# Patient Record
Sex: Female | Born: 1945 | Race: White | Hispanic: No | Smoking: Never smoker
Health system: Southern US, Community
[De-identification: ages and names within clinical notes are randomized; demographics above are authoritative.]

## PROBLEM LIST (undated history)

## (undated) DIAGNOSIS — F419 Anxiety disorder, unspecified: Secondary | ICD-10-CM

## (undated) DIAGNOSIS — D759 Disease of blood and blood-forming organs, unspecified: Secondary | ICD-10-CM

## (undated) DIAGNOSIS — Z9889 Other specified postprocedural states: Secondary | ICD-10-CM

## (undated) DIAGNOSIS — D539 Nutritional anemia, unspecified: Secondary | ICD-10-CM

## (undated) DIAGNOSIS — D61818 Other pancytopenia: Secondary | ICD-10-CM

## (undated) DIAGNOSIS — R5383 Other fatigue: Secondary | ICD-10-CM

## (undated) DIAGNOSIS — F329 Major depressive disorder, single episode, unspecified: Secondary | ICD-10-CM

## (undated) DIAGNOSIS — M1990 Unspecified osteoarthritis, unspecified site: Secondary | ICD-10-CM

## (undated) DIAGNOSIS — M179 Osteoarthritis of knee, unspecified: Secondary | ICD-10-CM

## (undated) DIAGNOSIS — M25569 Pain in unspecified knee: Secondary | ICD-10-CM

## (undated) DIAGNOSIS — I10 Essential (primary) hypertension: Secondary | ICD-10-CM

## (undated) DIAGNOSIS — R109 Unspecified abdominal pain: Secondary | ICD-10-CM

## (undated) DIAGNOSIS — M797 Fibromyalgia: Secondary | ICD-10-CM

## (undated) DIAGNOSIS — M549 Dorsalgia, unspecified: Secondary | ICD-10-CM

## (undated) DIAGNOSIS — E785 Hyperlipidemia, unspecified: Secondary | ICD-10-CM

## (undated) DIAGNOSIS — M1710 Unilateral primary osteoarthritis, unspecified knee: Secondary | ICD-10-CM

## (undated) DIAGNOSIS — R112 Nausea with vomiting, unspecified: Secondary | ICD-10-CM

## (undated) HISTORY — DX: Other pancytopenia: D61.818

## (undated) HISTORY — PX: COLONOSCOPY: SHX174

## (undated) HISTORY — DX: Hyperlipidemia, unspecified: E78.5

## (undated) HISTORY — DX: Major depressive disorder, single episode, unspecified: F32.9

## (undated) HISTORY — DX: Nutritional anemia, unspecified: D53.9

## (undated) HISTORY — DX: Dorsalgia, unspecified: M54.9

## (undated) HISTORY — DX: Osteoarthritis of knee, unspecified: M17.9

## (undated) HISTORY — PX: PAROTID GLAND TUMOR EXCISION: SHX5221

## (undated) HISTORY — DX: Essential (primary) hypertension: I10

## (undated) HISTORY — DX: Other fatigue: R53.83

## (undated) HISTORY — DX: Fibromyalgia: M79.7

## (undated) HISTORY — DX: Pain in unspecified knee: M25.569

## (undated) HISTORY — DX: Unspecified abdominal pain: R10.9

## (undated) HISTORY — DX: Unspecified osteoarthritis, unspecified site: M19.90

## (undated) HISTORY — PX: JOINT REPLACEMENT: SHX530

## (undated) HISTORY — DX: Unilateral primary osteoarthritis, unspecified knee: M17.10

---

## 1997-09-15 ENCOUNTER — Other Ambulatory Visit: Admission: RE | Admit: 1997-09-15 | Discharge: 1997-09-15 | Payer: Self-pay | Admitting: *Deleted

## 1997-12-31 ENCOUNTER — Other Ambulatory Visit: Admission: RE | Admit: 1997-12-31 | Discharge: 1997-12-31 | Payer: Self-pay | Admitting: *Deleted

## 1998-09-18 ENCOUNTER — Other Ambulatory Visit: Admission: RE | Admit: 1998-09-18 | Discharge: 1998-09-18 | Payer: Self-pay | Admitting: *Deleted

## 1999-09-24 ENCOUNTER — Other Ambulatory Visit: Admission: RE | Admit: 1999-09-24 | Discharge: 1999-09-24 | Payer: Self-pay | Admitting: *Deleted

## 2000-11-11 ENCOUNTER — Ambulatory Visit (HOSPITAL_COMMUNITY): Admission: RE | Admit: 2000-11-11 | Discharge: 2000-11-11 | Payer: Self-pay | Admitting: Emergency Medicine

## 2000-11-11 ENCOUNTER — Encounter: Payer: Self-pay | Admitting: Emergency Medicine

## 2000-11-29 ENCOUNTER — Other Ambulatory Visit: Admission: RE | Admit: 2000-11-29 | Discharge: 2000-11-29 | Payer: Self-pay | Admitting: *Deleted

## 2001-12-07 ENCOUNTER — Other Ambulatory Visit: Admission: RE | Admit: 2001-12-07 | Discharge: 2001-12-07 | Payer: Self-pay | Admitting: Obstetrics and Gynecology

## 2002-04-10 ENCOUNTER — Ambulatory Visit (HOSPITAL_COMMUNITY): Admission: RE | Admit: 2002-04-10 | Discharge: 2002-04-10 | Payer: Self-pay | Admitting: Gastroenterology

## 2002-04-10 ENCOUNTER — Encounter (INDEPENDENT_AMBULATORY_CARE_PROVIDER_SITE_OTHER): Payer: Self-pay | Admitting: Specialist

## 2002-12-26 ENCOUNTER — Other Ambulatory Visit: Admission: RE | Admit: 2002-12-26 | Discharge: 2002-12-26 | Payer: Self-pay | Admitting: Obstetrics & Gynecology

## 2004-08-04 ENCOUNTER — Encounter: Admission: RE | Admit: 2004-08-04 | Discharge: 2004-08-04 | Payer: Self-pay | Admitting: Neurology

## 2004-08-09 ENCOUNTER — Ambulatory Visit (HOSPITAL_COMMUNITY): Admission: RE | Admit: 2004-08-09 | Discharge: 2004-08-09 | Payer: Self-pay | Admitting: Neurology

## 2006-02-03 ENCOUNTER — Encounter: Admission: RE | Admit: 2006-02-03 | Discharge: 2006-02-03 | Payer: Self-pay | Admitting: Emergency Medicine

## 2008-06-08 ENCOUNTER — Emergency Department (HOSPITAL_COMMUNITY): Admission: EM | Admit: 2008-06-08 | Discharge: 2008-06-09 | Payer: Self-pay | Admitting: Emergency Medicine

## 2008-11-03 ENCOUNTER — Emergency Department (HOSPITAL_COMMUNITY): Admission: EM | Admit: 2008-11-03 | Discharge: 2008-11-03 | Payer: Self-pay | Admitting: Emergency Medicine

## 2010-04-22 LAB — URINALYSIS, ROUTINE W REFLEX MICROSCOPIC
Bilirubin Urine: NEGATIVE
Nitrite: POSITIVE — AB
Urobilinogen, UA: 0.2 mg/dL (ref 0.0–1.0)
pH: 6 (ref 5.0–8.0)

## 2010-04-22 LAB — CBC
HCT: 32.5 % — ABNORMAL LOW (ref 36.0–46.0)
MCHC: 34 g/dL (ref 30.0–36.0)
MCV: 92.8 fL (ref 78.0–100.0)
Platelets: 93 10*3/uL — ABNORMAL LOW (ref 150–400)
RBC: 3.5 MIL/uL — ABNORMAL LOW (ref 3.87–5.11)
RDW: 12.3 % (ref 11.5–15.5)
WBC: 2.8 10*3/uL — ABNORMAL LOW (ref 4.0–10.5)

## 2010-04-22 LAB — URINE MICROSCOPIC-ADD ON

## 2010-04-22 LAB — DIFFERENTIAL
Basophils Absolute: 0 10*3/uL (ref 0.0–0.1)
Basophils Relative: 1 % (ref 0–1)
Eosinophils Absolute: 0 10*3/uL (ref 0.0–0.7)
Eosinophils Relative: 1 % (ref 0–5)
Lymphocytes Relative: 8 % — ABNORMAL LOW (ref 12–46)
Monocytes Relative: 4 % (ref 3–12)
Neutro Abs: 2.5 10*3/uL (ref 1.7–7.7)
Neutrophils Relative %: 87 % — ABNORMAL HIGH (ref 43–77)

## 2010-04-22 LAB — BASIC METABOLIC PANEL
CO2: 23 mEq/L (ref 19–32)
Calcium: 8.8 mg/dL (ref 8.4–10.5)
GFR calc non Af Amer: >60 mL/min (ref >60)

## 2010-04-22 LAB — MAGNESIUM: Magnesium: 1.9 mg/dL (ref 1.5–2.5)

## 2010-04-27 LAB — URINALYSIS, ROUTINE W REFLEX MICROSCOPIC
Leukocytes, UA: NEGATIVE
Nitrite: NEGATIVE

## 2010-04-27 LAB — URINE MICROSCOPIC-ADD ON

## 2010-06-04 NOTE — Op Note (Signed)
   NAME:  Sandy Hodges, Sandy Hodges                        ACCOUNT NO.:  [id number]   MEDICAL RECORD NO.:  [id number]                   PATIENT TYPE:  AMB   LOCATION:  ENDO                                 FACILITY:  [facility]   PHYSICIAN:  John C. Madilyn Fireman, M.D.                 DATE OF BIRTH:  05/03/1945   DATE OF PROCEDURE:  04/10/2002  DATE OF DISCHARGE:                                 OPERATIVE REPORT   PROCEDURE:  Colonoscopy with polypectomy.   INDICATION FOR PROCEDURE:  Colon cancer screening as well as evaluation of  some left lower quadrant abdominal pain.   DESCRIPTION OF PROCEDURE:  The patient was placed in the left lateral  decubitus position and placed on the pulse monitor with continuous low-flow  oxygen delivered by nasal cannula.  She was sedated with 75 mcg IV fentanyl  and 8 mg IV Versed.  The Olympus video colonoscope was inserted into the  rectum and advanced to the cecum, confirmed by transillumination at  McBurney's point and visualization of the ileocecal valve and appendiceal  orifice.  The prep was excellent.  The cecum, ascending, transverse, and  descending colon all appeared normal with no masses, polyps, diverticula, or  other mucosal abnormalities.  The sigmoid colon appeared normal down to the  rectosigmoid junction to approximately 22 cm where there was a sessile 8 mm  polyp which was fulgurated by hot biopsy.  The rectum appeared normal, and  retroflexed view of the anus revealed significantly large internal  hemorrhoid that was prolapsing at the beginning of the procedure.  The scope  was then withdrawn, and the patient returned to the recovery room in stable  condition.  She tolerated the procedure well, and there were no immediate  complications.   IMPRESSION:  1. Rectosigmoid polyp.  2. Prolapsed internal hemorrhoid.   PLAN:  1. Treat hemorrhoid symptomatically.  2. Await histology to determine method and interval for future colon     screening.                                           John C. Madilyn Fireman, M.D.    JCH/MEDQ  D:  04/10/2002  T:  04/10/2002  Job:  [id number]   cc:   Oley Balm. Georgina Pillion, M.D.  [address]  [place]  [place] [address]  Fax: [phone]

## 2011-09-28 ENCOUNTER — Other Ambulatory Visit: Payer: Self-pay | Admitting: Gastroenterology

## 2011-12-14 ENCOUNTER — Other Ambulatory Visit: Payer: Self-pay | Admitting: Oncology

## 2011-12-14 ENCOUNTER — Telehealth: Payer: Self-pay | Admitting: Oncology

## 2011-12-14 ENCOUNTER — Encounter: Payer: Self-pay | Admitting: Oncology

## 2011-12-14 DIAGNOSIS — D61818 Other pancytopenia: Secondary | ICD-10-CM

## 2011-12-14 HISTORY — DX: Other pancytopenia: D61.818

## 2011-12-14 NOTE — Telephone Encounter (Signed)
S/W PT IN REF TO NP APPT. ON 12/21/11@9 :30 REFERRING DR Paulino Rily DX-PANCYTOPENIA  NO NEED TO MAIL NP PACKET (PER PT)

## 2011-12-14 NOTE — Telephone Encounter (Signed)
C/D 12/14/11 for appt.12/21/11

## 2011-12-21 ENCOUNTER — Ambulatory Visit (HOSPITAL_BASED_OUTPATIENT_CLINIC_OR_DEPARTMENT_OTHER): Payer: Medicare Other

## 2011-12-21 ENCOUNTER — Other Ambulatory Visit (HOSPITAL_BASED_OUTPATIENT_CLINIC_OR_DEPARTMENT_OTHER): Payer: Medicare Other | Admitting: Lab

## 2011-12-21 ENCOUNTER — Telehealth: Payer: Self-pay | Admitting: Oncology

## 2011-12-21 ENCOUNTER — Ambulatory Visit (HOSPITAL_BASED_OUTPATIENT_CLINIC_OR_DEPARTMENT_OTHER): Payer: Medicare Other | Admitting: Oncology

## 2011-12-21 VITALS — BP 144/61 | HR 44 | Temp 97.4°F | Resp 18 | Ht 65.5 in | Wt 177.5 lb

## 2011-12-21 DIAGNOSIS — D61818 Other pancytopenia: Secondary | ICD-10-CM

## 2011-12-21 LAB — MORPHOLOGY: PLT EST: ADEQUATE

## 2011-12-21 LAB — CBC & DIFF AND RETIC
EOS%: 10 % — ABNORMAL HIGH (ref 0.0–7.0)
Eosinophils Absolute: 0.5 10*3/uL (ref 0.0–0.5)
HGB: 11.6 g/dL (ref 11.6–15.9)
Immature Retic Fract: 11.4 % — ABNORMAL HIGH (ref 1.60–10.00)
LYMPH%: 30.5 % (ref 14.0–49.7)
MCH: 31.4 pg (ref 25.1–34.0)
MCHC: 32.8 g/dL (ref 31.5–36.0)
MONO%: 8.9 % (ref 0.0–14.0)
NEUT#: 2.3 10*3/uL (ref 1.5–6.5)
NEUT%: 49.9 % (ref 38.4–76.8)
Retic Ct Abs: 74.74 10*3/uL (ref 33.70–90.70)

## 2011-12-21 LAB — CHCC SMEAR

## 2011-12-21 NOTE — Telephone Encounter (Signed)
appts made and printed for pt,per dianne in the lab they do have all the labs needed for today.

## 2011-12-21 NOTE — Patient Instructions (Signed)
We will see you back and check counts again in 6 months on June 17,2014

## 2011-12-21 NOTE — Progress Notes (Signed)
New Patient Hematology-Oncology Evaluation   Sandy Hodges [id number] January 01, 1946 66 y.o. 12/21/2011  CC: Dr. [name]; Dr. Zenovia Jordan   Reason for referral:  Evaluate fluctuating pancytopenia   HPI:  Pleasant 65 year old woman who has been in overall good health. She has treated hypertension and hyperlipidemia. 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 She saw Dr. Zenovia Jordan in the past. She was the first one to record the mild pancytopenia. She was referred back to her primary care physician for further management. She was started on Naprosyn and Prilosec and is still on these medications. She reports joint stiffness in her hands and otherwise main joint affected by her arthritis is her right knee.  Blood counts done by Dr. Nickola Major 05/07/2009 hemoglobin 13.3 hematocrit 39.9 white count 3300 and platelets of 133,000. I found one other blood count from an emergency room visit on 11/03/2008 when white count was 2800 and platelets 93,000. All subsequent blood counts have been in better than this. Most recently counts dipped and on 08/18/2011 hemoglobin was 10.6 with a white count 3100 and a platelet count 148,000. She does not recall any acute illness at that time. Most recent counts done by Dr. Laurine Blazer on 10/31/2011 showed a hemoglobin of 12 MCV 96.5 white count 4000 and a platelet count 151,000. Followup counts on November 21 with hemoglobin 11 white count 3700 and platelets 141,000.  Blood counts in our office today with hemoglobin 11.6, hematocrit 35.4, MCV 95.7, white count 4600, 50% neutrophils, 31% lymphocytes, 9% monocytes, 10% eosinophils, platelet count 162,000. She also percent eosinophils on a white count differential done on 08/18/2011 1 total white count was 3100. Additional lab studies checked back in August with B12 306, methylmalonic acid normal at 181 (73-376), folic acid 18, serum iron  409, ferritin 31. Serum protein electrophoresis did not show a monoclonal spike.  She really has no signs or symptoms of a collagen vascular disorder and reports no hair loss, easy bruisability, skin sensitivity or rashes, easy bruising. She denies any paresthesias. She has not started any new medications recently. She has no history of exposure to toxic or organic chemicals, high-dose insecticides, or therapeutic radiation. Of interest her husband is under treatment for a myelodysplastic syndrome by one of my partners. There is no family history of any blood disorder. She has a sister age 16, a sister age 75, a brother age 67, who are healthy.   PMH: Hypertension; hyperlipidemia; no history of MI, asthma, emphysema, tuberculosis, stomach ulcers, hepatitis, yellow jaundice, mononucleosis, seizure, stroke, blood clots. No thyroid disease. Remote history of renal stones. History of recurrent urinary tract infection. Colonoscopy done 09/28/2011 by Dr. Dorena Cookey normal except for some benign polyps. Past Medical History  Diagnosis Date  . 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 Prior cataract surgery. No skin tumors removed.  Allergies: No Known Allergies  Medications: Aspirin 81 mg daily; atenolol 50 mg daily; Cardizem CD 240 mg daily; TriCor 145 mg daily; Zocor 20 mg daily; Naprosyn 500 mg twice a day; Prilosec 10 mg daily; Claritin 10 mg daily; fish oil capsules 1000 mg twice a day; vitamin D supplement 1000 units one daily; calcium 600 mg 1-1/2 tabs in the morning one in the evening.  Social History: She is married. Has been under treatment for MDS accompanies her today. They have 2 children both boys age 27 and 73 who are healthy. 6 grandchildren. No alcohol or tobacco.  Family History: See history of present illness  Review of  Systems: Constitutional symptoms: No constitutional symptoms HEENT: No sore throat  Respiratory: No cough or dyspnea Cardiovascular:  Palpitations in the past evaluated by cardiology and told benign. No chest pain Gastrointestinal ROS: No dysphagia, no change in bowel habit, Genito-Urinary ROS: See above Hematological and Lymphatic: See above Musculoskeletal: See above Neurologic: No chronic headache. No change in vision. Dermatologic: No rash, no easy bruising Remaining ROS negative.  Physical Exam: Blood pressure 144/61, pulse 44, temperature 97.4 F (36.3 C), temperature source Oral, resp. rate 18, height 5' 5.5" (1.664 m), weight 177 lb 8 oz (80.513 kg). Wt Readings from Last 3 Encounters:  12/21/11 177 lb 8 oz (80.513 kg)    General appearance: Well-nourished Caucasian woman Head: Normal Neck: Normal Lymph nodes: No lymphadenopathy Breasts: Not examined Lungs: Clear to auscultation resonant to percussion Heart: Regular rhythm, no murmur Abdominal: Soft, nontender, no mass, no organomegaly GU: Not examined Extremities: No edema, no calf tenderness, no major joint deformities Neurologic: Alert and oriented, PERRLA, optic disc sharp, vessels normal, motor strength 5 over 5, reflexes 1+ symmetric, vibration sense is mild to moderately decreased over the fingertips by tuning fork exam Skin: No rash or ecchymosis    Lab Results: Lab Results: White count differential : 50% neutrophils, 30% lymphocytes, 9% monocytes, 10% eosinophils   Component Value Date   WBC 4.6 12/21/2011   HGB 11.6 12/21/2011   HCT 35.4 12/21/2011   MCV 95.7 12/21/2011   PLT 162 12/21/2011     Chemistry      Component Value Date/Time   NA 139 11/03/2008 1426   K 3.2* 11/03/2008 1426   CL 111 11/03/2008 1426   CO2 23 11/03/2008 1426   BUN 13 11/03/2008 1426   CREATININE 0.67 11/03/2008 1426      Component Value Date/Time   CALCIUM 8.8 11/03/2008 1426       Review of peripheral blood film:  Normochromic normocytic red cells. Mature neutrophils and platelets.    Impression and Plan: Mild, fluctuating, pancytopenia with no trend for progressive deterioration in counts over a 3 year interval of observation. She has a mild eosinophilia. An allergic reaction to one of her medications would be a consideration.  She does have a history of what she was told is mild inflammatory arthritis which could also affect her blood counts on an immune basis if this is true. This being said, her blood counts today are among the best recorded in the last 3 years. I'm going to check a rheumatoid factor and ANA today  Myeloma screen is negative. I am repeating immunoglobulin studies with immunofixation electrophoresis today.  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 She has borderline macrocytic indices. She does not have a monocytosis. However given stability of the counts over time, I see no indication to do a bone marrow biopsy at this time.  Recommendation: Observation alone at this point. I will see her back in 6 months and check lab at that time. We will call her with the outstanding lab results from today's visit.      Levert Feinstein, MD 12/21/2011, 11:11 AM

## 2011-12-23 LAB — SEDIMENTATION RATE: Sed Rate: 4 mm/hr (ref 0–22)

## 2011-12-23 LAB — ANA: Anti Nuclear Antibody(ANA): NEGATIVE

## 2011-12-23 LAB — IMMUNOFIXATION ELECTROPHORESIS: IgM, Serum: 54 mg/dL (ref 52–322)

## 2012-06-28 ENCOUNTER — Other Ambulatory Visit (HOSPITAL_BASED_OUTPATIENT_CLINIC_OR_DEPARTMENT_OTHER): Payer: Medicare Other | Admitting: Lab

## 2012-06-28 DIAGNOSIS — D61818 Other pancytopenia: Secondary | ICD-10-CM

## 2012-06-28 LAB — CBC & DIFF AND RETIC
Basophils Absolute: 0 10*3/uL (ref 0.0–0.1)
EOS%: 11.1 % — ABNORMAL HIGH (ref 0.0–7.0)
HGB: 11.3 g/dL — ABNORMAL LOW (ref 11.6–15.9)
MCH: 29.4 pg (ref 25.1–34.0)
NEUT#: 1.5 10*3/uL (ref 1.5–6.5)
RDW: 13.6 % (ref 11.2–14.5)
Retic %: 2.22 % — ABNORMAL HIGH (ref 0.70–2.10)
Retic Ct Abs: 85.47 10*3/uL (ref 33.70–90.70)
WBC: 3.1 10*3/uL — ABNORMAL LOW (ref 3.9–10.3)
lymph#: 1.1 10*3/uL (ref 0.9–3.3)

## 2012-06-28 LAB — MORPHOLOGY

## 2012-06-28 LAB — COMPREHENSIVE METABOLIC PANEL (CC13)
AST: 17 U/L (ref 5–34)
Alkaline Phosphatase: 37 U/L — ABNORMAL LOW (ref 40–150)
BUN: 21 mg/dL (ref 7.0–26.0)
Creatinine: 1 mg/dL (ref 0.6–1.1)
Potassium: 3.9 mEq/L (ref 3.5–5.1)

## 2012-06-28 LAB — LACTATE DEHYDROGENASE (CC13): LDH: 189 U/L (ref 125–245)

## 2012-06-28 LAB — CHCC SMEAR

## 2012-07-02 ENCOUNTER — Ambulatory Visit: Payer: Medicare Other | Admitting: Nurse Practitioner

## 2012-07-02 LAB — IMMUNOFIXATION ELECTROPHORESIS
IgA: 145 mg/dL (ref 69–380)
IgG (Immunoglobin G), Serum: 769 mg/dL (ref 690–1700)

## 2012-07-02 LAB — SEDIMENTATION RATE: Sed Rate: 1 mm/hr (ref 0–22)

## 2012-07-02 LAB — ANA: Anti Nuclear Antibody(ANA): NEGATIVE

## 2012-07-09 ENCOUNTER — Telehealth: Payer: Self-pay | Admitting: Oncology

## 2012-07-09 ENCOUNTER — Ambulatory Visit (HOSPITAL_BASED_OUTPATIENT_CLINIC_OR_DEPARTMENT_OTHER): Payer: Medicare Other | Admitting: Nurse Practitioner

## 2012-07-09 VITALS — BP 186/85 | HR 70 | Temp 97.8°F | Resp 20 | Ht 65.5 in | Wt 174.1 lb

## 2012-07-09 DIAGNOSIS — D61818 Other pancytopenia: Secondary | ICD-10-CM

## 2012-07-09 NOTE — Progress Notes (Signed)
OFFICE PROGRESS NOTE  Interval history:  Ms. Scroggins returns as scheduled. To review, she is a 67 year old woman seen by Dr. Cyndie Chime in an initial visit on 12/21/2011 for evaluation of fluctuating pancytopenia. Labs at that time showed a hemoglobin of 11.6, white count 4.6, absolute neutrophil count 2.3, mild eosinophilia at 10%, platelet count 162,000; reticulocyte percent 2.02; rheumatoid factor less than 10; ESR normal at 4; quantitative immunoglobulins all within normal range with immunofixation negative for monoclonal protein; ANA negative.  Given the stability of the blood counts over time, Dr. Cyndie Chime recommended observation alone.  She feels well. No interim illnesses or infections. She has a good energy level. Her appetite is "great". No fevers or sweats. No bowel or bladder problems. No bleeding.   Objective: Blood pressure 186/85, pulse 70, temperature 97.8 F (36.6 C), temperature source Oral, resp. rate 20, height 5' 5.5" (1.664 m), weight 174 lb 1.6 oz (78.971 kg).  Oropharynx is without thrush or ulceration. No palpable cervical, supraclavicular or axillary lymph nodes. Lungs are clear. Regular cardiac rhythm. Abdomen is soft and nontender. No organomegaly. Extremities are without edema.  Lab Results: Lab Results  Component Value Date   WBC 3.1* 06/28/2012   HGB 11.3* 06/28/2012   HCT 36.1 06/28/2012   MCV 93.8 06/28/2012   PLT 152 06/28/2012    Chemistry:    Chemistry      Component Value Date/Time   NA 142 06/28/2012 0807   NA 139 11/03/2008 1426   K 3.9 06/28/2012 0807   K 3.2* 11/03/2008 1426   CL 110* 06/28/2012 0807   CL 111 11/03/2008 1426   CO2 23 06/28/2012 0807   CO2 23 11/03/2008 1426   BUN 21.0 06/28/2012 0807   BUN 13 11/03/2008 1426   CREATININE 1.0 06/28/2012 0807   CREATININE 0.67 11/03/2008 1426      Component Value Date/Time   CALCIUM 10.0 06/28/2012 0807   CALCIUM 8.8 11/03/2008 1426   ALKPHOS 37* 06/28/2012 0807   AST 17 06/28/2012 0807   ALT 14 06/28/2012 0807   BILITOT 0.39 06/28/2012 0807       Studies/Results: No results found.  Medications: I have reviewed the patient's current medications. Diltiazem was recently discontinued. She plans on contacting her cardiologist with blood pressure readings since the diltiazem was stopped.  Assessment/Plan:  1. Mild fluctuating pancytopenia with counts remaining stable as compared to greater than 3-1/2 years ago. 2. Mild eosinophilia. Stable. 3. History of mild inflammatory arthritis. 4. Hypertension. 5. Hyperlipidemia.  Disposition-Ms. Koffman remains stable from a hematologic standpoint. Dr. Cyndie Chime recommends continued observation alone. She will return for labs and a followup visit in one year.  Lonna Cobb ANP/GNP-BC    CC Dr. [name], Dr. Zenovia Jordan.

## 2012-07-09 NOTE — Telephone Encounter (Signed)
gv and printed appt sched and avs for pt  °

## 2013-03-18 ENCOUNTER — Encounter: Payer: Self-pay | Admitting: Oncology

## 2013-06-12 ENCOUNTER — Telehealth: Payer: Self-pay | Admitting: *Deleted

## 2013-06-12 NOTE — Telephone Encounter (Signed)
Pt called to make sure her labs were sent to Dr. Bertis Ruddy from her PCP.  Informed pt we did receive labs and office visit from [place] [facility]. Placed on Dr. Bertis Ruddy desk for review.  Pt confirmed appt w/ Dr. Bertis Ruddy on 6/23.

## 2013-07-02 ENCOUNTER — Other Ambulatory Visit: Payer: Medicare Other

## 2013-07-09 ENCOUNTER — Ambulatory Visit: Payer: Medicare Other | Admitting: Oncology

## 2013-07-09 ENCOUNTER — Ambulatory Visit (HOSPITAL_BASED_OUTPATIENT_CLINIC_OR_DEPARTMENT_OTHER): Payer: Medicare Other

## 2013-07-09 ENCOUNTER — Ambulatory Visit (HOSPITAL_BASED_OUTPATIENT_CLINIC_OR_DEPARTMENT_OTHER): Payer: Medicare Other | Admitting: Hematology and Oncology

## 2013-07-09 ENCOUNTER — Telehealth: Payer: Self-pay | Admitting: Hematology and Oncology

## 2013-07-09 ENCOUNTER — Encounter: Payer: Self-pay | Admitting: Hematology and Oncology

## 2013-07-09 ENCOUNTER — Other Ambulatory Visit: Payer: Medicare Other | Admitting: Lab

## 2013-07-09 VITALS — BP 150/79 | HR 64 | Temp 97.5°F | Ht 65.5 in | Wt 169.0 lb

## 2013-07-09 DIAGNOSIS — M549 Dorsalgia, unspecified: Secondary | ICD-10-CM

## 2013-07-09 DIAGNOSIS — M25569 Pain in unspecified knee: Secondary | ICD-10-CM | POA: Insufficient documentation

## 2013-07-09 DIAGNOSIS — D539 Nutritional anemia, unspecified: Secondary | ICD-10-CM

## 2013-07-09 DIAGNOSIS — R109 Unspecified abdominal pain: Secondary | ICD-10-CM

## 2013-07-09 DIAGNOSIS — M25561 Pain in right knee: Secondary | ICD-10-CM

## 2013-07-09 DIAGNOSIS — D72819 Decreased white blood cell count, unspecified: Secondary | ICD-10-CM

## 2013-07-09 DIAGNOSIS — D61818 Other pancytopenia: Secondary | ICD-10-CM

## 2013-07-09 DIAGNOSIS — M545 Low back pain, unspecified: Secondary | ICD-10-CM

## 2013-07-09 DIAGNOSIS — R5383 Other fatigue: Secondary | ICD-10-CM

## 2013-07-09 DIAGNOSIS — N390 Urinary tract infection, site not specified: Secondary | ICD-10-CM

## 2013-07-09 DIAGNOSIS — M25562 Pain in left knee: Secondary | ICD-10-CM

## 2013-07-09 HISTORY — DX: Other fatigue: R53.83

## 2013-07-09 HISTORY — DX: Nutritional anemia, unspecified: D53.9

## 2013-07-09 HISTORY — DX: Pain in unspecified knee: M25.569

## 2013-07-09 HISTORY — DX: Unspecified abdominal pain: R10.9

## 2013-07-09 HISTORY — DX: Dorsalgia, unspecified: M54.9

## 2013-07-09 LAB — CHCC SMEAR

## 2013-07-09 LAB — COMPREHENSIVE METABOLIC PANEL (CC13)
ALT: 13 U/L (ref 0–55)
ANION GAP: 7 meq/L (ref 3–11)
AST: 21 U/L (ref 5–34)
Albumin: 3.7 g/dL (ref 3.5–5.0)
Alkaline Phosphatase: 32 U/L — ABNORMAL LOW (ref 40–150)
BUN: 21.6 mg/dL (ref 7.0–26.0)
CHLORIDE: 111 meq/L — AB (ref 98–109)
CO2: 25 meq/L (ref 22–29)
Calcium: 9.8 mg/dL (ref 8.4–10.4)
Creatinine: 0.9 mg/dL (ref 0.6–1.1)
GLUCOSE: 91 mg/dL (ref 70–140)
Potassium: 4.7 mEq/L (ref 3.5–5.1)
Sodium: 143 mEq/L (ref 136–145)
Total Bilirubin: 0.31 mg/dL (ref 0.20–1.20)
Total Protein: 6.6 g/dL (ref 6.4–8.3)

## 2013-07-09 LAB — CBC & DIFF AND RETIC
BASO%: 0.9 % (ref 0.0–2.0)
BASOS ABS: 0 10*3/uL (ref 0.0–0.1)
EOS%: 9.9 % — ABNORMAL HIGH (ref 0.0–7.0)
Eosinophils Absolute: 0.3 10*3/uL (ref 0.0–0.5)
HEMATOCRIT: 33.6 % — AB (ref 34.8–46.6)
HEMOGLOBIN: 10.2 g/dL — AB (ref 11.6–15.9)
IMMATURE RETIC FRACT: 17.2 % — AB (ref 1.60–10.00)
LYMPH#: 0.9 10*3/uL (ref 0.9–3.3)
LYMPH%: 27.3 % (ref 14.0–49.7)
MCH: 27.3 pg (ref 25.1–34.0)
MCHC: 30.4 g/dL — AB (ref 31.5–36.0)
MCV: 90.1 fL (ref 79.5–101.0)
MONO#: 0.3 10*3/uL (ref 0.1–0.9)
MONO%: 9.9 % (ref 0.0–14.0)
NEUT#: 1.7 10*3/uL (ref 1.5–6.5)
NEUT%: 52 % (ref 38.4–76.8)
PLATELETS: 138 10*3/uL — AB (ref 145–400)
RBC: 3.73 10*6/uL (ref 3.70–5.45)
RDW: 15 % — ABNORMAL HIGH (ref 11.2–14.5)
Retic %: 2.05 % (ref 0.70–2.10)
Retic Ct Abs: 76.47 10*3/uL (ref 33.70–90.70)
WBC: 3.2 10*3/uL — ABNORMAL LOW (ref 3.9–10.3)

## 2013-07-09 LAB — URINALYSIS, MICROSCOPIC - CHCC
Bilirubin (Urine): NEGATIVE
Blood: NEGATIVE
Glucose: NEGATIVE mg/dL
Ketones: NEGATIVE mg/dL
Leukocyte Esterase: NEGATIVE
Nitrite: NEGATIVE
Protein: NEGATIVE mg/dL
Specific Gravity, Urine: 1.02 (ref 1.003–1.035)
Urobilinogen, UR: 0.2 mg/dL (ref 0.2–1)
pH: 6 (ref 4.6–8.0)

## 2013-07-09 LAB — IRON AND TIBC CHCC
%SAT: 17 % — AB (ref 21–57)
Iron: 80 ug/dL (ref 41–142)
TIBC: 463 ug/dL — ABNORMAL HIGH (ref 236–444)
UIBC: 383 ug/dL (ref 120–384)

## 2013-07-09 LAB — T4, FREE: Free T4: 1.11 ng/dL (ref 0.80–1.80)

## 2013-07-09 LAB — FERRITIN CHCC: Ferritin: 10 ng/ml (ref 9–269)

## 2013-07-09 LAB — TSH CHCC: TSH: 0.574 m[IU]/L (ref 0.308–3.960)

## 2013-07-09 LAB — VITAMIN B12: Vitamin B-12: 507 pg/mL (ref 211–911)

## 2013-07-09 NOTE — Telephone Encounter (Signed)
Gave pt apt for labs,Md Ct,and sent pt to labs today, [facility]

## 2013-07-09 NOTE — Progress Notes (Signed)
[facility] FOLLOW-UP progress notes  Patient Care Team: Emeterio ReeveSharon A Wolters, MD as PCP - General (Family Medicine)  CHIEF COMPLAINTS/PURPOSE OF VISIT:  Chronic anemia, leukopenia, abnormal MRI  HISTORY OF PRESENTING ILLNESS:  Sandy Hodges 68 y.o. female was transferred to my care after her prior physician has left.  I reviewed the patient's records extensive and collaborated the history with the patient. Summary of her history is as follows: This patient had extensive evaluation 2 years ago for fluctuation of her blood work up with chronic leukopenia and anemia. She also had abnormal CT scan and MRI of the back. She complained of severe arthritis/bone pain in her mid back which correlated with the abnormal changes seen on MRI several years ago. She also have severe pain in both knees and her right tibia. She also has fatigue and lack of energy and chronic cramps.  MEDICAL HISTORY:  Past Medical History  Diagnosis Date  . Pancytopenia 12/14/2011  . Hyperlipidemia   . Unspecified deficiency anemia 07/09/2013  . Fatigue 07/09/2013  . Knee pain 07/09/2013  . Back pain 07/09/2013  . Abdominal pain, unspecified site 07/09/2013    SURGICAL HISTORY: Past Surgical History  Procedure Laterality Date  . Colonoscopy      SOCIAL HISTORY: History   Social History  . Marital Status: Single    Spouse Name: N/A    Number of Children: N/A  . Years of Education: N/A   Occupational History  . Not on file.   Social History Main Topics  . Smoking status: Never Smoker   . Smokeless tobacco: Never Used  . Alcohol Use: No  . Drug Use: No  . Sexual Activity: Not on file   Other Topics Concern  . Not on file   Social History Narrative  . No narrative on file    FAMILY HISTORY: Family History  Problem Relation Age of Onset  . Cancer Mother     breast ca    ALLERGIES:  has No Known Allergies.  MEDICATIONS:  Current Outpatient Prescriptions  Medication Sig Dispense  Refill  . ALPRAZolam (XANAX) 0.25 MG tablet       . aspirin 81 MG tablet Take 81 mg by mouth daily.      Marland Kitchen. atenolol (TENORMIN) 50 MG tablet Take 1 tablet by mouth Daily.      . calcium carbonate (OS-CAL) 600 MG TABS Take 600 mg by mouth 2 (two) times daily with a meal. Takes 1 & 1/2 tab in am & 1 tab in eve.      . Cholecalciferol (VITAMIN D-3) 1000 UNITS CAPS Take 1 capsule by mouth daily.      . fenofibrate (TRICOR) 145 MG tablet Take 1 tablet by mouth Daily.      . fish oil-omega-3 fatty acids 1000 MG capsule Take 1 g by mouth 2 (two) times daily.      Marland Kitchen. loratadine (CLARITIN) 10 MG tablet Take 10 mg by mouth daily.      Marland Kitchen. omeprazole (PRILOSEC) 10 MG capsule Take 10 mg by mouth daily.      . simvastatin (ZOCOR) 20 MG tablet Take 1 tablet by mouth Daily.      . naproxen (NAPROSYN) 500 MG tablet Take 1 tablet by mouth Twice daily.       No current facility-administered medications for this visit.    REVIEW OF SYSTEMS:   Constitutional: Denies fevers, chills or abnormal night sweats Eyes: Denies blurriness of vision, double vision or watery eyes  Ears, nose, mouth, throat, and face: Denies mucositis or sore throat Respiratory: Denies cough, dyspnea or wheezes Cardiovascular: Denies palpitation, chest discomfort or lower extremity swelling Gastrointestinal:  Denies nausea, heartburn or change in bowel habits Skin: Denies abnormal skin rashes Lymphatics: Denies new lymphadenopathy or easy bruising Neurological:Denies numbness, tingling or new weaknesses Behavioral/Psych: Mood is stable, no new changes  All other systems were reviewed with the patient and are negative.  PHYSICAL EXAMINATION: ECOG PERFORMANCE STATUS: 1 - Symptomatic but completely ambulatory  Filed Vitals:   07/09/13 0923  BP: 150/79  Pulse: 64  Temp: 97.5 F (36.4 C)   Filed Weights   07/09/13 0923  Weight: 169 lb (76.658 kg)    GENERAL:alert, no distress and comfortable SKIN: skin color, texture, turgor are  normal, no rashes or significant lesions EYES: normal, conjunctiva are pink and non-injected, sclera clear OROPHARYNX:no exudate, normal lips, buccal mucosa, and tongue  NECK: supple, thyroid normal size, non-tender, without nodularity LYMPH:  no palpable lymphadenopathy in the cervical, axillary or inguinal LUNGS: clear to auscultation and percussion with normal breathing effort HEART: regular rate & rhythm and no murmurs without lower extremity edema ABDOMEN:abdomen soft, non-tender and normal bowel sounds. Mild tenderness on the left flank area Musculoskeletal:no cyanosis of digits and no clubbing . Degenerative joint changes in both knees and a soft tissue mass on the medial part of her upper tibia. PSYCH: alert & oriented x 3 with fluent speech NEURO: no focal motor/sensory deficits  LABORATORY DATA:  I have reviewed the data as listed Lab Results  Component Value Date   WBC 3.2* 07/09/2013   HGB 10.2* 07/09/2013   HCT 33.6* 07/09/2013   MCV 90.1 07/09/2013   PLT 138* 07/09/2013    Recent Labs  07/09/13 1035  NA 143  K 4.7  CO2 25  GLUCOSE 91  BUN 21.6  CREATININE 0.9  CALCIUM 9.8  PROT 6.6  ALBUMIN 3.7  AST 21  ALT 13  ALKPHOS 32*  BILITOT 0.31    RADIOGRAPHIC STUDIES: I reviewed her prior MRI and CT scan with the patient and husband I have personally reviewed the radiological images as listed and agreed with the findings in the report.   ASSESSMENT & PLAN:  Unspecified deficiency anemia She has chronic anemia for long time. I will start back another workup with reticulocyte count, smear, iron studies as such her to rule out nutritional deficiencies. I suspect that is a component of anemia chronic disease.  Leukopenia Because of leukopenia is unknown. Screening for autoimmune disease was negative. I will order additional workup  Flank pain She has recurrent flank pain and urinary tract infection. Review of her last CT scan show unknown cause of  hydronephrosis. I would recheck urinalysis and CT scan for further evaluation.  Back pain A review of her prior MRI of the thoracic spine and it was abnormal. Subsequent bone scan is also abnormal. I will repeat MRI to exclude cancer that would be causing her back pain.  Knee pain She has degenerative joint changes. I will start with x-ray of both knees. There is also a palpable soft tissue abnormalities on the tibia on the right side and I will order x-ray for that too.   Orders Placed This Encounter  Procedures  . Urine culture    Standing Status: Future     Number of Occurrences: 1     Standing Expiration Date: 07/09/2014  . CT Abdomen Pelvis W Contrast    Standing Status: Future  Number of Occurrences:      Standing Expiration Date: 10/09/2014    Order Specific Question:  Reason for Exam (SYMPTOM  OR DIAGNOSIS REQUIRED)    Answer:  left flank pain, recurrent UTI, left hydronephrosis, exclude mass    Order Specific Question:  Preferred imaging location?    Answer:  [facility]  . MR Thoracic Spine W Contrast    Standing Status: Future     Number of Occurrences:      Standing Expiration Date: 07/09/2014    Order Specific Question:  Reason for Exam (SYMPTOM  OR DIAGNOSIS REQUIRED)    Answer:  back pain, abnormal MRI    Order Specific Question:  Preferred imaging location?    Answer:  [facility]    Order Specific Question:  Does the patient have a pacemaker or implanted devices?    Answer:  No    Order Specific Question:  What is the patient's sedation requirement?    Answer:  No Sedation  . DG Tibia/Fibula Right    Standing Status: Future     Number of Occurrences:      Standing Expiration Date: 09/09/2014    Order Specific Question:  Reason for Exam (SYMPTOM  OR DIAGNOSIS REQUIRED)    Answer:  right tibia pain    Order Specific Question:  Preferred imaging location?    Answer:  [facility]  . DG Knee Complete 4 Views Right    Standing  Status: Future     Number of Occurrences:      Standing Expiration Date: 09/09/2014    Order Specific Question:  Reason for Exam (SYMPTOM  OR DIAGNOSIS REQUIRED)    Answer:  right knee pain    Order Specific Question:  Preferred imaging location?    Answer:  [facility]  . DG Knee Complete 4 Views Left    Standing Status: Future     Number of Occurrences:      Standing Expiration Date: 09/09/2014    Order Specific Question:  Reason for Exam (SYMPTOM  OR DIAGNOSIS REQUIRED)    Answer:  left knee pain    Order Specific Question:  Preferred imaging location?    Answer:  [facility]  . CBC & Diff and Retic    Standing Status: Future     Number of Occurrences: 1     Standing Expiration Date: 07/09/2014  . Comprehensive metabolic panel    Standing Status: Future     Number of Occurrences: 1     Standing Expiration Date: 07/09/2014  . TSH    Standing Status: Future     Number of Occurrences: 1     Standing Expiration Date: 07/09/2014  . T4, free    Standing Status: Future     Number of Occurrences: 1     Standing Expiration Date: 07/09/2014  . Vitamin B12    Standing Status: Future     Number of Occurrences: 1     Standing Expiration Date: 07/09/2014  . Smear    Standing Status: Future     Number of Occurrences: 1     Standing Expiration Date: 07/09/2014  . Iron and TIBC    Standing Status: Future     Number of Occurrences: 1     Standing Expiration Date: 07/09/2014  . Ferritin    Standing Status: Future     Number of Occurrences: 1     Standing Expiration Date: 07/09/2014  . Urinalysis, Microscopic - [facility]  Standing Status: Future     Number of Occurrences: 1     Standing Expiration Date: 07/09/2014    All questions were answered. The patient knows to call the clinic with any problems, questions or concerns.    Kennedy Kreiger InstituteGORSUCH, NI, MD 07/09/2013 3:28 PM

## 2013-07-09 NOTE — Assessment & Plan Note (Signed)
Because of leukopenia is unknown. Screening for autoimmune disease was negative. I will order additional workup

## 2013-07-09 NOTE — Assessment & Plan Note (Signed)
She has chronic anemia for long time. I will start back another workup with reticulocyte count, smear, iron studies as such her to rule out nutritional deficiencies. I suspect that is a component of anemia chronic disease.

## 2013-07-09 NOTE — Assessment & Plan Note (Signed)
She has recurrent flank pain and urinary tract infection. Review of her last CT scan show unknown cause of hydronephrosis. I would recheck urinalysis and CT scan for further evaluation.

## 2013-07-09 NOTE — Assessment & Plan Note (Signed)
A review of her prior MRI of the thoracic spine and it was abnormal. Subsequent bone scan is also abnormal. I will repeat MRI to exclude cancer that would be causing her back pain.

## 2013-07-09 NOTE — Assessment & Plan Note (Signed)
She has degenerative joint changes. I will start with x-ray of both knees. There is also a palpable soft tissue abnormalities on the tibia on the right side and I will order x-ray for that too.

## 2013-07-10 ENCOUNTER — Ambulatory Visit (HOSPITAL_COMMUNITY)
Admission: RE | Admit: 2013-07-10 | Discharge: 2013-07-10 | Disposition: A | Discharge status: Home / Self Care | Payer: Medicare Other | Source: Ambulatory Visit | Attending: Hematology and Oncology | Admitting: Hematology and Oncology

## 2013-07-10 ENCOUNTER — Other Ambulatory Visit: Payer: Self-pay | Admitting: Hematology and Oncology

## 2013-07-10 ENCOUNTER — Encounter (HOSPITAL_COMMUNITY): Payer: Self-pay

## 2013-07-10 DIAGNOSIS — R109 Unspecified abdominal pain: Secondary | ICD-10-CM | POA: Insufficient documentation

## 2013-07-10 DIAGNOSIS — N1330 Unspecified hydronephrosis: Secondary | ICD-10-CM | POA: Insufficient documentation

## 2013-07-10 DIAGNOSIS — M25569 Pain in unspecified knee: Secondary | ICD-10-CM | POA: Insufficient documentation

## 2013-07-10 DIAGNOSIS — M259 Joint disorder, unspecified: Secondary | ICD-10-CM | POA: Insufficient documentation

## 2013-07-10 DIAGNOSIS — M79609 Pain in unspecified limb: Secondary | ICD-10-CM | POA: Insufficient documentation

## 2013-07-10 DIAGNOSIS — I517 Cardiomegaly: Secondary | ICD-10-CM | POA: Insufficient documentation

## 2013-07-10 DIAGNOSIS — M1610 Unilateral primary osteoarthritis, unspecified hip: Secondary | ICD-10-CM | POA: Insufficient documentation

## 2013-07-10 DIAGNOSIS — I2584 Coronary atherosclerosis due to calcified coronary lesion: Secondary | ICD-10-CM | POA: Insufficient documentation

## 2013-07-10 DIAGNOSIS — M25562 Pain in left knee: Principal | ICD-10-CM

## 2013-07-10 DIAGNOSIS — M169 Osteoarthritis of hip, unspecified: Secondary | ICD-10-CM | POA: Insufficient documentation

## 2013-07-10 DIAGNOSIS — M2340 Loose body in knee, unspecified knee: Secondary | ICD-10-CM | POA: Insufficient documentation

## 2013-07-10 DIAGNOSIS — Z87440 Personal history of urinary (tract) infections: Secondary | ICD-10-CM | POA: Insufficient documentation

## 2013-07-10 DIAGNOSIS — M25561 Pain in right knee: Secondary | ICD-10-CM

## 2013-07-10 LAB — URINE CULTURE

## 2013-07-10 MED ORDER — IOHEXOL 300 MG/ML  SOLN
100.0000 mL | Freq: Once | INTRAMUSCULAR | Status: AC | PRN
  Administered 2013-07-10: 100 mL via INTRAVENOUS

## 2013-07-16 ENCOUNTER — Ambulatory Visit (HOSPITAL_COMMUNITY): Admission: RE | Admit: 2013-07-16 | Payer: Medicare Other | Source: Ambulatory Visit

## 2013-07-17 ENCOUNTER — Other Ambulatory Visit: Payer: Self-pay | Admitting: Hematology and Oncology

## 2013-07-17 ENCOUNTER — Ambulatory Visit (HOSPITAL_COMMUNITY)
Admission: RE | Admit: 2013-07-17 | Discharge: 2013-07-17 | Disposition: A | Discharge status: Home / Self Care | Payer: Medicare Other | Source: Ambulatory Visit | Attending: Hematology and Oncology | Admitting: Hematology and Oncology

## 2013-07-17 DIAGNOSIS — M545 Low back pain, unspecified: Secondary | ICD-10-CM

## 2013-07-17 DIAGNOSIS — M79609 Pain in unspecified limb: Secondary | ICD-10-CM | POA: Insufficient documentation

## 2013-07-17 DIAGNOSIS — M4000 Postural kyphosis, site unspecified: Secondary | ICD-10-CM | POA: Insufficient documentation

## 2013-07-17 DIAGNOSIS — IMO0002 Reserved for concepts with insufficient information to code with codable children: Secondary | ICD-10-CM | POA: Insufficient documentation

## 2013-07-17 DIAGNOSIS — M5124 Other intervertebral disc displacement, thoracic region: Secondary | ICD-10-CM | POA: Insufficient documentation

## 2013-07-17 DIAGNOSIS — M549 Dorsalgia, unspecified: Secondary | ICD-10-CM | POA: Insufficient documentation

## 2013-07-17 MED ORDER — GADOBENATE DIMEGLUMINE 529 MG/ML IV SOLN
15.0000 mL | Freq: Once | INTRAVENOUS | Status: AC | PRN
  Administered 2013-07-17: 15 mL via INTRAVENOUS

## 2013-07-18 ENCOUNTER — Telehealth: Payer: Self-pay | Admitting: *Deleted

## 2013-07-18 NOTE — Telephone Encounter (Signed)
Informed pt of MRI negative for any cancer per Dr. Bertis RuddyGorsuch.  She verbalized understanding.

## 2013-07-24 ENCOUNTER — Telehealth: Payer: Self-pay | Admitting: *Deleted

## 2013-07-24 NOTE — Telephone Encounter (Signed)
Informed pt of her and her husband's appts times moved from 8:30 am to 9:45 am on 7/16 per Dr. Bertis RuddyGorsuch.  She verbalized understanding.  POFs sent.

## 2013-07-25 ENCOUNTER — Other Ambulatory Visit: Payer: Medicare Other

## 2013-07-25 ENCOUNTER — Other Ambulatory Visit: Payer: Self-pay | Admitting: Hematology and Oncology

## 2013-07-25 ENCOUNTER — Telehealth: Payer: Self-pay | Admitting: Hematology and Oncology

## 2013-07-25 NOTE — Telephone Encounter (Signed)
s.w. pt and advised that MD did not need for her to have labs today...pt ok and aware she only had MD visit.

## 2013-08-01 ENCOUNTER — Telehealth: Payer: Self-pay | Admitting: Hematology and Oncology

## 2013-08-01 ENCOUNTER — Ambulatory Visit (HOSPITAL_BASED_OUTPATIENT_CLINIC_OR_DEPARTMENT_OTHER): Payer: Medicare Other | Admitting: Hematology and Oncology

## 2013-08-01 ENCOUNTER — Encounter: Payer: Self-pay | Admitting: Hematology and Oncology

## 2013-08-01 VITALS — BP 158/78 | HR 73 | Temp 97.7°F | Resp 18 | Ht 65.0 in | Wt 169.4 lb

## 2013-08-01 DIAGNOSIS — M549 Dorsalgia, unspecified: Secondary | ICD-10-CM

## 2013-08-01 DIAGNOSIS — D72819 Decreased white blood cell count, unspecified: Secondary | ICD-10-CM

## 2013-08-01 DIAGNOSIS — D61818 Other pancytopenia: Secondary | ICD-10-CM

## 2013-08-01 DIAGNOSIS — R109 Unspecified abdominal pain: Secondary | ICD-10-CM

## 2013-08-01 DIAGNOSIS — D539 Nutritional anemia, unspecified: Secondary | ICD-10-CM

## 2013-08-01 DIAGNOSIS — M25569 Pain in unspecified knee: Secondary | ICD-10-CM

## 2013-08-01 DIAGNOSIS — N858 Other specified noninflammatory disorders of uterus: Secondary | ICD-10-CM

## 2013-08-01 DIAGNOSIS — N9489 Other specified conditions associated with female genital organs and menstrual cycle: Secondary | ICD-10-CM

## 2013-08-01 DIAGNOSIS — M546 Pain in thoracic spine: Secondary | ICD-10-CM

## 2013-08-01 NOTE — Assessment & Plan Note (Signed)
A review of the report of her MRI which showed no evidence of cancer. The cause of her back pain is likely due to degenerative joint disease. I recommend she follows with her rheumatologist.

## 2013-08-01 NOTE — Assessment & Plan Note (Signed)
X-ray shows severe degenerative arthritis. She has appointment to see a rheumatologist for possible corticosteroid injections.

## 2013-08-01 NOTE — Assessment & Plan Note (Signed)
Cause of leukopenia is unknown. Screening for autoimmune disease was negative. She is not symptomatic. Recommend observation only.  

## 2013-08-01 NOTE — Telephone Encounter (Signed)
gv pt appt schedule for oct.  °

## 2013-08-01 NOTE — Assessment & Plan Note (Signed)
CT scan showed no evidence of cancer to explain the cause of her pain. I reassured the patient.

## 2013-08-01 NOTE — Assessment & Plan Note (Signed)
She has chronic anemia for long time. This is likely multifactorial from anemia of chronic disease and mild iron deficiency. She had colonoscopy performed in the past that came back negative. I recommend she takes one oral iron supplements a day, and to increase to twice a day and recheck in 3 months.

## 2013-08-01 NOTE — Assessment & Plan Note (Signed)
I doubt that is the cause of her belly pain. I recommend she contacts her gynecologist for review.

## 2013-08-01 NOTE — Progress Notes (Signed)
[facility] OFFICE PROGRESS NOTE  Sandy Reeve, MD SUMMARY OF HEMATOLOGIC HISTORY: This patient had extensive evaluation 2 years ago for fluctuation of her blood work up with chronic leukopenia and anemia. She also had abnormal CT scan and MRI of the back. She complained of severe arthritis/bone pain in her mid back which correlated with the abnormal changes seen on MRI several years ago. She also have severe pain in both knees and her right tibia. She also has fatigue and lack of energy and chronic cramps. In June 2015, she had MRI of the pack, CT scan of the abdomen, and extensive blood work which excluded diagnosis of cancer. She was found to have mild iron deficiency anemia and was recommended oral ion supplements. INTERVAL HISTORY: Sandy Hodges 68 y.o. female returns for review of her test results. She continued to have back pain, knee pain, and nonspecific fatigue. The patient denies any recent signs or symptoms of bleeding such as spontaneous epistaxis, hematuria or hematochezia.  I have reviewed the past medical history, past surgical history, social history and family history with the patient and they are unchanged from previous note.  ALLERGIES:  has No Known Allergies.  MEDICATIONS:  Current Outpatient Prescriptions  Medication Sig Dispense Refill  . ALPRAZolam (XANAX) 0.25 MG tablet       . aspirin 81 MG tablet Take 81 mg by mouth daily.      Marland Kitchen atenolol (TENORMIN) 50 MG tablet Take 1 tablet by mouth Daily.      . calcium carbonate (OS-CAL) 600 MG TABS Take 600 mg by mouth 2 (two) times daily with a meal. Takes 1 & 1/2 tab in am & 1 tab in eve.      . Cholecalciferol (VITAMIN D-3) 1000 UNITS CAPS Take 1 capsule by mouth daily.      . fenofibrate (TRICOR) 145 MG tablet Take 1 tablet by mouth Daily.      . fish oil-omega-3 fatty acids 1000 MG capsule Take 1 g by mouth 2 (two) times daily.      Marland Kitchen loratadine (CLARITIN) 10 MG tablet Take 10 mg by mouth daily.       . naproxen (NAPROSYN) 500 MG tablet Take 1 tablet by mouth Twice daily.      Marland Kitchen omeprazole (PRILOSEC) 10 MG capsule Take 10 mg by mouth daily.      . simvastatin (ZOCOR) 20 MG tablet Take 1 tablet by mouth Daily.       No current facility-administered medications for this visit.     REVIEW OF SYSTEMS:   Behavioral/Psych: Mood is stable, no new changes  All other systems were reviewed with the patient and are negative.  PHYSICAL EXAMINATION: ECOG PERFORMANCE STATUS: 1 - Symptomatic but completely ambulatory  Filed Vitals:   08/01/13 0931  BP: 158/78  Pulse: 73  Temp: 97.7 F (36.5 C)  Resp: 18   Filed Weights   08/01/13 0931  Weight: 169 lb 6.4 oz (76.839 kg)    GENERAL:alert, no distress and comfortable SKIN: skin color, texture, turgor are normal, no rashes or significant lesions EYES: normal, Conjunctiva are pale and non-injected, sclera clear OROPHARYNX:no exudate, no erythema and lips, buccal mucosa, and tongue normal  NEURO: alert & oriented x 3 with fluent speech, no focal motor/sensory deficits  LABORATORY DATA:  I have reviewed the data as listed No results found for this or any previous visit (from the past 48 hour(s)).  Lab Results  Component Value Date   WBC  3.2* 07/09/2013   HGB 10.2* 07/09/2013   HCT 33.6* 07/09/2013   MCV 90.1 07/09/2013   PLT 138* 07/09/2013    RADIOGRAPHIC STUDIES: I review CT scan and MRI as well as x-ray of the knee with her. I have personally reviewed the radiological images as listed and agreed with the findings in the report. ASSESSMENT & PLAN:  Unspecified deficiency anemia She has chronic anemia for long time. This is likely multifactorial from anemia of chronic disease and mild iron deficiency. She had colonoscopy performed in the past that came back negative. I recommend she takes one oral iron supplements a day, and to increase to twice a day and recheck in 3 months.   Leukopenia Cause of leukopenia is unknown. Screening  for autoimmune disease was negative. She is not symptomatic. Recommend observation only.    Abdominal pain, unspecified site CT scan showed no evidence of cancer to explain the cause of her pain. I reassured the patient.   Uterine mass I doubt that is the cause of her belly pain. I recommend she contacts her gynecologist for review.  Knee pain X-ray shows severe degenerative arthritis. She has appointment to see a rheumatologist for possible corticosteroid injections.  Back pain A review of the report of her MRI which showed no evidence of cancer. The cause of her back pain is likely due to degenerative joint disease. I recommend she follows with her rheumatologist.    All questions were answered. The patient knows to call the clinic with any problems, questions or concerns. No barriers to learning was detected.  I spent 25 minutes counseling the patient face to face. The total time spent in the appointment was 30 minutes and more than 50% was on counseling.     [facility]OR[facility], Tache Bobst, MD 08/01/2013 9:18 PM

## 2013-08-26 ENCOUNTER — Telehealth: Payer: Self-pay | Admitting: *Deleted

## 2013-08-26 NOTE — Telephone Encounter (Signed)
Pt [facility] [address]. Faxed to [phone]

## 2013-11-14 ENCOUNTER — Encounter: Payer: Self-pay | Admitting: Hematology and Oncology

## 2013-11-14 ENCOUNTER — Telehealth: Payer: Self-pay | Admitting: *Deleted

## 2013-11-14 ENCOUNTER — Ambulatory Visit (HOSPITAL_BASED_OUTPATIENT_CLINIC_OR_DEPARTMENT_OTHER): Payer: Medicare Other | Admitting: Hematology and Oncology

## 2013-11-14 ENCOUNTER — Other Ambulatory Visit (HOSPITAL_BASED_OUTPATIENT_CLINIC_OR_DEPARTMENT_OTHER): Payer: Medicare Other

## 2013-11-14 ENCOUNTER — Telehealth: Payer: Self-pay | Admitting: Hematology and Oncology

## 2013-11-14 VITALS — BP 174/77 | HR 63 | Temp 97.4°F | Resp 18 | Ht 65.0 in | Wt 170.9 lb

## 2013-11-14 DIAGNOSIS — Z418 Encounter for other procedures for purposes other than remedying health state: Secondary | ICD-10-CM

## 2013-11-14 DIAGNOSIS — D61818 Other pancytopenia: Secondary | ICD-10-CM

## 2013-11-14 DIAGNOSIS — Z23 Encounter for immunization: Secondary | ICD-10-CM

## 2013-11-14 DIAGNOSIS — D539 Nutritional anemia, unspecified: Secondary | ICD-10-CM

## 2013-11-14 DIAGNOSIS — I10 Essential (primary) hypertension: Secondary | ICD-10-CM

## 2013-11-14 DIAGNOSIS — D72819 Decreased white blood cell count, unspecified: Secondary | ICD-10-CM

## 2013-11-14 DIAGNOSIS — M1990 Unspecified osteoarthritis, unspecified site: Secondary | ICD-10-CM

## 2013-11-14 DIAGNOSIS — Z299 Encounter for prophylactic measures, unspecified: Secondary | ICD-10-CM

## 2013-11-14 HISTORY — DX: Essential (primary) hypertension: I10

## 2013-11-14 HISTORY — DX: Unspecified osteoarthritis, unspecified site: M19.90

## 2013-11-14 LAB — COMPREHENSIVE METABOLIC PANEL (CC13)
ALBUMIN: 3.7 g/dL (ref 3.5–5.0)
ALT: 18 U/L (ref 0–55)
AST: 20 U/L (ref 5–34)
Alkaline Phosphatase: 27 U/L — ABNORMAL LOW (ref 40–150)
Anion Gap: 7 mEq/L (ref 3–11)
BUN: 19.6 mg/dL (ref 7.0–26.0)
CO2: 25 mEq/L (ref 22–29)
Calcium: 9.7 mg/dL (ref 8.4–10.4)
Chloride: 113 mEq/L — ABNORMAL HIGH (ref 98–109)
Creatinine: 0.8 mg/dL (ref 0.6–1.1)
GLUCOSE: 98 mg/dL (ref 70–140)
POTASSIUM: 3.9 meq/L (ref 3.5–5.1)
Sodium: 144 mEq/L (ref 136–145)
TOTAL PROTEIN: 6.2 g/dL — AB (ref 6.4–8.3)
Total Bilirubin: 0.35 mg/dL (ref 0.20–1.20)

## 2013-11-14 LAB — CBC & DIFF AND RETIC
BASO%: 0.7 % (ref 0.0–2.0)
Basophils Absolute: 0 10*3/uL (ref 0.0–0.1)
EOS%: 9.2 % — AB (ref 0.0–7.0)
Eosinophils Absolute: 0.3 10*3/uL (ref 0.0–0.5)
HCT: 36.3 % (ref 34.8–46.6)
HGB: 11.6 g/dL (ref 11.6–15.9)
Immature Retic Fract: 10.3 % — ABNORMAL HIGH (ref 1.60–10.00)
LYMPH%: 41.5 % (ref 14.0–49.7)
MCH: 30.6 pg (ref 25.1–34.0)
MCHC: 32 g/dL (ref 31.5–36.0)
MCV: 95.8 fL (ref 79.5–101.0)
MONO#: 0.3 10*3/uL (ref 0.1–0.9)
MONO%: 9.6 % (ref 0.0–14.0)
NEUT#: 1.1 10*3/uL — ABNORMAL LOW (ref 1.5–6.5)
NEUT%: 39 % (ref 38.4–76.8)
PLATELETS: 126 10*3/uL — AB (ref 145–400)
RBC: 3.79 10*6/uL (ref 3.70–5.45)
RDW: 13.2 % (ref 11.2–14.5)
RETIC %: 2.33 % — AB (ref 0.70–2.10)
RETIC CT ABS: 88.31 10*3/uL (ref 33.70–90.70)
WBC: 2.7 10*3/uL — ABNORMAL LOW (ref 3.9–10.3)
lymph#: 1.1 10*3/uL (ref 0.9–3.3)

## 2013-11-14 LAB — IRON AND TIBC CHCC
%SAT: 30 % (ref 21–57)
Iron: 111 ug/dL (ref 41–142)
TIBC: 368 ug/dL (ref 236–444)
UIBC: 257 ug/dL (ref 120–384)

## 2013-11-14 LAB — SEDIMENTATION RATE: SED RATE: 5 mm/h (ref 0–22)

## 2013-11-14 LAB — FERRITIN CHCC: Ferritin: 27 ng/ml (ref 9–269)

## 2013-11-14 MED ORDER — HYDROCHLOROTHIAZIDE 25 MG PO TABS: 25.0000 mg | ORAL_TABLET | Freq: Every day | ORAL | Status: DC

## 2013-11-14 MED ORDER — PREDNISONE 5 MG PO TABS: 5.0000 mg | ORAL_TABLET | Freq: Every day | ORAL | Status: DC

## 2013-11-14 MED ORDER — INFLUENZA VAC SPLIT QUAD 0.5 ML IM SUSY
0.5000 mL | PREFILLED_SYRINGE | Freq: Once | INTRAMUSCULAR | Status: AC
Start: 2013-11-14 — End: 2013-11-14
  Administered 2013-11-14: 0.5 mL via INTRAMUSCULAR
  Filled 2013-11-14: qty 0.5

## 2013-11-14 NOTE — Assessment & Plan Note (Signed)
We discussed the importance of preventive care and reviewed the vaccination programs. She does not have any prior allergic reactions to influenza vaccination. She agrees to proceed with influenza vaccination today and we will administer it today at the clinic.  

## 2013-11-14 NOTE — Assessment & Plan Note (Signed)
She has trace leg edema. I recommend addition of diuretic in the morning and she continues taking atenolol at nighttime.

## 2013-11-14 NOTE — Assessment & Plan Note (Signed)
I suspect she may have inflammatory arthritis and recommended a trial of prednisone.

## 2013-11-14 NOTE — Telephone Encounter (Signed)
Message copied by Rolanda JayWINDHAM, Jaquin Coy C on Thu Nov 14, 2013 10:07 AM ------      Message from: [facility], NI      Created: Thu Nov 14, 2013  9:58 AM      Regarding: iron       Please tell her to drop iron to once daily until her current bottle runs out      ----- Message -----         From: [facility]         Sent: 11/14/2013   8:42 AM           To: Artis DelayNi Gorsuch, MD                   ------

## 2013-11-14 NOTE — Assessment & Plan Note (Signed)
Cause of leukopenia is unknown. Screening for autoimmune disease was negative. She is not symptomatic. Recommend observation only.

## 2013-11-14 NOTE — Progress Notes (Signed)
[facility] OFFICE PROGRESS NOTE  [name], MD SUMMARY OF HEMATOLOGIC HISTORY: This patient had extensive evaluation 2 years ago for fluctuation of her blood work up with chronic leukopenia and anemia. She also had abnormal CT scan and MRI of the back. She complained of severe arthritis/bone pain in her mid back which correlated with the abnormal changes seen on MRI several years ago. She also have severe pain in both knees and her right tibia. She also has fatigue and lack of energy and chronic cramps. In June 2015, she had MRI of the pack, CT scan of the abdomen, and extensive blood work which excluded diagnosis of cancer. She was found to have mild iron deficiency anemia and was recommended oral ion supplements. INTERVAL HISTORY: Sandy Hodges 68 y.o. female returns for further follow-up. She denies recent infection. She tolerated oral iron supplements well apart from mild constipation. She complains of leg edema. She complained of multiple joint pain.  I have reviewed the past medical history, past surgical history, social history and family history with the patient and they are unchanged from previous note.  ALLERGIES:  has No Known Allergies.  MEDICATIONS:  Current Outpatient Prescriptions  Medication Sig Dispense Refill  . ALPRAZolam (XANAX) 0.25 MG tablet Take 0.25 mg by mouth 3 (three) times daily as needed.       Marland Kitchen aspirin 81 MG tablet Take 81 mg by mouth daily.      Marland Kitchen atenolol (TENORMIN) 50 MG tablet Take 1 tablet by mouth Daily.      . calcium carbonate (OS-CAL) 600 MG TABS Take 600 mg by mouth 2 (two) times daily with a meal. Takes 1 & 1/2 tab in am & 1 tab in eve.      . Cholecalciferol (VITAMIN D-3) 1000 UNITS CAPS Take 1 capsule by mouth daily.      . fenofibrate (TRICOR) 145 MG tablet Take 1 tablet by mouth Daily.      . fish oil-omega-3 fatty acids 1000 MG capsule Take 1 g by mouth 2 (two) times daily.      . fluticasone (FLONASE) 50 MCG/ACT  nasal spray Place into both nostrils daily.      Marland Kitchen loratadine (CLARITIN) 10 MG tablet Take 10 mg by mouth daily.      . naproxen (NAPROSYN) 500 MG tablet Take 1 tablet by mouth Twice daily.      Marland Kitchen omeprazole (PRILOSEC) 10 MG capsule Take 10 mg by mouth daily.      . simvastatin (ZOCOR) 20 MG tablet Take 1 tablet by mouth Daily.      . hydrochlorothiazide (HYDRODIURIL) 25 MG tablet Take 1 tablet (25 mg total) by mouth daily.  30 tablet  5  . predniSONE (DELTASONE) 5 MG tablet Take 1 tablet (5 mg total) by mouth daily with breakfast.  30 tablet  1   No current facility-administered medications for this visit.     REVIEW OF SYSTEMS:   Constitutional: Denies fevers, chills or night sweats Eyes: Denies blurriness of vision Ears, nose, mouth, throat, and face: Denies mucositis or sore throat Respiratory: Denies cough, dyspnea or wheezes Cardiovascular: Denies palpitation, chest discomfort or lower extremity swelling Skin: Denies abnormal skin rashes Lymphatics: Denies new lymphadenopathy or easy bruising Neurological:Denies numbness, tingling or new weaknesses Behavioral/Psych: Mood is stable, no new changes  All other systems were reviewed with the patient and are negative.  PHYSICAL EXAMINATION: ECOG PERFORMANCE STATUS: 1 - Symptomatic but completely ambulatory  Filed Vitals:  11/14/13 0835  BP: 174/77  Pulse: 63  Temp: 97.4 F (36.3 C)  Resp: 18   Filed Weights   11/14/13 0835  Weight: 170 lb 14.4 oz (77.52 kg)    GENERAL:alert, no distress and comfortable SKIN: skin color, texture, turgor are normal, no rashes or significant lesions EYES: normal, Conjunctiva are pink and non-injected, sclera clear OROPHARYNX:no exudate, no erythema and lips, buccal mucosa, and tongue normal  NECK: supple, thyroid normal size, non-tender, without nodularity LYMPH:  no palpable lymphadenopathy in the cervical, axillary or inguinal LUNGS: clear to auscultation and percussion with normal  breathing effort HEART: regular rate & rhythm and no murmurs with trace bilateral lower extremity edema ABDOMEN:abdomen soft, non-tender and normal bowel sounds Musculoskeletal:no cyanosis of digits and no clubbing  NEURO: alert & oriented x 3 with fluent speech, no focal motor/sensory deficits  LABORATORY DATA:  I have reviewed the data as listed Results for orders placed in visit on 11/14/13 (from the past 48 hour(s))  CBC & DIFF AND RETIC     Status: Abnormal   Collection Time    11/14/13  8:21 AM      Result Value Ref Range   WBC 2.7 (*) 3.9 - 10.3 10e3/uL   NEUT# 1.1 (*) 1.5 - 6.5 10e3/uL   HGB 11.6  11.6 - 15.9 g/dL   HCT 69.636.3  29.534.8 - 28.446.6 %   Platelets 126 (*) 145 - 400 10e3/uL   MCV 95.8  79.5 - 101.0 fL   MCH 30.6  25.1 - 34.0 pg   MCHC 32.0  31.5 - 36.0 g/dL   RBC 1.323.79  4.403.70 - 1.025.45 10e6/uL   RDW 13.2  11.2 - 14.5 %   lymph# 1.1  0.9 - 3.3 10e3/uL   MONO# 0.3  0.1 - 0.9 10e3/uL   Eosinophils Absolute 0.3  0.0 - 0.5 10e3/uL   Basophils Absolute 0.0  0.0 - 0.1 10e3/uL   NEUT% 39.0  38.4 - 76.8 %   LYMPH% 41.5  14.0 - 49.7 %   MONO% 9.6  0.0 - 14.0 %   EOS% 9.2 (*) 0.0 - 7.0 %   BASO% 0.7  0.0 - 2.0 %   Retic % 2.33 (*) 0.70 - 2.10 %   Retic Ct Abs 88.31  33.70 - 90.70 10e3/uL   Immature Retic Fract 10.30 (*) 1.60 - 10.00 %  FERRITIN [facility]     Status: None   Collection Time    11/14/13  8:21 AM      Result Value Ref Range   Ferritin 27  9 - 269 ng/ml  IRON AND TIBC [facility]     Status: None   Collection Time    11/14/13  8:21 AM      Result Value Ref Range   Iron 111  41 - 142 ug/dL   TIBC 725368  366236 - 440444 ug/dL   UIBC 347257  425120 - 956384 ug/dL   %SAT 30  21 - 57 %  COMPREHENSIVE METABOLIC PANEL ([facility]13)     Status: Abnormal   Collection Time    11/14/13  8:22 AM      Result Value Ref Range   Sodium 144  136 - 145 mEq/L   Potassium 3.9  3.5 - 5.1 mEq/L   Chloride 113 (*) 98 - 109 mEq/L   CO2 25  22 - 29 mEq/L   Glucose 98  70 - 140 mg/dl   BUN 38.719.6  7.0 - 56.426.0 mg/dL  Creatinine 0.8  0.6 - 1.1 mg/dL   Total Bilirubin 1.610.35  0.20 - 1.20 mg/dL   Alkaline Phosphatase 27 (*) 40 - 150 U/L   AST 20  5 - 34 U/L   ALT 18  0 - 55 U/L   Total Protein 6.2 (*) 6.4 - 8.3 g/dL   Albumin 3.7  3.5 - 5.0 g/dL   Calcium 9.7  8.4 - 09.610.4 mg/dL   Anion Gap 7  3 - 11 mEq/L    Lab Results  Component Value Date   WBC 2.7* 11/14/2013   HGB 11.6 11/14/2013   HCT 36.3 11/14/2013   MCV 95.8 11/14/2013   PLT 126* 11/14/2013   ASSESSMENT & PLAN:  Leukopenia Cause of leukopenia is unknown. Screening for autoimmune disease was negative. She is not symptomatic. Recommend observation only.   Deficiency anemia She has chronic anemia for long time. This is likely multifactorial from anemia of chronic disease and mild iron deficiency. She had colonoscopy performed in the past that came back negative. She is doing well on iron replacement therapy with resolution of anemia. I recommend she reduce her iron supplement to one a day until her prescription runs out and then we'll recheck iron studies in 6 months.    Arthritis I suspect she may have inflammatory arthritis and recommended a trial of prednisone.  Essential hypertension She has trace leg edema. I recommend addition of diuretic in the morning and she continues taking atenolol at nighttime.  Preventive measure We discussed the importance of preventive care and reviewed the vaccination programs. She does not have any prior allergic reactions to influenza vaccination. She agrees to proceed with influenza vaccination today and we will administer it today at the clinic.     All questions were answered. The patient knows to call the clinic with any problems, questions or concerns. No barriers to learning was detected.  I spent 25 minutes counseling the patient face to face. The total time spent in the appointment was 30 minutes and more than 50% was on counseling.     [facility], Dashawn Golda, MD 11/14/2013 2:05 PM

## 2013-11-14 NOTE — Telephone Encounter (Signed)
LVM for pt to return nurse's call for lab results.

## 2013-11-14 NOTE — Telephone Encounter (Signed)
gv and printed appt sched and avs for pt for April 2016 °

## 2013-11-14 NOTE — Assessment & Plan Note (Signed)
She has chronic anemia for long time. This is likely multifactorial from anemia of chronic disease and mild iron deficiency. She had colonoscopy performed in the past that came back negative. She is doing well on iron replacement therapy with resolution of anemia. I recommend she reduce her iron supplement to one a day until her prescription runs out and then we'll recheck iron studies in 6 months.

## 2013-11-15 ENCOUNTER — Telehealth: Payer: Self-pay | Admitting: *Deleted

## 2013-11-15 NOTE — Telephone Encounter (Signed)
Not recommended together due to increased risk of gastritis

## 2013-11-15 NOTE — Telephone Encounter (Signed)
Informed pt of iron levels ok.  Decrease iron to once daily and then stop after current bottle runs out. She verbalized understanding. She also wants to know if ok to continue taking Naproxen with new Rx for Prednisone?

## 2013-11-15 NOTE — Telephone Encounter (Signed)
Left VM for pt instructed not to take the naproxen while on prednisone.  Asked her to call back if any questions.

## 2014-05-01 ENCOUNTER — Telehealth: Payer: Self-pay | Admitting: *Deleted

## 2014-05-01 ENCOUNTER — Other Ambulatory Visit: Payer: Self-pay | Admitting: Hematology and Oncology

## 2014-05-01 DIAGNOSIS — D539 Nutritional anemia, unspecified: Secondary | ICD-10-CM

## 2014-05-01 NOTE — Telephone Encounter (Signed)
Per staff message and POF I have scheduled appts. Advised scheduler of appts. JMW  

## 2014-05-15 ENCOUNTER — Telehealth: Payer: Self-pay | Admitting: Hematology and Oncology

## 2014-05-15 ENCOUNTER — Encounter: Payer: Self-pay | Admitting: Hematology and Oncology

## 2014-05-15 ENCOUNTER — Ambulatory Visit (HOSPITAL_BASED_OUTPATIENT_CLINIC_OR_DEPARTMENT_OTHER): Payer: Medicare Other

## 2014-05-15 ENCOUNTER — Ambulatory Visit (HOSPITAL_BASED_OUTPATIENT_CLINIC_OR_DEPARTMENT_OTHER): Payer: Medicare Other | Admitting: Hematology and Oncology

## 2014-05-15 VITALS — BP 133/65 | HR 66 | Temp 97.9°F | Resp 18 | Ht 65.0 in | Wt 156.6 lb

## 2014-05-15 DIAGNOSIS — D539 Nutritional anemia, unspecified: Secondary | ICD-10-CM

## 2014-05-15 DIAGNOSIS — D72818 Other decreased white blood cell count: Secondary | ICD-10-CM | POA: Diagnosis not present

## 2014-05-15 DIAGNOSIS — D72819 Decreased white blood cell count, unspecified: Secondary | ICD-10-CM

## 2014-05-15 DIAGNOSIS — D509 Iron deficiency anemia, unspecified: Secondary | ICD-10-CM

## 2014-05-15 DIAGNOSIS — D61818 Other pancytopenia: Secondary | ICD-10-CM

## 2014-05-15 LAB — CBC WITH DIFFERENTIAL/PLATELET
BASO%: 1 % (ref 0.0–2.0)
Basophils Absolute: 0 10*3/uL (ref 0.0–0.1)
EOS ABS: 0.3 10*3/uL (ref 0.0–0.5)
EOS%: 7.3 % — ABNORMAL HIGH (ref 0.0–7.0)
HCT: 33.1 % — ABNORMAL LOW (ref 34.8–46.6)
HEMOGLOBIN: 10.8 g/dL — AB (ref 11.6–15.9)
LYMPH%: 31.3 % (ref 14.0–49.7)
MCH: 31.3 pg (ref 25.1–34.0)
MCHC: 32.6 g/dL (ref 31.5–36.0)
MCV: 95.9 fL (ref 79.5–101.0)
MONO#: 0.4 10*3/uL (ref 0.1–0.9)
MONO%: 10.3 % (ref 0.0–14.0)
NEUT#: 1.8 10*3/uL (ref 1.5–6.5)
NEUT%: 50.1 % (ref 38.4–76.8)
PLATELETS: 217 10*3/uL (ref 145–400)
RBC: 3.45 10*6/uL — ABNORMAL LOW (ref 3.70–5.45)
RDW: 12.4 % (ref 11.2–14.5)
WBC: 3.6 10*3/uL — AB (ref 3.9–10.3)
lymph#: 1.1 10*3/uL (ref 0.9–3.3)

## 2014-05-15 LAB — IRON AND TIBC CHCC
%SAT: 23 % (ref 21–57)
Iron: 81 ug/dL (ref 41–142)
TIBC: 355 ug/dL (ref 236–444)
UIBC: 274 ug/dL (ref 120–384)

## 2014-05-15 LAB — SEDIMENTATION RATE: Sed Rate: 28 mm/hr (ref 0–30)

## 2014-05-15 LAB — HOLD TUBE, BLOOD BANK

## 2014-05-15 LAB — MORPHOLOGY: PLT EST: ADEQUATE

## 2014-05-15 LAB — FERRITIN CHCC: FERRITIN: 104 ng/mL (ref 9–269)

## 2014-05-15 NOTE — Telephone Encounter (Signed)
Pt confirmed labs/ov per 04/27 POF, gave AVS and Calendar..... KJ, blood transfusion cancelled by MD..

## 2014-05-15 NOTE — Progress Notes (Signed)
[facility] OFFICE PROGRESS NOTE  Sandy Reeve, MD SUMMARY OF HEMATOLOGIC HISTORY:  This patient had extensive evaluation 2 years ago for fluctuation of her blood work up with chronic leukopenia and anemia. She also had abnormal CT scan and MRI of the back. She complained of severe arthritis/bone pain in her mid back which correlated with the abnormal changes seen on MRI several years ago. She also have severe pain in both knees and her right tibia. She also has fatigue and lack of energy and chronic cramps. In June 2015, she had MRI of the pack, CT scan of the abdomen, and extensive blood work which excluded diagnosis of cancer. She was found to have mild iron deficiency anemia and was recommended oral ion supplements. INTERVAL HISTORY: Sandy Hodges 69 y.o. female returns for further follow-up. She have recent bone fracture requiring surgery and placement of a cast. She denies recent infection. She had mild fatigue. The patient denies any recent signs or symptoms of bleeding such as spontaneous epistaxis, hematuria or hematochezia.   I have reviewed the past medical history, past surgical history, social history and family history with the patient and they are unchanged from previous note.  ALLERGIES:  has No Known Allergies.  MEDICATIONS:  Current Outpatient Prescriptions  Medication Sig Dispense Refill  . ALPRAZolam (XANAX) 0.25 MG tablet Take 0.25 mg by mouth 3 (three) times daily as needed.     Marland Kitchen aspirin 81 MG tablet Take 81 mg by mouth daily.    Marland Kitchen atenolol (TENORMIN) 50 MG tablet Take 1 tablet by mouth Daily.    . calcium carbonate (OS-CAL) 600 MG TABS Take 600 mg by mouth 2 (two) times daily with a meal. Takes 1 & 1/2 tab in am & 1 tab in eve.    . Cholecalciferol (VITAMIN D-3) 1000 UNITS CAPS Take 1 capsule by mouth daily.    . fenofibrate (TRICOR) 145 MG tablet Take 1 tablet by mouth Daily.    . fish oil-omega-3 fatty acids 1000 MG capsule Take 1 g by  mouth 2 (two) times daily.    . fluticasone (FLONASE) 50 MCG/ACT nasal spray Place into both nostrils daily.    Marland Kitchen HYDROmorphone (DILAUDID) 2 MG tablet   0  . lisinopril-hydrochlorothiazide (PRINZIDE,ZESTORETIC) 20-12.5 MG per tablet   0  . loratadine (CLARITIN) 10 MG tablet Take 10 mg by mouth daily.    Marland Kitchen omeprazole (PRILOSEC) 10 MG capsule Take 10 mg by mouth daily.    . ondansetron (ZOFRAN) 4 MG tablet   0  . simvastatin (ZOCOR) 20 MG tablet Take 1 tablet by mouth Daily.    . naproxen (NAPROSYN) 500 MG tablet Take 1 tablet by mouth Twice daily.     No current facility-administered medications for this visit.     REVIEW OF SYSTEMS:   Constitutional: Denies fevers, chills or night sweats Eyes: Denies blurriness of vision Ears, nose, mouth, throat, and face: Denies mucositis or sore throat Respiratory: Denies cough, dyspnea or wheezes Cardiovascular: Denies palpitation, chest discomfort or lower extremity swelling Gastrointestinal:  Denies nausea, heartburn or change in bowel habits Skin: Denies abnormal skin rashes Lymphatics: Denies new lymphadenopathy or easy bruising Neurological:Denies numbness, tingling or new weaknesses Behavioral/Psych: Mood is stable, no new changes  All other systems were reviewed with the patient and are negative.  PHYSICAL EXAMINATION: ECOG PERFORMANCE STATUS: 1 - Symptomatic but completely ambulatory  Filed Vitals:   05/15/14 0853  BP: 133/65  Pulse: 66  Temp: 97.9 F (36.6  C)  Resp: 18   Filed Weights   05/15/14 0853  Weight: 156 lb 9.6 oz (71.033 kg)    GENERAL:alert, no distress and comfortable SKIN: skin color, texture, turgor are normal, no rashes or significant lesions EYES: normal, Conjunctiva are pink and non-injected, sclera clear OROPHARYNX:no exudate, no erythema and lips, buccal mucosa, and tongue normal  Musculoskeletal:no cyanosis of digits and no clubbing . She has cast in situ NEURO: alert & oriented x 3 with fluent speech,  no focal motor/sensory deficits  LABORATORY DATA:  I have reviewed the data as listed Results for orders placed or performed in visit on 05/15/14 (from the past 48 hour(s))  CBC with Differential/Platelet     Status: Abnormal   Collection Time: 05/15/14  8:37 AM  Result Value Ref Range   WBC 3.6 (L) 3.9 - 10.3 10e3/uL   NEUT# 1.8 1.5 - 6.5 10e3/uL   HGB 10.8 (L) 11.6 - 15.9 g/dL   HCT 14.733.1 (L) 82.934.8 - 56.246.6 %   Platelets 217 145 - 400 10e3/uL   MCV 95.9 79.5 - 101.0 fL   MCH 31.3 25.1 - 34.0 pg   MCHC 32.6 31.5 - 36.0 g/dL   RBC 1.303.45 (L) 8.653.70 - 7.845.45 10e6/uL   RDW 12.4 11.2 - 14.5 %   lymph# 1.1 0.9 - 3.3 10e3/uL   MONO# 0.4 0.1 - 0.9 10e3/uL   Eosinophils Absolute 0.3 0.0 - 0.5 10e3/uL   Basophils Absolute 0.0 0.0 - 0.1 10e3/uL   NEUT% 50.1 38.4 - 76.8 %   LYMPH% 31.3 14.0 - 49.7 %   MONO% 10.3 0.0 - 14.0 %   EOS% 7.3 (H) 0.0 - 7.0 %   BASO% 1.0 0.0 - 2.0 %  Morphology     Status: None   Collection Time: 05/15/14  8:37 AM  Result Value Ref Range   Polychromasia Slight Slight   White Cell Comments 2% myelocytes    PLT EST Adequate Adequate  Ferritin     Status: None   Collection Time: 05/15/14  8:37 AM  Result Value Ref Range   Ferritin 104 9 - 269 ng/ml  Iron and TIBC     Status: None   Collection Time: 05/15/14  8:37 AM  Result Value Ref Range   Iron 81 41 - 142 ug/dL   TIBC 696355 295236 - 284444 ug/dL   UIBC 132274 440120 - 102384 ug/dL   %SAT 23 21 - 57 %  Hold Tube, Blood Bank     Status: None   Collection Time: 05/15/14  8:37 AM  Result Value Ref Range   Hold Tube, Blood Bank Blood Bank Order Cancelled     Lab Results  Component Value Date   WBC 3.6* 05/15/2014   HGB 10.8* 05/15/2014   HCT 33.1* 05/15/2014   MCV 95.9 05/15/2014   PLT 217 05/15/2014    ASSESSMENT & PLAN:  Leukopenia Cause of leukopenia is unknown. Screening for autoimmune disease was negative. She is not symptomatic. Recommend observation only.      Deficiency anemia She has chronic anemia for  long time. This is likely multifactorial from anemia of chronic disease and mild iron deficiency. She had colonoscopy performed in the past that came back negative. With recent bone fracture and surgery, she may have mild surgical blood loss. She is not symptomatic and I recommend close observation.     All questions were answered. The patient knows to call the clinic with any problems, questions or concerns. No barriers to  learning was detected.  I spent 15 minutes counseling the patient face to face. The total time spent in the appointment was 20 minutes and more than 50% was on counseling.     [facility], April Colter, MD 4/28/20162:41 PM

## 2014-05-15 NOTE — Assessment & Plan Note (Signed)
Cause of leukopenia is unknown. Screening for autoimmune disease was negative. She is not symptomatic. Recommend observation only.  

## 2014-05-15 NOTE — Assessment & Plan Note (Signed)
She has chronic anemia for long time. This is likely multifactorial from anemia of chronic disease and mild iron deficiency. She had colonoscopy performed in the past that came back negative. With recent bone fracture and surgery, she may have mild surgical blood loss. She is not symptomatic and I recommend close observation.

## 2014-09-18 HISTORY — PX: ROTATOR CUFF REPAIR: SHX139

## 2014-10-16 ENCOUNTER — Encounter: Payer: Self-pay | Admitting: Hematology and Oncology

## 2014-10-16 ENCOUNTER — Other Ambulatory Visit: Payer: Self-pay | Admitting: Hematology and Oncology

## 2014-10-16 DIAGNOSIS — F32A Depression, unspecified: Secondary | ICD-10-CM

## 2014-10-16 DIAGNOSIS — F329 Major depressive disorder, single episode, unspecified: Secondary | ICD-10-CM

## 2014-10-16 HISTORY — DX: Depression, unspecified: F32.A

## 2014-10-16 MED ORDER — MIRTAZAPINE 15 MG PO TABS: 15.0000 mg | ORAL_TABLET | Freq: Every day | ORAL | Status: DC

## 2014-11-06 ENCOUNTER — Other Ambulatory Visit: Payer: Medicare Other

## 2014-11-06 ENCOUNTER — Telehealth: Payer: Self-pay | Admitting: Hematology and Oncology

## 2014-11-06 ENCOUNTER — Ambulatory Visit: Payer: Medicare Other | Admitting: Hematology and Oncology

## 2014-11-06 NOTE — Telephone Encounter (Signed)
S.W. PT HUSBAND AND ADVISED ON NEW APPT....OK AND AWARE THAT THEY WILL HAVE AN APPT TOGETHER

## 2014-11-28 ENCOUNTER — Other Ambulatory Visit (HOSPITAL_BASED_OUTPATIENT_CLINIC_OR_DEPARTMENT_OTHER): Payer: Medicare Other

## 2014-11-28 ENCOUNTER — Other Ambulatory Visit: Payer: Self-pay | Admitting: Hematology and Oncology

## 2014-11-28 ENCOUNTER — Encounter: Payer: Self-pay | Admitting: Hematology and Oncology

## 2014-11-28 ENCOUNTER — Ambulatory Visit (HOSPITAL_BASED_OUTPATIENT_CLINIC_OR_DEPARTMENT_OTHER): Payer: Medicare Other | Admitting: Hematology and Oncology

## 2014-11-28 ENCOUNTER — Telehealth: Payer: Self-pay | Admitting: Hematology and Oncology

## 2014-11-28 VITALS — BP 138/70 | HR 62 | Temp 98.1°F | Resp 18 | Ht 65.0 in | Wt 148.2 lb

## 2014-11-28 DIAGNOSIS — F32A Depression, unspecified: Secondary | ICD-10-CM

## 2014-11-28 DIAGNOSIS — D72819 Decreased white blood cell count, unspecified: Secondary | ICD-10-CM

## 2014-11-28 DIAGNOSIS — D649 Anemia, unspecified: Secondary | ICD-10-CM | POA: Diagnosis not present

## 2014-11-28 DIAGNOSIS — F329 Major depressive disorder, single episode, unspecified: Secondary | ICD-10-CM | POA: Diagnosis not present

## 2014-11-28 DIAGNOSIS — D61818 Other pancytopenia: Secondary | ICD-10-CM

## 2014-11-28 LAB — CBC & DIFF AND RETIC
BASO%: 0.8 % (ref 0.0–2.0)
Basophils Absolute: 0 10*3/uL (ref 0.0–0.1)
EOS%: 5.1 % (ref 0.0–7.0)
Eosinophils Absolute: 0.2 10*3/uL (ref 0.0–0.5)
HCT: 36.1 % (ref 34.8–46.6)
HGB: 11.5 g/dL — ABNORMAL LOW (ref 11.6–15.9)
Immature Retic Fract: 3.3 % (ref 1.60–10.00)
LYMPH#: 1.1 10*3/uL (ref 0.9–3.3)
LYMPH%: 27.3 % (ref 14.0–49.7)
MCH: 30.8 pg (ref 25.1–34.0)
MCHC: 31.9 g/dL (ref 31.5–36.0)
MCV: 96.8 fL (ref 79.5–101.0)
MONO#: 0.3 10*3/uL (ref 0.1–0.9)
MONO%: 8.1 % (ref 0.0–14.0)
NEUT%: 58.7 % (ref 38.4–76.8)
NEUTROS ABS: 2.3 10*3/uL (ref 1.5–6.5)
Platelets: 124 10*3/uL — ABNORMAL LOW (ref 145–400)
RBC: 3.73 10*6/uL (ref 3.70–5.45)
RDW: 12.3 % (ref 11.2–14.5)
RETIC CT ABS: 51.47 10*3/uL (ref 33.70–90.70)
Retic %: 1.38 % (ref 0.70–2.10)
WBC: 4 10*3/uL (ref 3.9–10.3)

## 2014-11-28 NOTE — Assessment & Plan Note (Signed)
She has chronic pancytopenia for long time. This is likely multifactorial from anemia of chronic disease and mild iron deficiency. Her white blood cell counts and platelet counts fluctuate up and down She had extensive evaluation in the past with no definitive cause found. She is not symptomatic and I recommend close observation.

## 2014-11-28 NOTE — Telephone Encounter (Signed)
Gave pt appt for 11/27/15 @ 10.30am.

## 2014-11-28 NOTE — Progress Notes (Signed)
[facility] OFFICE PROGRESS NOTE  Sandy ReeveWOLTERS,SHARON A, MD SUMMARY OF HEMATOLOGIC HISTORY:  This patient had extensive evaluation 2 years ago for fluctuation of her blood work up with chronic leukopenia and anemia. She also had abnormal CT scan and MRI of the back. She complained of severe arthritis/bone pain in her mid back which correlated with the abnormal changes seen on MRI several years ago. She also have severe pain in both knees and her right tibia. She also has fatigue and lack of energy and chronic cramps. In June 2015, she had MRI of the pack, CT scan of the abdomen, and extensive blood work which excluded diagnosis of cancer. She was found to have mild iron deficiency anemia and was recommended oral ion supplements. INTERVAL HISTORY: Sandy Hodges 69 y.o. female returns forfurther follow-up. She continues to have some mild depression and difficulties with coping. She was started with Zoloft recently and felt better. Denies recent infection. The patient denies any recent signs or symptoms of bleeding such as spontaneous epistaxis, hematuria or hematochezia.  I have reviewed the past medical history, past surgical history, social history and family history with the patient and they are unchanged from previous note.  ALLERGIES:  has No Known Allergies.  MEDICATIONS:  Current Outpatient Prescriptions  Medication Sig Dispense Refill  . ALPRAZolam (XANAX) 0.25 MG tablet Take 0.25 mg by mouth 3 (three) times daily as needed.     Marland Kitchen. aspirin 81 MG tablet Take 81 mg by mouth daily.    Marland Kitchen. atenolol (TENORMIN) 50 MG tablet Take 1 tablet by mouth Daily.    . calcium carbonate (OS-CAL) 600 MG TABS Take 600 mg by mouth 2 (two) times daily with Hodges meal. Takes 1 & 1/2 tab in am & 1 tab in eve.    . Cholecalciferol (VITAMIN D-3) 1000 UNITS CAPS Take 1 capsule by mouth daily.    . fenofibrate (TRICOR) 145 MG tablet Take 1 tablet by mouth Daily.    . fish oil-omega-3 fatty acids 1000  MG capsule Take 1 g by mouth 2 (two) times daily.    . fluticasone (FLONASE) 50 MCG/ACT nasal spray Place into both nostrils daily.    Marland Kitchen. HYDROmorphone (DILAUDID) 2 MG tablet   0  . lisinopril-hydrochlorothiazide (PRINZIDE,ZESTORETIC) 20-12.5 MG per tablet   0  . loratadine (CLARITIN) 10 MG tablet Take 10 mg by mouth daily.    . naproxen (NAPROSYN) 500 MG tablet Take 1 tablet by mouth Twice daily.    Marland Kitchen. omeprazole (PRILOSEC) 10 MG capsule Take 10 mg by mouth daily.    . ondansetron (ZOFRAN) 4 MG tablet   0  . sertraline (ZOLOFT) 50 MG tablet Take 50 mg by mouth daily.  0  . simvastatin (ZOCOR) 20 MG tablet Take 1 tablet by mouth Daily.     No current facility-administered medications for this visit.     REVIEW OF SYSTEMS:   Constitutional: Denies fevers, chills or night sweats Eyes: Denies blurriness of vision Ears, nose, mouth, throat, and face: Denies mucositis or sore throat Respiratory: Denies cough, dyspnea or wheezes Cardiovascular: Denies palpitation, chest discomfort or lower extremity swelling Gastrointestinal:  Denies nausea, heartburn or change in bowel habits Skin: Denies abnormal skin rashes Lymphatics: Denies new lymphadenopathy or easy bruising Neurological:Denies numbness, tingling or new weaknesses Behavioral/Psych: Mood is stable, no new changes  All other systems were reviewed with the patient and are negative.  PHYSICAL EXAMINATION: ECOG PERFORMANCE STATUS: 1 - Symptomatic but completely ambulatory  Filed  Vitals:   11/28/14 1047  BP: 138/70  Pulse: 62  Temp: 98.1 F (36.7 C)  Resp: 18   Filed Weights   11/28/14 1047  Weight: 148 lb 3.2 oz (67.223 kg)    GENERAL:alert, no distress and comfortable SKIN: skin color, texture, turgor are normal, no rashes or significant lesions EYES: normal, Conjunctiva are pink and non-injected, sclera clear Musculoskeletal:no cyanosis of digits and no clubbing  NEURO: alert & oriented x 3 with fluent speech, no focal  motor/sensory deficits  LABORATORY DATA:  I have reviewed the data as listed Results for orders placed or performed in visit on 11/28/14 (from the past 48 hour(s))  CBC & Diff and Retic     Status: Abnormal   Collection Time: 11/28/14 10:29 AM  Result Value Ref Range   WBC 4.0 3.9 - 10.3 10e3/uL   NEUT# 2.3 1.5 - 6.5 10e3/uL   HGB 11.5 (L) 11.6 - 15.9 g/dL   HCT 96.0 45.4 - 09.8 %   Platelets 124 (L) 145 - 400 10e3/uL   MCV 96.8 79.5 - 101.0 fL   MCH 30.8 25.1 - 34.0 pg   MCHC 31.9 31.5 - 36.0 g/dL   RBC 1.19 1.47 - 8.29 10e6/uL   RDW 12.3 11.2 - 14.5 %   lymph# 1.1 0.9 - 3.3 10e3/uL   MONO# 0.3 0.1 - 0.9 10e3/uL   Eosinophils Absolute 0.2 0.0 - 0.5 10e3/uL   Basophils Absolute 0.0 0.0 - 0.1 10e3/uL   NEUT% 58.7 38.4 - 76.8 %   LYMPH% 27.3 14.0 - 49.7 %   MONO% 8.1 0.0 - 14.0 %   EOS% 5.1 0.0 - 7.0 %   BASO% 0.8 0.0 - 2.0 %   Retic % 1.38 0.70 - 2.10 %   Retic Ct Abs 51.47 33.70 - 90.70 10e3/uL   Immature Retic Fract 3.30 1.60 - 10.00 %    Lab Results  Component Value Date   WBC 4.0 11/28/2014   HGB 11.5* 11/28/2014   HCT 36.1 11/28/2014   MCV 96.8 11/28/2014   PLT 124* 11/28/2014    ASSESSMENT & PLAN:  Pancytopenia (HCC) She has chronic pancytopenia for long time. This is likely multifactorial from anemia of chronic disease and mild iron deficiency. Her white blood cell counts and platelet counts fluctuate up and down She had extensive evaluation in the past with no definitive cause found. She is not symptomatic and I recommend close observation.     Depression She has difficulties coping with her husband illness and had been depressed without suicidal ideation. I prescribed mirtazapine for her in the past but she cannot tolerate the side effects. She was recently prescribed Zoloft by her primary care doctor and she tolerated that better with improvement of her mood and symptoms. I recommend she continue same.   All questions were answered. The patient knows  to call the clinic with any problems, questions or concerns. No barriers to learning was detected.  I spent 15 minutes counseling the patient face to face. The total time spent in the appointment was 20 minutes and more than 50% was on counseling.     Bertis Ruddy, Navi Ewton, MD 11/11/20164:27 PM

## 2014-11-28 NOTE — Assessment & Plan Note (Signed)
She has difficulties coping with her husband illness and had been depressed without suicidal ideation. I prescribed mirtazapine for her in the past but she cannot tolerate the side effects. She was recently prescribed Zoloft by her primary care doctor and she tolerated that better with improvement of her mood and symptoms. I recommend she continue same.

## 2014-11-29 LAB — SEDIMENTATION RATE: Sed Rate: 5 mm/hr (ref 0–30)

## 2015-01-21 ENCOUNTER — Telehealth: Payer: Self-pay

## 2015-01-21 NOTE — Telephone Encounter (Signed)
Counseling Intern Note:   Received counseling referral for Pt from support center staff.  Spoke with patient over the phone and she stated that she has already found counseling services elsewhere.   No support center follow up indicated at this time.   Zada GirtLisa Kahron Kauth Counseling Intern [phone]

## 2015-09-28 ENCOUNTER — Ambulatory Visit: Payer: Self-pay | Admitting: Physician Assistant

## 2015-09-28 NOTE — H&P (Signed)
TOTAL KNEE ADMISSION H&P  Patient is being admitted for right total knee arthroplasty.  Subjective:  Chief Complaint:right knee pain.  HPI: Sandy Hodges, 70 y.o. female, has a history of pain and functional disability in the right knee due to arthritis and has failed non-surgical conservative treatments for greater than 12 weeks to includeNSAID's and/or analgesics, corticosteriod injections, viscosupplementation injections and activity modification.  Onset of symptoms was gradual, starting >10 years ago with gradually worsening course since that time. The patient noted no past surgery on the right knee(s).  Patient currently rates pain in the right knee(s) at 10 out of 10 with activity. Patient has night pain, worsening of pain with activity and weight bearing, pain that interferes with activities of daily living, pain with passive range of motion, crepitus and joint swelling.  Patient has evidence of periarticular osteophytes and joint space narrowing by imaging studies.There is no active infection.  Patient Active Problem List   Diagnosis Date Noted  . Depression 10/16/2014  . Arthritis 11/14/2013  . Essential hypertension 11/14/2013  . Preventive measure 11/14/2013  . Uterine mass 08/01/2013  . Deficiency anemia 07/09/2013  . Fatigue 07/09/2013  . Knee pain 07/09/2013  . Back pain 07/09/2013  . Abdominal pain, unspecified site 07/09/2013  . Leukopenia 07/09/2013  . Flank pain 07/09/2013  . Pancytopenia (HCC) 12/14/2011   Past Medical History:  Diagnosis Date  . Abdominal pain, unspecified site 07/09/2013  . Arthritis 11/14/2013  . Back pain 07/09/2013  . Depression 10/16/2014  . Essential hypertension 11/14/2013  . Fatigue 07/09/2013  . Fibromyalgia   . Hyperlipidemia   . Hypertension   . Knee pain 07/09/2013  . OA (osteoarthritis) of knee   . Pancytopenia (HCC) 12/14/2011  . Unspecified deficiency anemia 07/09/2013    Past Surgical History:  Procedure Laterality Date  .  COLONOSCOPY       (Not in a hospital admission) No Known Allergies  Social History  Substance Use Topics  . Smoking status: Never Smoker  . Smokeless tobacco: Never Used  . Alcohol use No    Family History  Problem Relation Age of Onset  . Cancer Mother     breast ca  . Hypertension Father   . CVA Father      Review of Systems  Constitutional: Positive for weight loss.  Musculoskeletal: Positive for joint pain.  All other systems reviewed and are negative.   Objective:  Physical Exam  Constitutional: She is oriented to person, place, and time. She appears well-developed and well-nourished. No distress.  HENT:  Head: Normocephalic and atraumatic.  Nose: Nose normal.  Eyes: Conjunctivae and EOM are normal. Pupils are equal, round, and reactive to light.  Neck: Normal range of motion. Neck supple.  Cardiovascular: Normal rate, regular rhythm, normal heart sounds and intact distal pulses.   Respiratory: Effort normal and breath sounds normal. No respiratory distress. She has no wheezes. She has no rales. She exhibits no tenderness.  GI: Soft. Bowel sounds are normal. She exhibits no distension. There is no tenderness.  Musculoskeletal:       Right knee: She exhibits swelling. She exhibits normal range of motion, no LCL laxity and no MCL laxity. Tenderness found. Medial joint line tenderness noted.  Lymphadenopathy:    She has no cervical adenopathy.  Neurological: She is alert and oriented to person, place, and time. No cranial nerve deficit.  Skin: Skin is warm and dry. No rash noted. No erythema.  Psychiatric: She has a normal mood and   affect. Her behavior is normal.    Vital signs in last 24 hours: @VSRANGES@  Labs:   Estimated body mass index is 24.66 kg/m as calculated from the following:   Height as of 11/28/14: 5' 5" (1.651 m).   Weight as of 11/28/14: 67.2 kg (148 lb 3.2 oz).   Imaging Review Plain radiographs demonstrate severe degenerative joint  disease of the right knee(s). The overall alignment ismild varus. The bone quality appears to be good for age and reported activity level.  Assessment/Plan:  End stage arthritis, right knee   The patient history, physical examination, clinical judgment of the provider and imaging studies are consistent with end stage degenerative joint disease of the right knee(s) and total knee arthroplasty is deemed medically necessary. The treatment options including medical management, injection therapy arthroscopy and arthroplasty were discussed at length. The risks and benefits of total knee arthroplasty were presented and reviewed. The risks due to aseptic loosening, infection, stiffness, patella tracking problems, thromboembolic complications and other imponderables were discussed. The patient acknowledged the explanation, agreed to proceed with the plan and consent was signed. Patient is being admitted for inpatient treatment for surgery, pain control, PT, OT, prophylactic antibiotics, VTE prophylaxis, progressive ambulation and ADL's and discharge planning. The patient is planning to be discharged home with home health services  

## 2015-10-02 ENCOUNTER — Encounter (HOSPITAL_COMMUNITY): Payer: Self-pay

## 2015-10-02 NOTE — Pre-Procedure Instructions (Signed)
Sandy Hodges  10/02/2015      [facility] - [place], [place] - [address] [place] [id number] Phone: [phone] Fax: [phone]    Your procedure is scheduled on Friday September 29.  [facility] 6:45 [facility].M.  Call this number if you have problems the morning of surgery:  [phone]   Remember:  Do not eat food or drink liquids after midnight.  Take these medicines the morning of surgery with A SIP OF WATER:  Xanax, venlafazine (Effexor)  7 days prior to surgery STOP taking any Aspirin, diclofenac (Voltaren), Aleve, Naproxen, Ibuprofen, Motrin, Advil, Goody's, BC's, all herbal medications, fish oil, and all vitamins    Do not wear jewelry, make-up or nail polish.  Do not wear lotions, powders, or perfumes, or deoderant.  Do not shave 48 hours prior to surgery.  Men may shave face and neck.  Do not bring valuables to the hospital.  [facility] is not responsible for any belongings or valuables.  Contacts, dentures or bridgework may not be worn into surgery.  Leave your suitcase in the car.  After surgery it may be brought to your room.  For patients admitted to the hospital, discharge time will be determined by your treatment team.  Patients discharged the day of surgery will not be allowed to drive home.    Special instructions:    [facility]- Preparing For Surgery  Before surgery, you can play an important role. Because skin is not sterile, your skin needs to be as free of germs as possible. You can reduce the number of germs on your skin by washing with CHG (chlorahexidine gluconate) Soap before surgery.  CHG is an antiseptic cleaner which kills germs and bonds with the skin to continue killing germs even after washing.  Please do not use if you have an allergy to CHG or antibacterial soaps. If your skin becomes reddened/irritated stop using the CHG.  Do not shave  (including legs and underarms) for at least 48 hours prior to first CHG shower. It is OK to shave your face.  Please follow these instructions carefully.   1. Shower the NIGHT BEFORE SURGERY and the MORNING OF SURGERY with CHG.   2. If you chose to wash your hair, wash your hair first as usual with your normal shampoo.  3. After you shampoo, rinse your hair and body thoroughly to remove the shampoo.  4. Use CHG as you would any other liquid soap. You can apply CHG directly to the skin and wash gently with a scrungie or a clean washcloth.   5. Apply the CHG Soap to your body ONLY FROM THE NECK DOWN.  Do not use on open wounds or open sores. Avoid contact with your eyes, ears, mouth and genitals (private parts). Wash genitals (private parts) with your normal soap.  6. Wash thoroughly, paying special attention to the area where your surgery will be performed.  7. Thoroughly rinse your body with warm water from the neck down.  8. DO NOT shower/wash with your normal soap after using and rinsing off the CHG Soap.  9. Pat yourself dry with a CLEAN TOWEL.   10. Wear CLEAN PAJAMAS   11. Place CLEAN SHEETS on your bed the night of your first shower and DO NOT SLEEP WITH PETS.    Day of Surgery: Do not apply any deodorants/lotions. Please wear clean clothes to the hospital/surgery [facility].  Please read over the following fact sheets that you were given. MRSA Information

## 2015-10-05 ENCOUNTER — Ambulatory Visit (HOSPITAL_COMMUNITY)
Admission: RE | Admit: 2015-10-05 | Discharge: 2015-10-05 | Disposition: A | Discharge status: Home / Self Care | Payer: Medicare Other | Source: Ambulatory Visit | Attending: Physician Assistant | Admitting: Physician Assistant

## 2015-10-05 ENCOUNTER — Encounter (HOSPITAL_COMMUNITY)
Admission: RE | Admit: 2015-10-05 | Discharge: 2015-10-05 | Disposition: A | Discharge status: Home / Self Care | Payer: Medicare Other | Source: Ambulatory Visit | Attending: Orthopedic Surgery | Admitting: Orthopedic Surgery

## 2015-10-05 ENCOUNTER — Other Ambulatory Visit: Payer: Self-pay

## 2015-10-05 ENCOUNTER — Encounter (HOSPITAL_COMMUNITY): Payer: Self-pay

## 2015-10-05 DIAGNOSIS — M1711 Unilateral primary osteoarthritis, right knee: Secondary | ICD-10-CM

## 2015-10-05 DIAGNOSIS — I517 Cardiomegaly: Secondary | ICD-10-CM | POA: Insufficient documentation

## 2015-10-05 DIAGNOSIS — Z01812 Encounter for preprocedural laboratory examination: Secondary | ICD-10-CM | POA: Insufficient documentation

## 2015-10-05 DIAGNOSIS — Z01810 Encounter for preprocedural cardiovascular examination: Secondary | ICD-10-CM | POA: Diagnosis not present

## 2015-10-05 DIAGNOSIS — J90 Pleural effusion, not elsewhere classified: Secondary | ICD-10-CM | POA: Insufficient documentation

## 2015-10-05 HISTORY — DX: Other specified postprocedural states: Z98.890

## 2015-10-05 HISTORY — DX: Other specified postprocedural states: R11.2

## 2015-10-05 LAB — COMPREHENSIVE METABOLIC PANEL
ALT: 16 U/L (ref 14–54)
ANION GAP: 7 (ref 5–15)
AST: 22 U/L (ref 15–41)
Albumin: 3.8 g/dL (ref 3.5–5.0)
Alkaline Phosphatase: 28 U/L — ABNORMAL LOW (ref 38–126)
BUN: 26 mg/dL — ABNORMAL HIGH (ref 6–20)
CALCIUM: 10 mg/dL (ref 8.9–10.3)
CHLORIDE: 108 mmol/L (ref 101–111)
CO2: 26 mmol/L (ref 22–32)
Creatinine, Ser: 1.17 mg/dL — ABNORMAL HIGH (ref 0.44–1.00)
GFR, EST AFRICAN AMERICAN: 53 mL/min — AB (ref >60)
GFR, EST NON AFRICAN AMERICAN: 46 mL/min — AB (ref >60)
Glucose, Bld: 106 mg/dL — ABNORMAL HIGH (ref 65–99)
Potassium: 4.1 mmol/L (ref 3.5–5.1)
SODIUM: 141 mmol/L (ref 135–145)
Total Bilirubin: 0.5 mg/dL (ref 0.3–1.2)
Total Protein: 6.4 g/dL — ABNORMAL LOW (ref 6.5–8.1)

## 2015-10-05 LAB — URINALYSIS, ROUTINE W REFLEX MICROSCOPIC
Bilirubin Urine: NEGATIVE
GLUCOSE, UA: NEGATIVE mg/dL
HGB URINE DIPSTICK: NEGATIVE
Ketones, ur: NEGATIVE mg/dL
LEUKOCYTES UA: NEGATIVE
Nitrite: NEGATIVE
PH: 6.5 (ref 5.0–8.0)
PROTEIN: NEGATIVE mg/dL
Specific Gravity, Urine: 1.008 (ref 1.005–1.030)

## 2015-10-05 LAB — CBC WITH DIFFERENTIAL/PLATELET
Basophils Absolute: 0.1 10*3/uL (ref 0.0–0.1)
Basophils Relative: 1 %
EOS ABS: 0.5 10*3/uL (ref 0.0–0.7)
EOS PCT: 11 %
HCT: 36.1 % (ref 36.0–46.0)
Hemoglobin: 11.1 g/dL — ABNORMAL LOW (ref 12.0–15.0)
LYMPHS ABS: 1.5 10*3/uL (ref 0.7–4.0)
Lymphocytes Relative: 30 %
MCH: 29.9 pg (ref 26.0–34.0)
MCHC: 30.7 g/dL (ref 30.0–36.0)
MCV: 97.3 fL (ref 78.0–100.0)
MONO ABS: 0.3 10*3/uL (ref 0.1–1.0)
MONOS PCT: 6 %
Neutro Abs: 2.5 10*3/uL (ref 1.7–7.7)
Neutrophils Relative %: 52 %
PLATELETS: 186 10*3/uL (ref 150–400)
RBC: 3.71 MIL/uL — ABNORMAL LOW (ref 3.87–5.11)
RDW: 13.2 % (ref 11.5–15.5)
WBC: 4.9 10*3/uL (ref 4.0–10.5)

## 2015-10-05 LAB — TYPE AND SCREEN
ABO/RH(D): A POS
Antibody Screen: NEGATIVE

## 2015-10-05 LAB — PROTIME-INR
INR: 1.14
PROTHROMBIN TIME: 14.7 s (ref 11.4–15.2)

## 2015-10-05 LAB — SURGICAL PCR SCREEN
MRSA, PCR: NEGATIVE
Staphylococcus aureus: NEGATIVE

## 2015-10-05 LAB — ABO/RH: ABO/RH(D): A POS

## 2015-10-05 LAB — APTT: aPTT: 28 seconds (ref 24–36)

## 2015-10-05 NOTE — Progress Notes (Addendum)
PCP:Dr. Johnn HaiSharon Hodges @ [facility]  Hematologist: Dr.Gi Gorsuch  Pt. Stated she had a stress test 10 yrs, ago for irregular heart beat. Thought she saw Dr. Eugene GaviaVaranise and he didn't find anything and he dismissed her.

## 2015-10-06 LAB — URINE CULTURE

## 2015-10-07 NOTE — Progress Notes (Addendum)
Anesthesia Chart Review: Patient is a 70 year old female scheduled for right TKA on 10/16/15 by Dr. Madelon Lipsaffrey.  History includes nonsmoker, pancytopenia, anemia, hyperlipidemia, hypertension, fibromyalgia, depression, arthritis.  PCP is Dr. Mila PalmerSharon Wolters. Hematologist is Dr. Bertis RuddyGorsuch, last visit 11/28/14. Continued observation of her chronic pancytopenia and anemia recommended.. She is not currently followed by a cardiologist, but saw Dr. Eldridge DaceVaranasi in 2011 for palpitations. Last office visit with Dr. Eldridge DaceVaranasi was on 06/12/12 for bradycardia follow-up. Diltiazem held, but otherwise when necessary cardiology follow-up recommended.  Meds include Xanax, aspirin 81 mg, atenolol, TriCor, 65 FE, fish oil omega 3 fatty acids, lisinopril-HCTZ, Claritin, Prilosec, Zocor, Effexor XR.  BP (!) 148/70   Pulse 61   Temp 36.4 C   Resp 20   Ht 5\' 5"  (1.651 m)   Wt 147 lb 6.4 oz (66.9 kg)   SpO2 100%   BMI 24.53 kg/m   10/05/2015 EKG: Sinus rhythm with first-degree AV block, right bundle branch block, minimal voltage criteria for LVH, may be normal variant. 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  08/24/09-08/25/09 Holter monitor: Predominantly normal sinus rhythm. Symptoms corresponded to PACs and rare junctional escape beats.  04/17/09 Echo: Conclusion: 1. Left ventricular ejection fraction 60-65%. 2. Trace aortic valve regurgitation. 3. Aortic valve is sclerotic but opens well.  10/05/15 CXR: FINDINGS: Mediastinum and hilar structures normal. Lungs are clear. Cardiomegaly with normal pulmonary vascularity. Small left pleural effusion. No pneumothorax. Degenerative changes thoracic spine. Pectus deformity noted . IMPRESSION: 1. Small left pleural effusion. 2. Cardiomegaly, no pulmonary venous congestion.  Preoperative labs noted. Cr 1.17. H/H 11.1/36.1, PLT 186K. PT/PTT WNL. Urine culture showed less than 10,000 colonies, insignificant  growth. T&S done.  Discussed with anesthesiologist Dr. Glade Stanford. Fitzgerald. Follow-up if patient has had recent PCP evaluation. If not, confirm that patient is without CV complaint and has reasonable activity tolerance prior to proceeding. PCP records pending. Chart will be left for follow-up.  Velna Ochsllison Junice Fei, PA-C [facility]/Anesthesiology Phone [phone] 10/07/2015 11:40 AM  Addendum: I followed up with Tresa EndoKelly at Dr. Candise Bowensaffrey's office. Patient was seen by Dr. Paulino RilyWolters on 10/06/15. They are awaiting clearance recommendations and will fax once received. (Update: Dr. Paulino RilyWolters cleared patient from a medical and cardiac standpoint following recent evaluation 10/06/15 and review of EKG. If no acute changes then I would anticipate that she could proceed as planned. PCP office note/clearance on patient's hard chart for review as needed.)  Velna Ochsllison Margerie Fraiser, PA-C [facility]Anesthesiology Phone [phone] 10/09/2015 1:23 PM

## 2015-10-16 ENCOUNTER — Inpatient Hospital Stay (HOSPITAL_COMMUNITY)
Admission: RE | Admit: 2015-10-16 | Discharge: 2015-10-17 | DRG: 470 | Disposition: A | Discharge status: Home Health | Payer: Medicare Other | Source: Ambulatory Visit | Attending: Orthopedic Surgery | Admitting: Orthopedic Surgery

## 2015-10-16 ENCOUNTER — Inpatient Hospital Stay (HOSPITAL_COMMUNITY): Payer: Medicare Other | Admitting: Vascular Surgery

## 2015-10-16 ENCOUNTER — Inpatient Hospital Stay (HOSPITAL_COMMUNITY): Payer: Medicare Other | Admitting: Anesthesiology

## 2015-10-16 ENCOUNTER — Encounter (HOSPITAL_COMMUNITY): Payer: Self-pay | Admitting: *Deleted

## 2015-10-16 ENCOUNTER — Encounter (HOSPITAL_COMMUNITY)
Admission: RE | Disposition: A | Discharge status: Home Health | Payer: Self-pay | Source: Ambulatory Visit | Attending: Orthopedic Surgery

## 2015-10-16 DIAGNOSIS — M24561 Contracture, right knee: Secondary | ICD-10-CM | POA: Diagnosis present

## 2015-10-16 DIAGNOSIS — M25561 Pain in right knee: Secondary | ICD-10-CM | POA: Diagnosis present

## 2015-10-16 DIAGNOSIS — F329 Major depressive disorder, single episode, unspecified: Secondary | ICD-10-CM | POA: Diagnosis present

## 2015-10-16 DIAGNOSIS — I10 Essential (primary) hypertension: Secondary | ICD-10-CM | POA: Diagnosis present

## 2015-10-16 DIAGNOSIS — M1711 Unilateral primary osteoarthritis, right knee: Secondary | ICD-10-CM | POA: Diagnosis present

## 2015-10-16 DIAGNOSIS — M21161 Varus deformity, not elsewhere classified, right knee: Secondary | ICD-10-CM | POA: Diagnosis present

## 2015-10-16 DIAGNOSIS — M797 Fibromyalgia: Secondary | ICD-10-CM | POA: Diagnosis present

## 2015-10-16 DIAGNOSIS — E785 Hyperlipidemia, unspecified: Secondary | ICD-10-CM | POA: Diagnosis present

## 2015-10-16 HISTORY — PX: TOTAL KNEE ARTHROPLASTY: SHX125

## 2015-10-16 SURGERY — ARTHROPLASTY, KNEE, TOTAL
Anesthesia: General | Laterality: Right

## 2015-10-16 MED ORDER — LIDOCAINE HCL (CARDIAC) 20 MG/ML IV SOLN
INTRAVENOUS | Status: DC | PRN
  Administered 2015-10-16: 40 mg via INTRAVENOUS

## 2015-10-16 MED ORDER — ATENOLOL 50 MG PO TABS
50.0000 mg | ORAL_TABLET | Freq: Every evening | ORAL | Status: DC
  Administered 2015-10-16: 50 mg via ORAL
  Filled 2015-10-16: qty 1

## 2015-10-16 MED ORDER — FENTANYL CITRATE (PF) 100 MCG/2ML IJ SOLN
50.0000 ug | Freq: Once | INTRAMUSCULAR | Status: AC
  Administered 2015-10-16: 50 ug via INTRAVENOUS

## 2015-10-16 MED ORDER — SODIUM CHLORIDE 0.9% FLUSH
INTRAVENOUS | Status: DC | PRN
  Administered 2015-10-16: 50 mL via INTRAVENOUS

## 2015-10-16 MED ORDER — ONDANSETRON HCL 4 MG PO TABS: 4.0000 mg | ORAL_TABLET | Freq: Three times a day (TID) | ORAL | 0 refills | Status: DC | PRN

## 2015-10-16 MED ORDER — HYDROCODONE-ACETAMINOPHEN 5-325 MG PO TABS: 1.0000 | ORAL_TABLET | ORAL | 0 refills | Status: DC | PRN

## 2015-10-16 MED ORDER — HYDROCHLOROTHIAZIDE 12.5 MG PO CAPS
12.5000 mg | ORAL_CAPSULE | Freq: Every day | ORAL | Status: DC
  Administered 2015-10-16: 12.5 mg via ORAL
  Filled 2015-10-16 (×2): qty 1

## 2015-10-16 MED ORDER — CHLORHEXIDINE GLUCONATE 4 % EX LIQD: 60.0000 mL | Freq: Once | CUTANEOUS | Status: DC

## 2015-10-16 MED ORDER — BUPIVACAINE LIPOSOME 1.3 % IJ SUSP: INTRAMUSCULAR | Status: DC | PRN

## 2015-10-16 MED ORDER — FENTANYL CITRATE (PF) 100 MCG/2ML IJ SOLN
INTRAMUSCULAR | Status: AC
  Filled 2015-10-16: qty 2

## 2015-10-16 MED ORDER — LISINOPRIL 20 MG PO TABS
20.0000 mg | ORAL_TABLET | Freq: Every day | ORAL | Status: DC
  Administered 2015-10-16: 20 mg via ORAL
  Filled 2015-10-16 (×2): qty 1

## 2015-10-16 MED ORDER — CALCIUM CARBONATE 1250 (500 CA) MG PO TABS
1.0000 | ORAL_TABLET | Freq: Two times a day (BID) | ORAL | Status: DC
  Administered 2015-10-16: 500 mg via ORAL
  Filled 2015-10-16 (×2): qty 1

## 2015-10-16 MED ORDER — ACETAMINOPHEN 650 MG RE SUPP: 650.0000 mg | Freq: Four times a day (QID) | RECTAL | Status: DC | PRN

## 2015-10-16 MED ORDER — SENNOSIDES-DOCUSATE SODIUM 8.6-50 MG PO TABS: 1.0000 | ORAL_TABLET | Freq: Every evening | ORAL | Status: DC | PRN

## 2015-10-16 MED ORDER — SORBITOL 70 % SOLN: 30.0000 mL | Freq: Every day | Status: DC | PRN

## 2015-10-16 MED ORDER — OXYCODONE HCL 5 MG/5ML PO SOLN: 5.0000 mg | Freq: Once | ORAL | Status: DC | PRN

## 2015-10-16 MED ORDER — FENTANYL CITRATE (PF) 100 MCG/2ML IJ SOLN
25.0000 ug | INTRAMUSCULAR | Status: DC | PRN
  Administered 2015-10-16: 50 ug via INTRAVENOUS

## 2015-10-16 MED ORDER — ACETAMINOPHEN 325 MG PO TABS
650.0000 mg | ORAL_TABLET | Freq: Four times a day (QID) | ORAL | Status: DC | PRN
Start: 2015-10-16 — End: 2015-10-17

## 2015-10-16 MED ORDER — VITAMIN D 1000 UNITS PO TABS
1000.0000 [IU] | ORAL_TABLET | Freq: Every evening | ORAL | Status: DC
  Administered 2015-10-16: 1000 [IU] via ORAL
  Filled 2015-10-16: qty 1

## 2015-10-16 MED ORDER — MIDAZOLAM HCL 2 MG/2ML IJ SOLN
1.0000 mg | Freq: Once | INTRAMUSCULAR | Status: AC
  Administered 2015-10-16: 1 mg via INTRAVENOUS

## 2015-10-16 MED ORDER — BUPIVACAINE-EPINEPHRINE (PF) 0.25% -1:200000 IJ SOLN
INTRAMUSCULAR | Status: AC
  Filled 2015-10-16: qty 30

## 2015-10-16 MED ORDER — SIMVASTATIN 20 MG PO TABS
20.0000 mg | ORAL_TABLET | Freq: Every evening | ORAL | Status: DC
  Administered 2015-10-16: 20 mg via ORAL
  Filled 2015-10-16: qty 1

## 2015-10-16 MED ORDER — FENOFIBRATE 160 MG PO TABS
160.0000 mg | ORAL_TABLET | Freq: Every day | ORAL | Status: DC
  Administered 2015-10-16: 160 mg via ORAL
  Filled 2015-10-16: qty 1

## 2015-10-16 MED ORDER — HYDROCODONE-ACETAMINOPHEN 7.5-325 MG PO TABS
1.0000 | ORAL_TABLET | ORAL | Status: DC | PRN
  Administered 2015-10-16 – 2015-10-17 (×4): 2 via ORAL
  Filled 2015-10-16 (×4): qty 2

## 2015-10-16 MED ORDER — ONDANSETRON HCL 4 MG/2ML IJ SOLN
INTRAMUSCULAR | Status: DC | PRN
  Administered 2015-10-16: 4 mg via INTRAVENOUS

## 2015-10-16 MED ORDER — OXYCODONE HCL 5 MG PO TABS: 5.0000 mg | ORAL_TABLET | Freq: Once | ORAL | Status: DC | PRN

## 2015-10-16 MED ORDER — VENLAFAXINE HCL ER 75 MG PO CP24
75.0000 mg | ORAL_CAPSULE | Freq: Every day | ORAL | Status: DC
  Filled 2015-10-16: qty 1

## 2015-10-16 MED ORDER — CEFAZOLIN IN D5W 1 GM/50ML IV SOLN
1.0000 g | Freq: Four times a day (QID) | INTRAVENOUS | Status: AC
  Administered 2015-10-16 (×2): 1 g via INTRAVENOUS
  Filled 2015-10-16 (×2): qty 50

## 2015-10-16 MED ORDER — PROPOFOL 10 MG/ML IV BOLUS
INTRAVENOUS | Status: AC
  Filled 2015-10-16: qty 20

## 2015-10-16 MED ORDER — APIXABAN 2.5 MG PO TABS: 2.5000 mg | ORAL_TABLET | Freq: Two times a day (BID) | ORAL | 0 refills | Status: DC

## 2015-10-16 MED ORDER — SODIUM CHLORIDE 0.9 % IJ SOLN
INTRAMUSCULAR | Status: AC
  Filled 2015-10-16: qty 20

## 2015-10-16 MED ORDER — ONDANSETRON HCL 4 MG/2ML IJ SOLN: 4.0000 mg | Freq: Once | INTRAMUSCULAR | Status: DC | PRN

## 2015-10-16 MED ORDER — TRANEXAMIC ACID 1000 MG/10ML IV SOLN
1000.0000 mg | Freq: Once | INTRAVENOUS | Status: AC
  Administered 2015-10-16: 1000 mg via INTRAVENOUS
  Filled 2015-10-16: qty 10

## 2015-10-16 MED ORDER — DOCUSATE SODIUM 100 MG PO CAPS
100.0000 mg | ORAL_CAPSULE | Freq: Two times a day (BID) | ORAL | Status: DC
  Administered 2015-10-16 (×2): 100 mg via ORAL
  Filled 2015-10-16 (×3): qty 1

## 2015-10-16 MED ORDER — TRANEXAMIC ACID 1000 MG/10ML IV SOLN
2000.0000 mg | INTRAVENOUS | Status: DC
  Filled 2015-10-16: qty 20

## 2015-10-16 MED ORDER — MIDAZOLAM HCL 2 MG/2ML IJ SOLN
INTRAMUSCULAR | Status: AC
  Filled 2015-10-16: qty 2

## 2015-10-16 MED ORDER — PHENOL 1.4 % MT LIQD: 1.0000 | OROMUCOSAL | Status: DC | PRN

## 2015-10-16 MED ORDER — PANTOPRAZOLE SODIUM 40 MG PO TBEC
40.0000 mg | DELAYED_RELEASE_TABLET | Freq: Every day | ORAL | Status: DC
  Filled 2015-10-16: qty 1

## 2015-10-16 MED ORDER — FERROUS SULFATE 325 (65 FE) MG PO TABS
325.0000 mg | ORAL_TABLET | Freq: Every day | ORAL | Status: DC
  Filled 2015-10-16: qty 1

## 2015-10-16 MED ORDER — ARTIFICIAL TEARS OP OINT
TOPICAL_OINTMENT | OPHTHALMIC | Status: DC | PRN
  Administered 2015-10-16: 1 via OPHTHALMIC

## 2015-10-16 MED ORDER — LISINOPRIL-HYDROCHLOROTHIAZIDE 20-12.5 MG PO TABS: 1.0000 | ORAL_TABLET | Freq: Every evening | ORAL | Status: DC

## 2015-10-16 MED ORDER — MENTHOL 3 MG MT LOZG: 1.0000 | LOZENGE | OROMUCOSAL | Status: DC | PRN

## 2015-10-16 MED ORDER — CALCIUM CARBONATE 600 MG PO TABS: 600.0000 mg | ORAL_TABLET | Freq: Two times a day (BID) | ORAL | Status: DC

## 2015-10-16 MED ORDER — LACTATED RINGERS IV SOLN
INTRAVENOUS | Status: DC
  Administered 2015-10-16 (×2): via INTRAVENOUS

## 2015-10-16 MED ORDER — SODIUM CHLORIDE 0.9 % IV SOLN
INTRAVENOUS | Status: DC
  Administered 2015-10-16: 15:00:00 via INTRAVENOUS

## 2015-10-16 MED ORDER — MIDAZOLAM HCL 5 MG/5ML IJ SOLN
INTRAMUSCULAR | Status: DC | PRN
  Administered 2015-10-16 (×2): 1 mg via INTRAVENOUS

## 2015-10-16 MED ORDER — BUPIVACAINE LIPOSOME 1.3 % IJ SUSP
20.0000 mL | INTRAMUSCULAR | Status: AC
  Administered 2015-10-16: 20 mL
  Filled 2015-10-16: qty 20

## 2015-10-16 MED ORDER — HYDROMORPHONE HCL 1 MG/ML IJ SOLN: 1.0000 mg | INTRAMUSCULAR | Status: DC | PRN

## 2015-10-16 MED ORDER — ONDANSETRON HCL 4 MG/2ML IJ SOLN
4.0000 mg | Freq: Four times a day (QID) | INTRAMUSCULAR | Status: DC | PRN
  Administered 2015-10-16: 4 mg via INTRAVENOUS
  Filled 2015-10-16: qty 2

## 2015-10-16 MED ORDER — BUPIVACAINE LIPOSOME 1.3 % IJ SUSP: 20.0000 mL | INTRAMUSCULAR | Status: DC

## 2015-10-16 MED ORDER — CEFAZOLIN SODIUM-DEXTROSE 2-4 GM/100ML-% IV SOLN
2.0000 g | INTRAVENOUS | Status: AC
  Administered 2015-10-16: 2 g via INTRAVENOUS
  Filled 2015-10-16: qty 100

## 2015-10-16 MED ORDER — SODIUM CHLORIDE 0.9 % IR SOLN
Status: DC | PRN
  Administered 2015-10-16: 3000 mL

## 2015-10-16 MED ORDER — ALPRAZOLAM 0.5 MG PO TABS
0.5000 mg | ORAL_TABLET | Freq: Three times a day (TID) | ORAL | Status: DC
Start: 2015-10-16 — End: 2015-10-17
  Administered 2015-10-16 (×2): 1 mg via ORAL
  Administered 2015-10-17: 0.5 mg via ORAL
  Filled 2015-10-16: qty 2
  Filled 2015-10-16: qty 1
  Filled 2015-10-16: qty 2

## 2015-10-16 MED ORDER — PROPOFOL 10 MG/ML IV BOLUS
INTRAVENOUS | Status: DC | PRN
  Administered 2015-10-16: 130 mg via INTRAVENOUS

## 2015-10-16 MED ORDER — METOCLOPRAMIDE HCL 5 MG PO TABS: 5.0000 mg | ORAL_TABLET | Freq: Three times a day (TID) | ORAL | Status: DC | PRN

## 2015-10-16 MED ORDER — SODIUM CHLORIDE 0.9 % IV SOLN: INTRAVENOUS | Status: DC

## 2015-10-16 MED ORDER — 0.9 % SODIUM CHLORIDE (POUR BTL) OPTIME
TOPICAL | Status: DC | PRN
  Administered 2015-10-16: 1000 mL

## 2015-10-16 MED ORDER — FLEET ENEMA 7-19 GM/118ML RE ENEM: 1.0000 | ENEMA | Freq: Once | RECTAL | Status: DC | PRN

## 2015-10-16 MED ORDER — FENTANYL CITRATE (PF) 100 MCG/2ML IJ SOLN
INTRAMUSCULAR | Status: DC | PRN
  Administered 2015-10-16 (×6): 50 ug via INTRAVENOUS

## 2015-10-16 MED ORDER — ONDANSETRON HCL 4 MG PO TABS: 4.0000 mg | ORAL_TABLET | Freq: Four times a day (QID) | ORAL | Status: DC | PRN

## 2015-10-16 MED ORDER — SODIUM CHLORIDE 0.9 % IV SOLN
1000.0000 mg | INTRAVENOUS | Status: AC
  Administered 2015-10-16: 1000 mg via INTRAVENOUS
  Filled 2015-10-16 (×2): qty 10

## 2015-10-16 MED ORDER — METOCLOPRAMIDE HCL 5 MG/ML IJ SOLN: 5.0000 mg | Freq: Three times a day (TID) | INTRAMUSCULAR | Status: DC | PRN

## 2015-10-16 MED ORDER — APIXABAN 2.5 MG PO TABS
2.5000 mg | ORAL_TABLET | Freq: Two times a day (BID) | ORAL | Status: DC
  Administered 2015-10-17: 2.5 mg via ORAL
  Filled 2015-10-16: qty 1

## 2015-10-16 MED ORDER — BUPIVACAINE-EPINEPHRINE 0.25% -1:200000 IJ SOLN
INTRAMUSCULAR | Status: DC | PRN
  Administered 2015-10-16: 20 mL

## 2015-10-16 MED ORDER — DIPHENHYDRAMINE HCL 12.5 MG/5ML PO ELIX: 12.5000 mg | ORAL_SOLUTION | ORAL | Status: DC | PRN

## 2015-10-16 MED ORDER — ADULT MULTIVITAMIN W/MINERALS CH
1.0000 | ORAL_TABLET | Freq: Every day | ORAL | Status: DC
  Filled 2015-10-16: qty 1

## 2015-10-16 SURGICAL SUPPLY — 68 items
BANDAGE ACE 4X5 VEL STRL LF (GAUZE/BANDAGES/DRESSINGS) ×3 IMPLANT
BANDAGE ACE 6X5 VEL STRL LF (GAUZE/BANDAGES/DRESSINGS) ×3 IMPLANT
BANDAGE ESMARK 6X9 LF (GAUZE/BANDAGES/DRESSINGS) ×1 IMPLANT
BLADE SAGITTAL 25.0X1.19X90 (BLADE) ×2 IMPLANT
BLADE SAGITTAL 25.0X1.19X90MM (BLADE) ×1
BLADE SAW SAG 90X13X1.27 (BLADE) ×3 IMPLANT
BNDG CMPR 9X6 STRL LF SNTH (GAUZE/BANDAGES/DRESSINGS) ×1
BNDG ESMARK 6X9 LF (GAUZE/BANDAGES/DRESSINGS) ×3
BOWL SMART MIX CTS (DISPOSABLE) ×3 IMPLANT
CAP KNEE TOTAL 3 SIGMA ×3 IMPLANT
CEMENT HV SMART SET (Cement) ×6 IMPLANT
COVER SURGICAL LIGHT HANDLE (MISCELLANEOUS) ×3 IMPLANT
CUFF TOURNIQUET SINGLE 34IN LL (TOURNIQUET CUFF) ×3 IMPLANT
CUFF TOURNIQUET SINGLE 44IN (TOURNIQUET CUFF) IMPLANT
DRAPE INCISE IOBAN 66X45 STRL (DRAPES) IMPLANT
DRAPE ORTHO SPLIT 77X108 STRL (DRAPES) ×6
DRAPE SURG ORHT 6 SPLT 77X108 (DRAPES) ×2 IMPLANT
DRAPE U-SHAPE 47X51 STRL (DRAPES) ×3 IMPLANT
DRSG ADAPTIC 3X8 NADH LF (GAUZE/BANDAGES/DRESSINGS) ×3 IMPLANT
DRSG PAD ABDOMINAL 8X10 ST (GAUZE/BANDAGES/DRESSINGS) ×8 IMPLANT
DURAPREP 26ML APPLICATOR (WOUND CARE) ×3 IMPLANT
ELECT REM PT RETURN 9FT ADLT (ELECTROSURGICAL) ×3
ELECTRODE REM PT RTRN 9FT ADLT (ELECTROSURGICAL) ×1 IMPLANT
EVACUATOR 1/8 PVC DRAIN (DRAIN) IMPLANT
FACESHIELD WRAPAROUND (MASK) ×6 IMPLANT
FACESHIELD WRAPAROUND OR TEAM (MASK) ×2 IMPLANT
FLOSEAL 10ML (HEMOSTASIS) IMPLANT
GAUZE SPONGE 4X4 12PLY STRL (GAUZE/BANDAGES/DRESSINGS) ×3 IMPLANT
GLOVE BIOGEL PI IND STRL 8 (GLOVE) ×4 IMPLANT
GLOVE BIOGEL PI INDICATOR 8 (GLOVE) ×8
GLOVE ORTHO TXT STRL SZ7.5 (GLOVE) ×3 IMPLANT
GLOVE SURG ORTHO 8.0 STRL STRW (GLOVE) ×3 IMPLANT
GOWN STRL REUS W/ TWL LRG LVL3 (GOWN DISPOSABLE) ×2 IMPLANT
GOWN STRL REUS W/ TWL XL LVL3 (GOWN DISPOSABLE) ×1 IMPLANT
GOWN STRL REUS W/TWL 2XL LVL3 (GOWN DISPOSABLE) ×3 IMPLANT
GOWN STRL REUS W/TWL LRG LVL3 (GOWN DISPOSABLE) ×9
GOWN STRL REUS W/TWL XL LVL3 (GOWN DISPOSABLE) ×3
HANDPIECE INTERPULSE COAX TIP (DISPOSABLE) ×3
HOOD PEEL AWAY FACE SHEILD DIS (HOOD) ×3 IMPLANT
IMMOBILIZER KNEE 22 UNIV (SOFTGOODS) ×2 IMPLANT
KIT BASIN OR (CUSTOM PROCEDURE TRAY) ×3 IMPLANT
KIT ROOM TURNOVER OR (KITS) ×3 IMPLANT
MANIFOLD NEPTUNE II (INSTRUMENTS) ×3 IMPLANT
NEEDLE 22X1 1/2 (OR ONLY) (NEEDLE) ×6 IMPLANT
NS IRRIG 1000ML POUR BTL (IV SOLUTION) ×3 IMPLANT
PACK TOTAL JOINT (CUSTOM PROCEDURE TRAY) ×3 IMPLANT
PAD ARMBOARD 7.5X6 YLW CONV (MISCELLANEOUS) ×6 IMPLANT
PAD CAST 4YDX4 CTTN HI CHSV (CAST SUPPLIES) ×1 IMPLANT
PADDING CAST ABS 4INX4YD NS (CAST SUPPLIES) ×2
PADDING CAST ABS 6INX4YD NS (CAST SUPPLIES) ×2
PADDING CAST ABS COTTON 4X4 ST (CAST SUPPLIES) ×1 IMPLANT
PADDING CAST ABS COTTON 6X4 NS (CAST SUPPLIES) ×1 IMPLANT
PADDING CAST COTTON 4X4 STRL (CAST SUPPLIES) ×3
PADDING CAST COTTON 6X4 STRL (CAST SUPPLIES) ×3 IMPLANT
SET HNDPC FAN SPRY TIP SCT (DISPOSABLE) ×1 IMPLANT
STAPLER VISISTAT 35W (STAPLE) ×3 IMPLANT
SUCTION FRAZIER HANDLE 10FR (MISCELLANEOUS) ×2
SUCTION TUBE FRAZIER 10FR DISP (MISCELLANEOUS) ×1 IMPLANT
SUT ETHIBOND NAB CT1 #1 30IN (SUTURE) ×9 IMPLANT
SUT VIC AB 0 CT1 27 (SUTURE) ×3
SUT VIC AB 0 CT1 27XBRD ANBCTR (SUTURE) ×1 IMPLANT
SUT VIC AB 2-0 CT1 27 (SUTURE) ×6
SUT VIC AB 2-0 CT1 TAPERPNT 27 (SUTURE) ×2 IMPLANT
SYR CONTROL 10ML LL (SYRINGE) ×3 IMPLANT
TOWEL OR 17X24 6PK STRL BLUE (TOWEL DISPOSABLE) ×3 IMPLANT
TOWEL OR 17X26 10 PK STRL BLUE (TOWEL DISPOSABLE) ×3 IMPLANT
TRAY FOLEY CATH 16FRSI W/METER (SET/KITS/TRAYS/PACK) IMPLANT
WATER STERILE IRR 1000ML POUR (IV SOLUTION) ×1 IMPLANT

## 2015-10-16 NOTE — Brief Op Note (Signed)
10/16/2015  11:24 AM  PATIENT:  Sandy Hodges  70 y.o. female  PRE-OPERATIVE DIAGNOSIS:  OA RIGHT KNEE  POST-OPERATIVE DIAGNOSIS:  OA RIGHT KNEE  PROCEDURE:  Procedure(s): TOTAL KNEE ARTHROPLASTY (Right)  SURGEON:  Surgeon(s) and Role:    * Frederico Hammananiel Caffrey, MD - Primary  PHYSICIAN ASSISTANT: Margart SicklesJoshua Deanglo Hissong, PA-C  ASSISTANTS:  ANESTHESIA:   local, regional and general  EBL:  Total I/O In: 800 [I.V.:800] Out: 75 [Blood:75]  BLOOD ADMINISTERED:none  DRAINS: none   LOCAL MEDICATIONS USED:  MARCAINE     SPECIMEN:  No Specimen  DISPOSITION OF SPECIMEN:  N/A  COUNTS:  YES  TOURNIQUET:   Total Tourniquet Time Documented: Thigh (Right) - 59 minutes Total: Thigh (Right) - 59 minutes   DICTATION: .Other Dictation: Dictation Number unknown  PLAN OF CARE: Admit to inpatient   PATIENT DISPOSITION:  PACU - hemodynamically stable.   Delay start of Pharmacological VTE agent (>24hrs) due to surgical blood loss or risk of bleeding: yes

## 2015-10-16 NOTE — Discharge Instructions (Signed)

## 2015-10-16 NOTE — Interval H&P Note (Signed)
History and Physical Interval Note:  10/16/2015 8:20 AM  Sandy Hodges  has presented today for surgery, with the diagnosis of OA RIGHT KNEE  The various methods of treatment have been discussed with the patient and family. After consideration of risks, benefits and other options for treatment, the patient has consented to  Procedure(s): TOTAL KNEE ARTHROPLASTY (Right) as a surgical intervention .  The patient's history has been reviewed, patient examined, no change in status, stable for surgery.  I have reviewed the patient's chart and labs.  Questions were answered to the patient's satisfaction.     Mehkai Gallo JR,W D

## 2015-10-16 NOTE — Evaluation (Signed)
Physical Therapy Evaluation Patient Details Name: Sandy Hodges MRN: [id number] DOB: March 05, 1945 Today's Date: 10/16/2015   History of Present Illness  pt is a 70 y/o female with h/o bil knee dysfunction, HTN, rotator cuff repair, admitted for R knee pain and dysfunction, s/p R TKA.  Clinical Impression  Pt admitted with/for RTKA.  Pt currently limited functionally due to the problems listed below.  (see problems list.)  Pt will benefit from PT to maximize function and safety to be able to get home safely with available assist of family.     Follow Up Recommendations Home health PT    Equipment Recommendations  None recommended by PT    Recommendations for Other Services       Precautions / Restrictions Precautions Precautions: Knee;Fall Restrictions Weight Bearing Restrictions: Yes RLE Weight Bearing: Weight bearing as tolerated      Mobility  Bed Mobility Overal bed mobility: Needs Assistance Bed Mobility: Supine to Sit     Supine to sit: Supervision     General bed mobility comments: no assist needed  Transfers Overall transfer level: Needs assistance   Transfers: Sit to/from Stand Sit to Stand: Min guard         General transfer comment: cues for leg out.  Ambulation/Gait Ambulation/Gait assistance: Min guard Ambulation Distance (Feet): 200 Feet Assistive device: Rolling walker (2 wheeled) Gait Pattern/deviations: Step-through pattern   Gait velocity interpretation: at or above normal speed for age/gender General Gait Details: cues for sequencing.  Generally steady.  Stairs            Wheelchair Mobility    Modified Rankin (Stroke Patients Only)       Balance Overall balance assessment: No apparent balance deficits (not formally assessed)                                           Pertinent Vitals/Pain Pain Assessment: Faces Faces Pain Scale: Hurts little more Pain Location: R knee Pain Descriptors / Indicators:  Sore Pain Intervention(s): Monitored during session;Repositioned;Premedicated before session    Home Living Family/patient expects to be discharged to:: Private residence Living Arrangements: Spouse/significant other Available Help at Discharge: Family;Available 24 hours/day   Home Access: Stairs to enter Entrance Stairs-Rails: Right;Left Entrance Stairs-Number of Steps: several Home Layout: One level Home Equipment: Shower seat;Grab bars - tub/shower;Walker - 2 wheels;Bedside commode      Prior Function Level of Independence: Independent               Hand Dominance        Extremity/Trunk Assessment   Upper Extremity Assessment: Overall WFL for tasks assessed           Lower Extremity Assessment: Overall WFL for tasks assessed;RLE deficits/detail         Communication   Communication: No difficulties  Cognition Arousal/Alertness: Awake/alert Behavior During Therapy: WFL for tasks assessed/performed Overall Cognitive Status: Within Functional Limits for tasks assessed                      General Comments      Exercises Total Joint Exercises Ankle Circles/Pumps: AROM;20 reps;Supine Quad Sets: AROM;Both;10 reps;Supine Gluteal Sets: AROM;Both;10 reps;Supine Heel Slides: AROM;Right;10 reps;Supine Hip ABduction/ADduction: AROM;Right;10 reps;Supine Straight Leg Raises: AROM;Right;10 reps;Supine Knee Flexion: AROM;10 reps Goniometric ROM: 95   Assessment/Plan    PT Assessment Patient needs continued PT services  PT  Problem List Decreased strength;Decreased range of motion;Decreased mobility;Decreased knowledge of use of DME;Decreased knowledge of precautions;Pain          PT Treatment Interventions Gait training;DME instruction;Stair training;Functional mobility training;Therapeutic activities;Therapeutic exercise;Patient/family education    PT Goals (Current goals can be found in the Care Plan section)  Acute Rehab PT Goals Patient Stated  Goal: home independent PT Goal Formulation: With patient Time For Goal Achievement: 10/23/15 Potential to Achieve Goals: Good    Frequency 7X/week   Barriers to discharge        Co-evaluation               End of Session   Activity Tolerance: Patient tolerated treatment well Patient left: in bed;with call bell/phone within reach;with family/visitor present Nurse Communication: Mobility status         Time: [id number] PT Time Calculation (min) (ACUTE ONLY): 24 min   Charges:   PT Evaluation $PT Eval Low Complexity: 1 Procedure     PT G Codes:        Nikol Lemar, Eliseo GumKenneth V 10/16/2015, 4:56 PM  10/16/2015  Diablo BingKen Colbi Schiltz, PT [phone]  (pager)

## 2015-10-16 NOTE — Anesthesia Procedure Notes (Addendum)
Anesthesia Regional Block:  Adductor canal block  Pre-Anesthetic Checklist: ,, timeout performed, Correct Patient, Correct Site, Correct Laterality, Correct Procedure, Correct Position, site marked, Risks and benefits discussed,  Surgical consent,  Pre-op evaluation,  At surgeon's request and post-op pain management  Laterality: Right  Prep: chloraprep       Needles:  Injection technique: Single-shot  Needle Type: Echogenic Needle     Needle Length: 9cm 9 cm Needle Gauge: 22 and 22 G    Additional Needles:  Procedures: ultrasound guided (picture in chart) Adductor canal block Narrative:  Start time: 10/16/2015 8:40 AM End time: 10/16/2015 8:45 AM Injection made incrementally with aspirations every 5 mL.  Performed by: Personally   Additional Notes: 30 cc 0.5% bupivacaine with 1:200 Epi injected easily

## 2015-10-16 NOTE — Progress Notes (Signed)
Orthopedic Tech Progress Note Patient Details:  Sandy Hodges [birth year] [id number] Left Footsie Roll with patient. CPM Right Knee CPM Right Knee: On Right Knee Flexion (Degrees): 90 Right Knee Extension (Degrees): 0  Ortho Devices Ortho Device/Splint Location: OHF w/ Trapeze, footsie roll Ortho Device/Splint Interventions: Minda MeoOrdered   Dorraine Ellender S Earlyn Sylvan 10/16/2015, 12:24 PM

## 2015-10-16 NOTE — H&P (View-Only) (Signed)
TOTAL KNEE ADMISSION H&P  Patient is being admitted for right total knee arthroplasty.  Subjective:  Chief Complaint:right knee pain.  HPI: Sandy Hodges, 70 y.o. female, has a history of pain and functional disability in the right knee due to arthritis and has failed non-surgical conservative treatments for greater than 12 weeks to includeNSAID's and/or analgesics, corticosteriod injections, viscosupplementation injections and activity modification.  Onset of symptoms was gradual, starting >10 years ago with gradually worsening course since that time. The patient noted no past surgery on the right knee(s).  Patient currently rates pain in the right knee(s) at 10 out of 10 with activity. Patient has night pain, worsening of pain with activity and weight bearing, pain that interferes with activities of daily living, pain with passive range of motion, crepitus and joint swelling.  Patient has evidence of periarticular osteophytes and joint space narrowing by imaging studies.There is no active infection.  Patient Active Problem List   Diagnosis Date Noted  . Depression 10/16/2014  . Arthritis 11/14/2013  . Essential hypertension 11/14/2013  . Preventive measure 11/14/2013  . Uterine mass 08/01/2013  . Deficiency anemia 07/09/2013  . Fatigue 07/09/2013  . Knee pain 07/09/2013  . Back pain 07/09/2013  . Abdominal pain, unspecified site 07/09/2013  . Leukopenia 07/09/2013  . Flank pain 07/09/2013  . Pancytopenia (HCC) 12/14/2011   Past Medical History:  Diagnosis Date  . Abdominal pain, unspecified site 07/09/2013  . Arthritis 11/14/2013  . Back pain 07/09/2013  . Depression 10/16/2014  . Essential hypertension 11/14/2013  . Fatigue 07/09/2013  . Fibromyalgia   . Hyperlipidemia   . Hypertension   . Knee pain 07/09/2013  . OA (osteoarthritis) of knee   . Pancytopenia (HCC) 12/14/2011  . Unspecified deficiency anemia 07/09/2013    Past Surgical History:  Procedure Laterality Date  .  COLONOSCOPY       (Not in a hospital admission) No Known Allergies  Social History  Substance Use Topics  . Smoking status: Never Smoker  . Smokeless tobacco: Never Used  . Alcohol use No    Family History  Problem Relation Age of Onset  . Cancer Mother     breast ca  . Hypertension Father   . CVA Father      Review of Systems  Constitutional: Positive for weight loss.  Musculoskeletal: Positive for joint pain.  All other systems reviewed and are negative.   Objective:  Physical Exam  Constitutional: She is oriented to person, place, and time. She appears well-developed and well-nourished. No distress.  HENT:  Head: Normocephalic and atraumatic.  Nose: Nose normal.  Eyes: Conjunctivae and EOM are normal. Pupils are equal, round, and reactive to light.  Neck: Normal range of motion. Neck supple.  Cardiovascular: Normal rate, regular rhythm, normal heart sounds and intact distal pulses.   Respiratory: Effort normal and breath sounds normal. No respiratory distress. She has no wheezes. She has no rales. She exhibits no tenderness.  GI: Soft. Bowel sounds are normal. She exhibits no distension. There is no tenderness.  Musculoskeletal:       Right knee: She exhibits swelling. She exhibits normal range of motion, no LCL laxity and no MCL laxity. Tenderness found. Medial joint line tenderness noted.  Lymphadenopathy:    She has no cervical adenopathy.  Neurological: She is alert and oriented to person, place, and time. No cranial nerve deficit.  Skin: Skin is warm and dry. No rash noted. No erythema.  Psychiatric: She has a normal mood and  affect. Her behavior is normal.    Vital signs in last 24 hours: @VSRANGES @  Labs:   Estimated body mass index is 24.66 kg/m as calculated from the following:   Height as of 11/28/14: 5\' 5"  (1.651 m).   Weight as of 11/28/14: 67.2 kg (148 lb 3.2 oz).   Imaging Review Plain radiographs demonstrate severe degenerative joint  disease of the right knee(s). The overall alignment ismild varus. The bone quality appears to be good for age and reported activity level.  Assessment/Plan:  End stage arthritis, right knee   The patient history, physical examination, clinical judgment of the provider and imaging studies are consistent with end stage degenerative joint disease of the right knee(s) and total knee arthroplasty is deemed medically necessary. The treatment options including medical management, injection therapy arthroscopy and arthroplasty were discussed at length. The risks and benefits of total knee arthroplasty were presented and reviewed. The risks due to aseptic loosening, infection, stiffness, patella tracking problems, thromboembolic complications and other imponderables were discussed. The patient acknowledged the explanation, agreed to proceed with the plan and consent was signed. Patient is being admitted for inpatient treatment for surgery, pain control, PT, OT, prophylactic antibiotics, VTE prophylaxis, progressive ambulation and ADL's and discharge planning. The patient is planning to be discharged home with home health services

## 2015-10-16 NOTE — Anesthesia Postprocedure Evaluation (Signed)
Anesthesia Post Note  Patient: Sandy Hodges  Procedure(s) Performed: Procedure(s) (LRB): TOTAL KNEE ARTHROPLASTY (Right)  Patient location during evaluation: PACU Anesthesia Type: General and Regional Level of consciousness: awake, oriented and awake and alert Pain management: pain level controlled Vital Signs Assessment: post-procedure vital signs reviewed and stable Respiratory status: spontaneous breathing, nonlabored ventilation and respiratory function stable Cardiovascular status: blood pressure returned to baseline Anesthetic complications: no    Last Vitals:  Vitals:   10/16/15 1400 10/16/15 1705  BP: (!) 148/74 (!) 156/70  Pulse: 74 81  Resp: 16   Temp: 36.7 C     Last Pain:  Vitals:   10/16/15 1707  TempSrc:   PainSc: 3                  Jalyn Dutta COKER

## 2015-10-16 NOTE — Anesthesia Procedure Notes (Signed)
Procedure Name: LMA Insertion Date/Time: 10/16/2015 9:26 AM Performed by: Fransisca KaufmannMEYER, Shareta Fishbaugh E Pre-anesthesia Checklist: Patient identified, Emergency Drugs available, Suction available and Patient being monitored Patient Re-evaluated:Patient Re-evaluated prior to inductionOxygen Delivery Method: Circle System Utilized Preoxygenation: Pre-oxygenation with 100% oxygen Intubation Type: IV induction Ventilation: Mask ventilation without difficulty LMA: LMA inserted LMA Size: 4.0 Number of attempts: 1 Placement Confirmation: positive ETCO2 Tube secured with: Tape Dental Injury: Teeth and Oropharynx as per pre-operative assessment

## 2015-10-16 NOTE — Anesthesia Procedure Notes (Signed)
Anesthesia Regional Block: Narrative:       

## 2015-10-16 NOTE — Transfer of Care (Signed)
Immediate Anesthesia Transfer of Care Note  Patient: Sandy Hodges  Procedure(s) Performed: Procedure(s): TOTAL KNEE ARTHROPLASTY (Right)  Patient Location: PACU  Anesthesia Type:General and Regional  Level of Consciousness: awake, alert , oriented and sedated  Airway & Oxygen Therapy: Patient Spontanous Breathing and Patient connected to nasal cannula oxygen  Post-op Assessment: Report given to RN, Post -op Vital signs reviewed and stable and Patient moving all extremities  Post vital signs: Reviewed and stable  Last Vitals:  Vitals:   10/16/15 0855 10/16/15 0900  BP: (!) 140/59 130/64  Pulse: 65 70  Resp: 17 19  Temp:      Last Pain:  Vitals:   10/16/15 0820  TempSrc: Oral         Complications: No apparent anesthesia complications

## 2015-10-16 NOTE — Care Management Note (Signed)
Case Management Note  Patient Details  Name: Sandy Hodges MRN: [id number] Date of Birth: 04/11/1945  Subjective/Objective:      S/p right TKA              Action Plan: Discharge Planning:  NCM spoke to pt and husband, sons at bedside. [facility]. Pt states she has received RW, 3n1 and CPM at home from [facility]. Preoperative arranged with [facility].    Expected Discharge Date:                Expected Discharge Plan:  Home w Home Health Services  In-House Referral:  NA  Discharge planning Services  CM Consult  Post Acute Care Choice:  Home Health Choice offered to:  Patient  DME Arranged:  3-N-1, Walker rolling, CPM DME Agency:  [employer]  HH Arranged:  [facility]  Status of Service:  Completed, signed off  If discussed at [employer] Length of Stay Meetings, dates discussed:    Additional Comments:  [name], [name], RN 10/16/2015, 6:08 PM

## 2015-10-16 NOTE — Anesthesia Preprocedure Evaluation (Addendum)
Anesthesia Evaluation  Patient identified by MRN, date of birth, ID band Patient awake    Reviewed: Allergy & Precautions, H&P , NPO status , Patient's Chart, lab work & pertinent test results, reviewed documented beta blocker date and time   History of Anesthesia Complications (+) PONV and history of anesthetic complications  Airway Mallampati: II  TM Distance: >3 FB Neck ROM: Full    Dental  (+) Teeth Intact, Dental Advisory Given   Pulmonary neg pulmonary ROS,    breath sounds clear to auscultation       Cardiovascular hypertension, On Medications + dysrhythmias  Rhythm:Regular Rate:Normal     Neuro/Psych PSYCHIATRIC DISORDERS  Neuromuscular disease    GI/Hepatic   Endo/Other  negative endocrine ROS  Renal/GU negative Renal ROS     Musculoskeletal   Abdominal   Peds  Hematology  (+) anemia ,   Anesthesia Other Findings   Reproductive/Obstetrics negative OB ROS                           Anesthesia Physical Anesthesia Plan  ASA: III  Anesthesia Plan: General and Regional   Post-op Pain Management:    Induction: Intravenous  Airway Management Planned: LMA  Additional Equipment:   Intra-op Plan:   Post-operative Plan:   Informed Consent:   Plan Discussed with: CRNA, Anesthesiologist and Surgeon  Anesthesia Plan Comments:         Anesthesia Quick Evaluation

## 2015-10-17 LAB — CBC
HEMATOCRIT: 27.3 % — AB (ref 36.0–46.0)
HEMOGLOBIN: 8.5 g/dL — AB (ref 12.0–15.0)
MCH: 30 pg (ref 26.0–34.0)
MCHC: 31.1 g/dL (ref 30.0–36.0)
MCV: 96.5 fL (ref 78.0–100.0)
Platelets: 110 10*3/uL — ABNORMAL LOW (ref 150–400)
RBC: 2.83 MIL/uL — ABNORMAL LOW (ref 3.87–5.11)
RDW: 13 % (ref 11.5–15.5)
WBC: 4.6 10*3/uL (ref 4.0–10.5)

## 2015-10-17 LAB — BASIC METABOLIC PANEL
Anion gap: 5 (ref 5–15)
BUN: 12 mg/dL (ref 6–20)
CHLORIDE: 108 mmol/L (ref 101–111)
CO2: 26 mmol/L (ref 22–32)
CREATININE: 0.95 mg/dL (ref 0.44–1.00)
Calcium: 8.9 mg/dL (ref 8.9–10.3)
GFR calc Af Amer: >60 mL/min (ref >60)
GFR calc non Af Amer: 59 mL/min — ABNORMAL LOW (ref >60)
GLUCOSE: 114 mg/dL — AB (ref 65–99)
Potassium: 3.8 mmol/L (ref 3.5–5.1)
Sodium: 139 mmol/L (ref 135–145)

## 2015-10-17 NOTE — Evaluation (Signed)
Occupational Therapy Evaluation Patient Details Name: Sandy Hodges MRN: [id number] DOB: 02-Dec-1945 Today's Date: 10/17/2015    History of Present Illness pt is a 70 y/o female with h/o bil knee dysfunction, HTN, rotator cuff repair, admitted for R knee pain and dysfunction, s/p R TKA.   Clinical Impression   Pt is at min A level with LB ADLs and sup with ADL mobility and transfers using RW and DME. Pt will have 24/7 assist at home from her husband and has all necessary DME and A/E. All education completed and no further acute OT is indicated at this time    Follow Up Recommendations  No OT follow up;Supervision - Intermittent    Equipment Recommendations  None recommended by OT    Recommendations for Other Services       Precautions / Restrictions Precautions Precautions: Knee;Fall Restrictions Weight Bearing Restrictions: Yes RLE Weight Bearing: Weight bearing as tolerated      Mobility Bed Mobility Overal bed mobility: Needs Assistance Bed Mobility: Supine to Sit     Supine to sit: Supervision     General bed mobility comments: pt up in recliner  Transfers Overall transfer level: Needs assistance Equipment used: Rolling walker (2 wheeled) Transfers: Sit to/from Stand Sit to Stand: Supervision         General transfer comment: able to move leg out independently     Balance Overall balance assessment: No apparent balance deficits (not formally assessed)                                          ADL Overall ADL's : Needs assistance/impaired     Grooming: Wash/dry hands;Wash/dry face;Supervision/safety;Standing   Upper Body Bathing: Set up;Sitting   Lower Body Bathing: Minimal assistance   Upper Body Dressing : Set up;Sitting   Lower Body Dressing: Minimal assistance   Toilet Transfer: Supervision/safety   Toileting- Clothing Manipulation and Hygiene: Min guard;Sit to/from stand   Tub/ Engineer, structuralhower Transfer: 3 in  1;Supervision/safety;Grab bars   Functional mobility during ADLs: Supervision/safety       Vision Vision Assessment?: No apparent visual deficits              Pertinent Vitals/Pain Pain Assessment: 0-10 Pain Score: 5  Faces Pain Scale: Hurts little more Pain Location: R knee Pain Descriptors / Indicators: Sore;Throbbing Pain Intervention(s): Monitored during session;Premedicated before session;Repositioned     Hand Dominance Right   Extremity/Trunk Assessment Upper Extremity Assessment Upper Extremity Assessment: Overall WFL for tasks assessed   Lower Extremity Assessment Lower Extremity Assessment: Defer to PT evaluation   Cervical / Trunk Assessment Cervical / Trunk Assessment: Normal   Communication Communication Communication: No difficulties   Cognition Arousal/Alertness: Awake/alert Behavior During Therapy: WFL for tasks assessed/performed Overall Cognitive Status: Within Functional Limits for tasks assessed                     General Comments   pt very pleasant and cooperative, husband very supportive                Home Living Family/patient expects to be discharged to:: Private residence Living Arrangements: Spouse/significant other Available Help at Discharge: Family;Available 24 hours/day Type of Home: House Home Access: Stairs to enter Entergy CorporationEntrance Stairs-Number of Steps: several Entrance Stairs-Rails: Right;Left Home Layout: One level     Bathroom Shower/Tub: Producer, television/film/videoWalk-in shower   Bathroom Toilet: Standard  Home Equipment: Shower seat;Grab bars - tub/shower;Walker - 2 wheels;Bedside commode          Prior Functioning/Environment Level of Independence: Independent                 OT Problem List: Pain   OT Treatment/Interventions:      OT Goals(Current goals can be found in the care plan section) Acute Rehab OT Goals Patient Stated Goal: home OT Goal Formulation: With patient/family  OT Frequency:     Barriers to  D/C:    no barriers, husband will assist 24/7                     End of Session Equipment Utilized During Treatment: Rolling walker CPM Right Knee CPM Right Knee: Off  Activity Tolerance: Patient tolerated treatment well Patient left: in chair;with call bell/phone within reach;with family/visitor present   Time: 1610-9604 OT Time Calculation (min): 31 min Charges:  OT General Charges $OT Visit: 1 Procedure OT Evaluation $OT Eval Moderate Complexity: 1 Procedure OT Treatments $Self Care/Home Management : 8-22 mins $Therapeutic Activity: 8-22 mins G-Codes:    Galen Manila 10/17/2015, 11:49 AM

## 2015-10-17 NOTE — Discharge Summary (Signed)
PATIENT ID: Sandy Hodges        MRN:  [id number]          DOB/AGE: 1945/04/01 / 70 y.o.    DISCHARGE SUMMARY  ADMISSION DATE:    10/16/2015 DISCHARGE DATE:   10/17/2015   ADMISSION DIAGNOSIS: OA RIGHT KNEE    DISCHARGE DIAGNOSIS:  OA RIGHT KNEE    ADDITIONAL DIAGNOSIS: Active Problems:   Primary localized osteoarthritis of right knee  Past Medical History:  Diagnosis Date  . Abdominal pain, unspecified site 07/09/2013  . Arthritis 11/14/2013  . Back pain 07/09/2013  . Depression 10/16/2014  . Essential hypertension 11/14/2013  . Fatigue 07/09/2013  . Fibromyalgia   . Hyperlipidemia   . Hypertension   . Knee pain 07/09/2013  . OA (osteoarthritis) of knee   . Pancytopenia (HCC) 12/14/2011  . Pancytopenia (HCC)   . PONV (postoperative nausea and vomiting)   . Unspecified deficiency anemia 07/09/2013    PROCEDURE: Procedure(s): TOTAL KNEE ARTHROPLASTY Right on 10/16/2015  CONSULTS: pt/ot    HISTORY:  See H&P in chart  HOSPITAL COURSE:  Sandy Hodges is a 70 y.o. admitted on 10/16/2015 and found to have a diagnosis of OA RIGHT KNEE.  After appropriate laboratory studies were obtained  they were taken to the operating room on 10/16/2015 and underwent  Procedure(s): TOTAL KNEE ARTHROPLASTY  Right.   They were given perioperative antibiotics:  Anti-infectives    Start     Dose/Rate Route Frequency Ordered Stop   10/16/15 1600  ceFAZolin (ANCEF) IVPB 1 g/50 mL premix     1 g 100 mL/hr over 30 Minutes Intravenous Every 6 hours 10/16/15 1456 10/16/15 2204   10/16/15 0742  ceFAZolin (ANCEF) IVPB 2g/100 mL premix     2 g 200 mL/hr over 30 Minutes Intravenous On call to O.R. 10/16/15 7829 10/16/15 0920    .  Tolerated the procedure well.    POD #1, allowed out of bed to a chair.  PT for ambulation and exercise program. IV saline locked.  O2 discontionued.  The remainder of the hospital course was dedicated to ambulation and strengthening.   The patient was discharged on  1 Day Post-Op in  Stable condition.  Blood products given:none  DIAGNOSTIC STUDIES: Recent vital signs:  Patient Vitals for the past 24 hrs:  BP Temp Temp src Pulse Resp SpO2  10/17/15 0428 111/62 98.4 F (36.9 C) Oral 60 16 97 %  10/17/15 0038 124/60 99 F (37.2 C) Oral 68 16 96 %  10/16/15 2049 123/68 98.6 F (37 C) Oral 68 16 97 %  10/16/15 1705 (!) 156/70 - - 81 - -  10/16/15 1400 (!) 148/74 98 F (36.7 C) - 74 16 95 %  10/16/15 1345 - - - 76 19 91 %  10/16/15 1300 (!) 157/78 - - 78 18 98 %  10/16/15 1245 (!) 156/80 - - 74 17 99 %  10/16/15 1230 (!) 152/80 - - 74 17 99 %  10/16/15 1215 (!) 155/83 - - 73 17 97 %  10/16/15 1200 (!) 146/86 - - 78 18 98 %  10/16/15 1139 (!) 154/76 - - 76 16 98 %  10/16/15 1124 (!) 170/78 98.4 F (36.9 C) - 84 (!) 9 97 %       Recent laboratory studies:  Recent Labs  10/17/15 0540  WBC 4.6  HGB 8.5*  HCT 27.3*  PLT 110*    Recent Labs  10/17/15 0540  NA 139  K 3.8  CL 108  CO2 26  BUN 12  CREATININE 0.95  GLUCOSE 114*  CALCIUM 8.9   Lab Results  Component Value Date   INR 1.14 10/05/2015     Recent Radiographic Studies :  Dg Chest 2 View  Result Date: 10/05/2015 CLINICAL DATA:  Total knee replacement. EXAM: CHEST  2 VIEW COMPARISON:  11/03/2008 . FINDINGS: Mediastinum and hilar structures normal. Lungs are clear. Cardiomegaly with normal pulmonary vascularity. Small left pleural effusion. No pneumothorax. Degenerative changes thoracic spine. Pectus deformity noted . IMPRESSION: 1.  Small left pleural effusion. 2. Cardiomegaly, no pulmonary venous congestion. Electronically Signed   By: Maisie Fus  Register   On: 10/05/2015 13:24    DISCHARGE INSTRUCTIONS:   DISCHARGE MEDICATIONS:     Medication List    STOP taking these medications   aspirin EC 81 MG tablet   diclofenac 75 MG EC tablet Commonly known as:  VOLTAREN     TAKE these medications   ALPRAZolam 1 MG tablet Commonly known as:  XANAX Take 0.5-1 mg by  mouth 3 (three) times daily. 0.5 mg twice daily and 1 mg at bedtime.   apixaban 2.5 MG Tabs tablet Commonly known as:  ELIQUIS Take 1 tablet (2.5 mg total) by mouth 2 (two) times daily.   atenolol 50 MG tablet Commonly known as:  TENORMIN Take 1 tablet by mouth every evening.   calcium carbonate 600 MG Tabs tablet Commonly known as:  OS-CAL Take 600-900 mg by mouth 2 (two) times daily with a meal. Takes 1 & 1/2 tab in the evening & 1 tab in the morning.   fenofibrate 145 MG tablet Commonly known as:  TRICOR Take 145 mg by mouth every evening.   ferrous sulfate 325 (65 FE) MG tablet Take 325 mg by mouth daily with breakfast.   fish oil-omega-3 fatty acids 1000 MG capsule Take 2 g by mouth 2 (two) times daily.   HYDROcodone-acetaminophen 5-325 MG tablet Commonly known as:  NORCO Take 1-2 tablets by mouth every 4 (four) hours as needed for moderate pain.   lisinopril-hydrochlorothiazide 20-12.5 MG tablet Commonly known as:  PRINZIDE,ZESTORETIC Take 1 tablet by mouth every evening.   loratadine 10 MG tablet Commonly known as:  CLARITIN Take 10 mg by mouth every evening.   multivitamin with minerals Tabs tablet Take 1 tablet by mouth daily.   omeprazole 10 MG capsule Commonly known as:  PRILOSEC Take 10 mg by mouth every evening.   ondansetron 4 MG tablet Commonly known as:  ZOFRAN Take 1 tablet (4 mg total) by mouth every 8 (eight) hours as needed for nausea or vomiting.   simvastatin 20 MG tablet Commonly known as:  ZOCOR Take 20 mg by mouth every evening.   venlafaxine XR 75 MG 24 hr capsule Commonly known as:  EFFEXOR-XR Take 75 mg by mouth daily.   Vitamin D-3 1000 units Caps Take 1,000 Units by mouth every evening.       FOLLOW UP VISIT:   Follow-up Information    CAFFREY JR,W D, MD. Schedule an appointment as soon as possible for a visit in 2 week(s).   Specialty:  Orthopedic Surgery Contact information: [address] [place]  [place] [address] [phone]        [facility] .   Why:  [facility]Home Health Physical Therapy will contact you at home. Contact information: [address] [place] [address] [phone]  DISPOSITION:   Home  CONDITION:  Stable   Margart SicklesJoshua Levaughn Puccinelli, PA-C  10/17/2015 9:25 AM

## 2015-10-17 NOTE — Plan of Care (Signed)
Physical Therapy Treatment Patient Details Name: Sandy Hodges MRN: [id number] DOB: 1945-09-05 Today's Date: 10/17/2015    History of Present Illness pt is a 70 y/o female with h/o bil knee dysfunction, HTN, rotator cuff repair, admitted for R knee pain and dysfunction, s/p R TKA.    PT Comments    Patient was able to perform steps with min guard and cuing for proper gait pattern. She had no increase in pain. After performing stairs she was able to walk 150'/ Therapy spent time reviewing her exercises with the patient and the patients husband. The patient is doing very well. She would benefit from further therapy with home health upon discharge.   Follow Up Recommendations  Home health PT     Equipment Recommendations  None recommended by PT    Recommendations for Other Services       Precautions / Restrictions Precautions Precautions: Knee;Fall Restrictions RLE Weight Bearing: Weight bearing as tolerated    Mobility  Bed Mobility Overal bed mobility: Needs Assistance Bed Mobility: Supine to Sit     Supine to sit: Supervision     General bed mobility comments: no assist needed  Transfers Overall transfer level: Needs assistance     Sit to Stand: Supervision         General transfer comment: able to move leg out independently   Ambulation/Gait Ambulation/Gait assistance: Supervision Ambulation Distance (Feet): 150 Feet Assistive device: Rolling walker (2 wheeled)       General Gait Details: cues for sequencing.  Generally steady.   Stairs Stairs: Yes Stairs assistance: Min guard Stair Management: One rail Right Number of Stairs: 8 General stair comments: Educated husband and patient on proper guartding and proper technique. Cuing for technique on first round of steps but no cuing required on the second   Wheelchair Mobility    Modified Rankin (Stroke Patients Only)       Balance                                    Cognition  Arousal/Alertness: Awake/alert Behavior During Therapy: WFL for tasks assessed/performed Overall Cognitive Status: Within Functional Limits for tasks assessed                      Exercises Total Joint Exercises Ankle Circles/Pumps: AROM;20 reps;Supine Quad Sets: AROM;Both;10 reps;Supine Gluteal Sets: AROM;Both;10 reps;Supine Heel Slides: AROM;Right;10 reps;Supine Hip ABduction/ADduction: AROM;Right;10 reps;Supine Straight Leg Raises: AROM;Right;10 reps;Supine Knee Flexion: AROM;10 reps Goniometric ROM: 97    General Comments General comments (skin integrity, edema, etc.): reviewed all exercises with the patient's husband. Also reviewed safety tips at home with the husabnd..       Pertinent Vitals/Pain Pain Assessment: 0-10 Pain Score: 4  Faces Pain Scale: Hurts little more Pain Location: R knee Pain Descriptors / Indicators: Sore Pain Intervention(s): Monitored during session;Premedicated before session;Limited activity within patient's tolerance;Repositioned    Home Living                      Prior Function            PT Goals (current goals can now be found in the care plan section) Acute Rehab PT Goals Patient Stated Goal: home independent PT Goal Formulation: With patient Time For Goal Achievement: 10/23/15 Potential to Achieve Goals: Good    Frequency    7X/week      PT Plan  Co-evaluation             End of Session   Activity Tolerance: Patient tolerated treatment well Patient left: in bed;with call bell/phone within reach;with family/visitor present     Time: 0927-1008 PT Time Calculation (min) (ACUTE ONLY): 41 min  Charges:  $Gait Training: 8-22 mins $Therapeutic Exercise: 8-22 mins $Therapeutic Activity: 8-22 mins (Stair training )                    G Codes:      Sandy Hodges Sandy Hodges PT DPT  10/17/2015, 11:35 AM

## 2015-10-17 NOTE — Progress Notes (Signed)
Orthopedic Tech Progress Note Patient Details:  Sandy Hodges 02/20/1945 [id number]  Patient ID: Sandy Hodges, female   DOB: 02/20/1945, 70 y.o.   MRN: [id number] Pt already in cpm  Trinna PostMartinez, Tyreque Finken J 10/17/2015, 6:07 AM

## 2015-10-17 NOTE — Progress Notes (Signed)
Discharge instruction provided to pt and all questions answered. Dressing changed per MD order. Pt is ready to discharge.

## 2015-10-17 NOTE — Progress Notes (Signed)
Subjective: 1 Day Post-Op Procedure(s) (LRB): TOTAL KNEE ARTHROPLASTY (Right) Patient reports pain as mild and moderate.    Objective: Vital signs in last 24 hours: Temp:  [98 F (36.7 C)-99 F (37.2 C)] 98.4 F (36.9 C) (09/30 0428) Pulse Rate:  [60-84] 60 (09/30 0428) Resp:  [9-19] 16 (09/30 0428) BP: (111-170)/(60-86) 111/62 (09/30 0428) SpO2:  [91 %-99 %] 97 % (09/30 0428)  Intake/Output from previous day: 09/29 0701 - 09/30 0700 In: 1171.3 [I.V.:901.3; IV Piggyback:270] Out: 75 [Blood:75] Intake/Output this shift: No intake/output data recorded.   Recent Labs  10/17/15 0540  HGB 8.5*    Recent Labs  10/17/15 0540  WBC 4.6  RBC 2.83*  HCT 27.3*  PLT 110*    Recent Labs  10/17/15 0540  NA 139  K 3.8  CL 108  CO2 26  BUN 12  CREATININE 0.95  GLUCOSE 114*  CALCIUM 8.9   No results for input(s): LABPT, INR in the last 72 hours.  Neurovascular intact Sensation intact distally Intact pulses distally Dorsiflexion/Plantar flexion intact Incision: dressing C/D/I  Assessment/Plan: 1 Day Post-Op Procedure(s) (LRB): TOTAL KNEE ARTHROPLASTY (Right) Up with therapy Discharge home with home health  Pt doing well feels she may go home today, will change dsg prior to d/c   Sandy Hodges, Sandy Hodges 10/17/2015, 9:19 AM

## 2015-10-19 ENCOUNTER — Encounter (HOSPITAL_COMMUNITY): Payer: Self-pay | Admitting: Orthopedic Surgery

## 2015-10-19 NOTE — Op Note (Signed)
NAME:  Sandy Hodges, Sandy Hodges              ACCOUNT NO.:  [id number]  MEDICAL RECORD NO.:  [id number]  LOCATION:                                 FACILITY:  PHYSICIAN:  Dyke BrackettW. D. Eunice Winecoff, M.D.    DATE OF BIRTH:  1945-01-22  DATE OF PROCEDURE:  10/16/2015 DATE OF DISCHARGE:                              OPERATIVE REPORT   PREOPERATIVE DIAGNOSIS:  Severe osteoarthritis, right knee with a flexion contracture, varus deformity.  POSTOPERATIVE DIAGNOSIS:  Severe osteoarthritis, right knee with a flexion contracture, varus deformity.  OPERATION:  Right total knee replacement (Sigma size 3 femur tibia, 10 mm bearing, 35 mm all-poly patella).  ANESTHESIA:  General with a nerve block.  TOURNIQUET TIME:  59 minutes.  ASSISTANT:  [place], PA.  DESCRIPTION OF PROCEDURE:  Supine position, exsanguination of leg, inflation of 350.  Straight skin incision with medial parapatellar approach to the knee made.  We identified the distal femur cut at 5 degree, 11 mm valgus cut on the distal femur.  We __________ most diseased medial compartment, cutting about 4 mm below the most diseased medial compartment with about a 2 to 3 degree posterior slope. Extension gap was measured at 10 mm.  The femur was sized to be a size 3.  We placed the all-in-one cutting block in appropriate degree of external rotation and accomplished the anterior-posterior chamber cuts. PCL was released with stripping the posterior capsule and loose bodies and osteophytes of the posterior aspect of the knee.  Attention was next directed to the tibia.  Keel hole was cut for the tibia with the tibial base plate trial placed.  Box was cut for the femur.  Femoral trial and tibial trial were inserted with the 10 mm bearing with full extension resolution of the varus deformity.  Good stability with the knee noted with flexion to 90 degrees.  Patella was cut leaving about 16 to 17 mm of native patella and an all-poly 35 mm trial placed.  Again  excellent stability was noted.  Bearing was very stable.  Full extension was noted as well with the trial.  We infiltrated the knee capsule, subcutaneous tissues with a mixture of Exparel and Marcaine.  Final components were inserted, tibia followed by femur patella.  Cement was allowed to harden.  Excess cement was removed from the knee.  We would like to use a trial bearing while the cement hardened.  Trial bearing was removed. Small bits of cement were removed.  Tourniquet was released after the cement hardened.  No excessive bleeding was noted.  Final bearing was placed.  Closure was affected with #1 Ethibond, 2-0 Vicryl, and skin clips.  Taken to recovery room in stable condition.     Dyke BrackettW. D. Nakya Weyand, M.D.   ______________________________ Dyke BrackettW. D. Linzie Criss, M.D.    WDC/MEDQ  D:  10/16/2015  T:  10/17/2015  Job:  [id number]

## 2015-11-03 ENCOUNTER — Telehealth: Payer: Self-pay | Admitting: *Deleted

## 2015-11-03 NOTE — Telephone Encounter (Signed)
Pt is fine with changing appts from 11/10- 11/6.  Message to scheduler

## 2015-11-03 NOTE — Telephone Encounter (Signed)
-----   Message from Artis DelayNi Gorsuch, MD sent at 11/03/2015 10:23 AM EDT ----- Regarding: ok to call tomorrow I am trying to decompress my clinic on 11/10 Can you ask if it's OK to move her appt from 11/10 to 11/6 along with labs so I can see both her and husband together? If OK, please send scheduling msg

## 2015-11-23 ENCOUNTER — Ambulatory Visit (HOSPITAL_BASED_OUTPATIENT_CLINIC_OR_DEPARTMENT_OTHER): Payer: Medicare Other | Admitting: Hematology and Oncology

## 2015-11-23 ENCOUNTER — Encounter: Payer: Self-pay | Admitting: Hematology and Oncology

## 2015-11-23 ENCOUNTER — Other Ambulatory Visit (HOSPITAL_BASED_OUTPATIENT_CLINIC_OR_DEPARTMENT_OTHER): Payer: Medicare Other

## 2015-11-23 DIAGNOSIS — I10 Essential (primary) hypertension: Secondary | ICD-10-CM

## 2015-11-23 DIAGNOSIS — D61818 Other pancytopenia: Secondary | ICD-10-CM | POA: Diagnosis not present

## 2015-11-23 DIAGNOSIS — D649 Anemia, unspecified: Secondary | ICD-10-CM | POA: Diagnosis not present

## 2015-11-23 LAB — CBC WITH DIFFERENTIAL/PLATELET
BASO%: 1.6 % (ref 0.0–2.0)
Basophils Absolute: 0.1 10*3/uL (ref 0.0–0.1)
EOS ABS: 0.2 10*3/uL (ref 0.0–0.5)
EOS%: 4 % (ref 0.0–7.0)
HEMATOCRIT: 34.9 % (ref 34.8–46.6)
HEMOGLOBIN: 11.1 g/dL — AB (ref 11.6–15.9)
LYMPH#: 1.2 10*3/uL (ref 0.9–3.3)
LYMPH%: 33 % (ref 14.0–49.7)
MCH: 31.1 pg (ref 25.1–34.0)
MCHC: 31.8 g/dL (ref 31.5–36.0)
MCV: 97.8 fL (ref 79.5–101.0)
MONO#: 0.3 10*3/uL (ref 0.1–0.9)
MONO%: 8.5 % (ref 0.0–14.0)
NEUT%: 52.9 % (ref 38.4–76.8)
NEUTROS ABS: 2 10*3/uL (ref 1.5–6.5)
NRBC: 0 % (ref 0–0)
PLATELETS: 123 10*3/uL — AB (ref 145–400)
RBC: 3.57 10*6/uL — ABNORMAL LOW (ref 3.70–5.45)
RDW: 13.5 % (ref 11.2–14.5)
WBC: 3.8 10*3/uL — AB (ref 3.9–10.3)

## 2015-11-23 NOTE — Assessment & Plan Note (Signed)
She has chronic pancytopenia for long time. This is likely multifactorial from anemia of chronic disease and mild iron deficiency. Her white blood cell counts and platelet counts fluctuate up and down She had extensive evaluation in the past with no definitive cause found. She is not symptomatic and I recommend close observation.    

## 2015-11-23 NOTE — Progress Notes (Signed)
[facility] OFFICE PROGRESS NOTE  Emeterio ReeveWOLTERS,SHARON A, MD SUMMARY OF HEMATOLOGIC HISTORY:  This patient had extensive evaluation 2 years ago for fluctuation of her blood work up with chronic leukopenia and anemia. She also had abnormal CT scan and MRI of the back. She complained of severe arthritis/bone pain in her mid back which correlated with the abnormal changes seen on MRI several years ago. She also have severe pain in both knees and her right tibia. She also has fatigue and lack of energy and chronic cramps. In June 2015, she had MRI of the pack, CT scan of the abdomen, and extensive blood work which excluded diagnosis of cancer. She was found to have mild iron deficiency anemia and was recommended oral ion supplements. INTERVAL HISTORY: Sandy Hodges 70 y.o. female returns for follow-up. She had recent knee surgery, appears to recover well She denies recent infection The patient denies any recent signs or symptoms of bleeding such as spontaneous epistaxis, hematuria or hematochezia. She continues to have anxiety/depression related to her husband's medical illness  I have reviewed the past medical history, past surgical history, social history and family history with the patient and they are unchanged from previous note.  ALLERGIES:  has No Known Allergies.  MEDICATIONS:  Current Outpatient Prescriptions  Medication Sig Dispense Refill  . ALPRAZolam (XANAX) 1 MG tablet Take 0.5-1 mg by mouth 3 (three) times daily. 0.5 mg twice daily and 1 mg at bedtime.  0  . atenolol (TENORMIN) 50 MG tablet Take 1 tablet by mouth every evening.     . calcium carbonate (OS-CAL) 600 MG TABS Take 600-900 mg by mouth 2 (two) times daily with a meal. Takes 1 & 1/2 tab in the evening & 1 tab in the morning.    . Cholecalciferol (VITAMIN D-3) 1000 UNITS CAPS Take 1,000 Units by mouth every evening.     . fenofibrate (TRICOR) 145 MG tablet Take 145 mg by mouth every evening.     . ferrous  sulfate 325 (65 FE) MG tablet Take 325 mg by mouth daily with breakfast.    . fish oil-omega-3 fatty acids 1000 MG capsule Take 2 g by mouth 2 (two) times daily.     Marland Kitchen. lisinopril-hydrochlorothiazide (PRINZIDE,ZESTORETIC) 20-12.5 MG per tablet Take 1 tablet by mouth every evening.   0  . loratadine (CLARITIN) 10 MG tablet Take 10 mg by mouth every evening.     . Multiple Vitamin (MULTIVITAMIN WITH MINERALS) TABS tablet Take 1 tablet by mouth daily.    Marland Kitchen. omeprazole (PRILOSEC) 10 MG capsule Take 10 mg by mouth every evening.     . simvastatin (ZOCOR) 20 MG tablet Take 20 mg by mouth every evening.     . venlafaxine XR (EFFEXOR-XR) 75 MG 24 hr capsule Take 75 mg by mouth daily.    Marland Kitchen. apixaban (ELIQUIS) 2.5 MG TABS tablet Take 1 tablet (2.5 mg total) by mouth 2 (two) times daily. (Patient not taking: Reported on 11/23/2015) 24 tablet 0  . HYDROcodone-acetaminophen (NORCO) 5-325 MG tablet Take 1-2 tablets by mouth every 4 (four) hours as needed for moderate pain. (Patient not taking: Reported on 11/23/2015) 84 tablet 0  . ondansetron (ZOFRAN) 4 MG tablet Take 1 tablet (4 mg total) by mouth every 8 (eight) hours as needed for nausea or vomiting. (Patient not taking: Reported on 11/23/2015) 30 tablet 0   No current facility-administered medications for this visit.      REVIEW OF SYSTEMS:   Constitutional: Denies  fevers, chills or night sweats Eyes: Denies blurriness of vision Ears, nose, mouth, throat, and face: Denies mucositis or sore throat Respiratory: Denies cough, dyspnea or wheezes Cardiovascular: Denies palpitation, chest discomfort or lower extremity swelling Gastrointestinal:  Denies nausea, heartburn or change in bowel habits Skin: Denies abnormal skin rashes Lymphatics: Denies new lymphadenopathy or easy bruising Neurological:Denies numbness, tingling or new weaknesses Behavioral/Psych: Mood is stable, no new changes  All other systems were reviewed with the patient and are  negative.  PHYSICAL EXAMINATION: ECOG PERFORMANCE STATUS: 0 - Asymptomatic  Vitals:   11/23/15 0854  BP: (!) 176/79  Pulse: 60  Resp: 18  Temp: 97.9 F (36.6 C)   Filed Weights   11/23/15 0854  Weight: 145 lb 6.4 oz (66 kg)    GENERAL:alert, no distress and comfortable SKIN: skin color, texture, turgor are normal, no rashes or significant lesions EYES: normal, Conjunctiva are pink and non-injected, sclera clear Musculoskeletal:no cyanosis of digits and no clubbing  NEURO: alert & oriented x 3 with fluent speech, no focal motor/sensory deficits  LABORATORY DATA:  I have reviewed the data as listed     Component Value Date/Time   NA 139 10/17/2015 0540   NA 144 11/14/2013 0822   K 3.8 10/17/2015 0540   K 3.9 11/14/2013 0822   CL 108 10/17/2015 0540   CL 110 (H) 06/28/2012 0807   CO2 26 10/17/2015 0540   CO2 25 11/14/2013 0822   GLUCOSE 114 (H) 10/17/2015 0540   GLUCOSE 98 11/14/2013 0822   GLUCOSE 88 06/28/2012 0807   BUN 12 10/17/2015 0540   BUN 19.6 11/14/2013 0822   CREATININE 0.95 10/17/2015 0540   CREATININE 0.8 11/14/2013 0822   CALCIUM 8.9 10/17/2015 0540   CALCIUM 9.7 11/14/2013 0822   PROT 6.4 (L) 10/05/2015 1058   PROT 6.2 (L) 11/14/2013 0822   ALBUMIN 3.8 10/05/2015 1058   ALBUMIN 3.7 11/14/2013 0822   AST 22 10/05/2015 1058   AST 20 11/14/2013 0822   ALT 16 10/05/2015 1058   ALT 18 11/14/2013 0822   ALKPHOS 28 (L) 10/05/2015 1058   ALKPHOS 27 (L) 11/14/2013 0822   BILITOT 0.5 10/05/2015 1058   BILITOT 0.35 11/14/2013 0822   GFRNONAA 59 (L) 10/17/2015 0540   GFRAA >60 10/17/2015 0540    No results found for: SPEP, UPEP  Lab Results  Component Value Date   WBC 3.8 (L) 11/23/2015   NEUTROABS 2.0 11/23/2015   HGB 11.1 (L) 11/23/2015   HCT 34.9 11/23/2015   MCV 97.8 11/23/2015   PLT 123 (L) 11/23/2015      Chemistry      Component Value Date/Time   NA 139 10/17/2015 0540   NA 144 11/14/2013 0822   K 3.8 10/17/2015 0540   K 3.9  11/14/2013 0822   CL 108 10/17/2015 0540   CL 110 (H) 06/28/2012 0807   CO2 26 10/17/2015 0540   CO2 25 11/14/2013 0822   BUN 12 10/17/2015 0540   BUN 19.6 11/14/2013 0822   CREATININE 0.95 10/17/2015 0540   CREATININE 0.8 11/14/2013 0822      Component Value Date/Time   CALCIUM 8.9 10/17/2015 0540   CALCIUM 9.7 11/14/2013 0822   ALKPHOS 28 (L) 10/05/2015 1058   ALKPHOS 27 (L) 11/14/2013 0822   AST 22 10/05/2015 1058   AST 20 11/14/2013 0822   ALT 16 10/05/2015 1058   ALT 18 11/14/2013 0822   BILITOT 0.5 10/05/2015 1058   BILITOT 0.35 11/14/2013 [id number]  ASSESSMENT & PLAN:  Pancytopenia (HCC) She has chronic pancytopenia for long time. This is likely multifactorial from anemia of chronic disease and mild iron deficiency. Her white blood cell counts and platelet counts fluctuate up and down She had extensive evaluation in the past with no definitive cause found. She is not symptomatic and I recommend close observation.  Essential hypertension There is evidence of white coat hypertension I recommend close follow-up with primary care doctor for management.    No orders of the defined types were placed in this encounter.   All questions were answered. The patient knows to call the clinic with any problems, questions or concerns. No barriers to learning was detected.  I spent 10 minutes counseling the patient face to face. The total time spent in the appointment was 15 minutes and more than 50% was on counseling.     Artis Delay, MD 11/6/201710:33 AM

## 2015-11-23 NOTE — Assessment & Plan Note (Signed)
There is evidence of white coat hypertension I recommend close follow-up with primary care doctor for management.

## 2015-11-27 ENCOUNTER — Ambulatory Visit: Payer: Medicare Other | Admitting: Hematology and Oncology

## 2015-11-27 ENCOUNTER — Other Ambulatory Visit: Payer: Medicare Other

## 2015-12-02 ENCOUNTER — Telehealth: Payer: Self-pay | Admitting: Hematology and Oncology

## 2015-12-02 NOTE — Telephone Encounter (Signed)
Left message for patient re lab/fu nov 2018. Schedule mailed.

## 2016-06-07 ENCOUNTER — Ambulatory Visit: Payer: Self-pay | Admitting: Physician Assistant

## 2016-06-07 NOTE — H&P (Signed)
TOTAL KNEE ADMISSION H&P  Patient is being admitted for left total knee arthroplasty.  Subjective:  Chief Complaint:left knee pain.  HPI: Sandy Hodges, 71 y.o. female, has a history of pain and functional disability in the left knee due to arthritis and has failed non-surgical conservative treatments for greater than 12 weeks to includeNSAID's and/or analgesics, corticosteriod injections, viscosupplementation injections, use of assistive devices and activity modification.  Onset of symptoms was gradual, starting >10 years ago with gradually worsening course since that time. The patient noted no past surgery on the left knee(s).  Patient currently rates pain in the left knee(s) at 10 out of 10 with activity. Patient has night pain, worsening of pain with activity and weight bearing, pain that interferes with activities of daily living, pain with passive range of motion, crepitus and joint swelling.  Patient has evidence of periarticular osteophytes and joint space narrowing by imaging studies.There is no active infection.  Patient Active Problem List   Diagnosis Date Noted  . Primary localized osteoarthritis of right knee 10/16/2015  . Depression 10/16/2014  . Arthritis 11/14/2013  . Essential hypertension 11/14/2013  . Preventive measure 11/14/2013  . Uterine mass 08/01/2013  . Deficiency anemia 07/09/2013  . Fatigue 07/09/2013  . Knee pain 07/09/2013  . Back pain 07/09/2013  . Abdominal pain, unspecified site 07/09/2013  . Leukopenia 07/09/2013  . Flank pain 07/09/2013  . Pancytopenia (HCC) 12/14/2011   Past Medical History:  Diagnosis Date  . Abdominal pain, unspecified site 07/09/2013  . Arthritis 11/14/2013  . Back pain 07/09/2013  . Depression 10/16/2014  . Essential hypertension 11/14/2013  . Fatigue 07/09/2013  . Fibromyalgia   . Hyperlipidemia   . Hypertension   . Knee pain 07/09/2013  . OA (osteoarthritis) of knee   . Pancytopenia (HCC) 12/14/2011  . Pancytopenia  (HCC)   . PONV (postoperative nausea and vomiting)   . Unspecified deficiency anemia 07/09/2013    Past Surgical History:  Procedure Laterality Date  . COLONOSCOPY    . ROTATOR CUFF REPAIR Right 09/2014  . TOTAL KNEE ARTHROPLASTY Right 10/16/2015   Procedure: TOTAL KNEE ARTHROPLASTY;  Surgeon: Frederico Hamman, MD;  Location: [facility] OR;  Service: Orthopedics;  Laterality: Right;     (Not in a hospital admission) No Known Allergies  Social History  Substance Use Topics  . Smoking status: Never Smoker  . Smokeless tobacco: Never Used  . Alcohol use No    Family History  Problem Relation Age of Onset  . Cancer Mother        breast ca  . Hypertension Father   . CVA Father      Review of Systems  Constitutional: Positive for weight loss.  Musculoskeletal: Positive for joint pain.  All other systems reviewed and are negative.   Objective:  Physical Exam  Constitutional: She is oriented to person, place, and time. She appears well-developed and well-nourished. No distress.  HENT:  Head: Normocephalic and atraumatic.  Nose: Nose normal.  Eyes: Conjunctivae and EOM are normal. Pupils are equal, round, and reactive to light.  Neck: Normal range of motion. Neck supple.  Cardiovascular: Normal rate, regular rhythm, normal heart sounds and intact distal pulses.   Respiratory: Effort normal and breath sounds normal. No respiratory distress. She has no wheezes.  GI: Soft. Bowel sounds are normal. She exhibits no distension. There is no tenderness.  Musculoskeletal:       Left knee: She exhibits decreased range of motion and swelling. Tenderness found.  Lymphadenopathy:  She has no cervical adenopathy.  Neurological: She is alert and oriented to person, place, and time. No cranial nerve deficit.  Skin: Skin is warm and dry. No rash noted. No erythema.  Psychiatric: She has a normal mood and affect. Her behavior is normal.    Vital signs in last 24  hours: @VSRANGES @  Labs:   Estimated body mass index is 24.2 kg/m as calculated from the following:   Height as of 11/23/15: 5\' 5"  (1.651 m).   Weight as of 11/23/15: 66 kg (145 lb 6.4 oz).   Imaging Review Plain radiographs demonstrate moderate degenerative joint disease of the left knee(s). The overall alignment ismild varus. The bone quality appears to be good for age and reported activity level.  Assessment/Plan:  End stage arthritis, left knee   The patient history, physical examination, clinical judgment of the provider and imaging studies are consistent with end stage degenerative joint disease of the left knee(s) and total knee arthroplasty is deemed medically necessary. The treatment options including medical management, injection therapy arthroscopy and arthroplasty were discussed at length. The risks and benefits of total knee arthroplasty were presented and reviewed. The risks due to aseptic loosening, infection, stiffness, patella tracking problems, thromboembolic complications and other imponderables were discussed. The patient acknowledged the explanation, agreed to proceed with the plan and consent was signed. Patient is being admitted for inpatient treatment for surgery, pain control, PT, OT, prophylactic antibiotics, VTE prophylaxis, progressive ambulation and ADL's and discharge planning. The patient is planning to be discharged home with home health services

## 2016-06-14 ENCOUNTER — Encounter (HOSPITAL_COMMUNITY): Payer: Self-pay

## 2016-06-14 ENCOUNTER — Encounter (HOSPITAL_COMMUNITY)
Admission: RE | Admit: 2016-06-14 | Discharge: 2016-06-14 | Disposition: A | Discharge status: Home / Self Care | Payer: Medicare Other | Source: Ambulatory Visit | Attending: Orthopedic Surgery | Admitting: Orthopedic Surgery

## 2016-06-14 DIAGNOSIS — Z01812 Encounter for preprocedural laboratory examination: Secondary | ICD-10-CM | POA: Diagnosis not present

## 2016-06-14 DIAGNOSIS — M1712 Unilateral primary osteoarthritis, left knee: Secondary | ICD-10-CM | POA: Diagnosis not present

## 2016-06-14 HISTORY — DX: Anxiety disorder, unspecified: F41.9

## 2016-06-14 HISTORY — DX: Disease of blood and blood-forming organs, unspecified: D75.9

## 2016-06-14 LAB — URINALYSIS, ROUTINE W REFLEX MICROSCOPIC
Bilirubin Urine: NEGATIVE
Glucose, UA: NEGATIVE mg/dL
Hgb urine dipstick: NEGATIVE
Ketones, ur: NEGATIVE mg/dL
LEUKOCYTES UA: NEGATIVE
Nitrite: NEGATIVE
PROTEIN: NEGATIVE mg/dL
Specific Gravity, Urine: 1.016 (ref 1.005–1.030)
pH: 5 (ref 5.0–8.0)

## 2016-06-14 LAB — TYPE AND SCREEN
ABO/RH(D): A POS
Antibody Screen: NEGATIVE

## 2016-06-14 LAB — SURGICAL PCR SCREEN
MRSA, PCR: NEGATIVE
STAPHYLOCOCCUS AUREUS: NEGATIVE

## 2016-06-14 LAB — CBC WITH DIFFERENTIAL/PLATELET
Basophils Absolute: 0 10*3/uL (ref 0.0–0.1)
Basophils Relative: 1 %
Eosinophils Absolute: 0.2 10*3/uL (ref 0.0–0.7)
Eosinophils Relative: 8 %
HEMATOCRIT: 35.3 % — AB (ref 36.0–46.0)
HEMOGLOBIN: 11.1 g/dL — AB (ref 12.0–15.0)
LYMPHS ABS: 0.6 10*3/uL — AB (ref 0.7–4.0)
Lymphocytes Relative: 22 %
MCH: 30 pg (ref 26.0–34.0)
MCHC: 31.4 g/dL (ref 30.0–36.0)
MCV: 95.4 fL (ref 78.0–100.0)
Monocytes Absolute: 0.2 10*3/uL (ref 0.1–1.0)
Monocytes Relative: 6 %
NEUTROS ABS: 1.6 10*3/uL — AB (ref 1.7–7.7)
NEUTROS PCT: 63 %
Platelets: 133 10*3/uL — ABNORMAL LOW (ref 150–400)
RBC: 3.7 MIL/uL — ABNORMAL LOW (ref 3.87–5.11)
RDW: 12.7 % (ref 11.5–15.5)
WBC: 2.5 10*3/uL — ABNORMAL LOW (ref 4.0–10.5)

## 2016-06-14 LAB — COMPREHENSIVE METABOLIC PANEL
ALK PHOS: 28 U/L — AB (ref 38–126)
ALT: 18 U/L (ref 14–54)
ANION GAP: 8 (ref 5–15)
AST: 24 U/L (ref 15–41)
Albumin: 3.9 g/dL (ref 3.5–5.0)
BUN: 33 mg/dL — ABNORMAL HIGH (ref 6–20)
CALCIUM: 10.5 mg/dL — AB (ref 8.9–10.3)
CO2: 27 mmol/L (ref 22–32)
Chloride: 106 mmol/L (ref 101–111)
Creatinine, Ser: 1.16 mg/dL — ABNORMAL HIGH (ref 0.44–1.00)
GFR calc non Af Amer: 46 mL/min — ABNORMAL LOW (ref >60)
GFR, EST AFRICAN AMERICAN: 53 mL/min — AB (ref >60)
GLUCOSE: 88 mg/dL (ref 65–99)
Potassium: 3.8 mmol/L (ref 3.5–5.1)
SODIUM: 141 mmol/L (ref 135–145)
Total Bilirubin: 0.5 mg/dL (ref 0.3–1.2)
Total Protein: 6.6 g/dL (ref 6.5–8.1)

## 2016-06-14 LAB — PROTIME-INR
INR: 0.97
Prothrombin Time: 12.9 seconds (ref 11.4–15.2)

## 2016-06-14 LAB — APTT: aPTT: 29 seconds (ref 24–36)

## 2016-06-14 NOTE — Progress Notes (Signed)
[facility], Ph# [phone].  She is [facility] those [facility] from [facility].

## 2016-06-14 NOTE — Progress Notes (Signed)
Left voicemail requesting recent EKG 2 weeks ago from Dr[facility] Paulino [facility]Wolters at [facility] [place], Ph# [phone]

## 2016-06-14 NOTE — Pre-Procedure Instructions (Signed)
Sandy Sandy Hodges R Sandy Hodges  06/14/2016      [facility] - [place], [place] - [address] [place] [id number] Phone: [phone] Fax: [phone]    Your procedure is scheduled on Friday, June 24, 2016.  [facility].M.  Call this number if you have problems the morning of surgery:  [phone]   Remember:  Do not eat food or drink liquids after midnight.  Take these medicines the morning of surgery with A SIP OF WATER Tylenol (if needed), loratadine (Claritin); omeprazole (prilosec OTC) if needed   7 days prior to surgery STOP taking any Aspirin, Aleve, Naproxen, Ibuprofen, Motrin, Advil, Goody's, BC's, all herbal medications, fish oil, all vitamins, probiotic, and diclofenac (Voltaren)   Do not wear jewelry, make-up or nail polish.  Do not wear lotions, powders, or perfumes, or deoderant.  Do not shave 48 hours prior to surgery.  Men may shave face and neck.  Do not bring valuables to the hospital.  [facility] is not responsible for any belongings or valuables.  Contacts, dentures or bridgework may not be worn into surgery.  Leave your suitcase in the car.  After surgery it may be brought to your room.  For patients admitted to the hospital, discharge time will be determined by your treatment team.  Patients discharged the day of surgery will not be allowed to drive home.   Name and phone number of your driver:    Special instructions:     [facility]- Preparing For Surgery  Before surgery, you can play an important role. Because skin is not sterile, your skin needs to be as free of germs as possible. You can reduce the number of germs on your skin by washing with CHG (chlorahexidine gluconate) Soap before surgery.  CHG is an antiseptic cleaner which kills germs and bonds with the skin to continue killing germs even after washing.  Please do not use if you have an allergy to CHG  or antibacterial soaps. If your skin becomes reddened/irritated stop using the CHG.  Do not shave (including legs and underarms) for at least 48 hours prior to first CHG shower. It is OK to shave your face.  Please follow these instructions carefully.   1. Shower the NIGHT BEFORE SURGERY and the MORNING OF SURGERY with CHG.   2. If you chose to wash your hair, wash your hair first as usual with your normal shampoo.  3. After you shampoo, rinse your hair and body thoroughly to remove the shampoo.  4. Use CHG as you would any other liquid soap. You can apply CHG directly to the skin and wash gently with a scrungie or a clean washcloth.   5. Apply the CHG Soap to your body ONLY FROM THE NECK DOWN.  Do not use on open wounds or open sores. Avoid contact with your eyes, ears, mouth and genitals (private parts). Wash genitals (private parts) with your normal soap.  6. Wash thoroughly, paying special attention to the area where your surgery will be performed.  7. Thoroughly rinse your body with warm water from the neck down.  8. DO NOT shower/wash with your normal soap after using and rinsing off the CHG Soap.  9. Pat yourself dry with a CLEAN TOWEL.   10. Wear CLEAN PAJAMAS   11. Place CLEAN SHEETS on your bed the night of your first shower and DO NOT SLEEP WITH PETS.  Day of Surgery: Do not apply any deodorants/lotions. Please wear clean clothes to the hospital/surgery center.      Please read over the following fact sheets that you were given. Pain Booklet, Total Joint Packet, MRSA Information and Surgical Site Infection Prevention

## 2016-06-15 LAB — URINE CULTURE

## 2016-06-15 NOTE — Progress Notes (Addendum)
Anesthesia Chart Review:   Patient is a 71 year old female scheduled for LTKA on 06/24/2016 with Frederico Hammananiel Caffrey, MD.  - PCP is Mila PalmerSharon Wolters, MD who is aware of upcoming surgery.. - Hematologist is Artis DelayNi Gorsuch, MD, last visit 11/23/15. Continued observation of her chronic pancytopenia and anemia recommended, f/u in 1 year.   - Not currently followed by a cardiologist, but saw Dr. Eldridge DaceVaranasi in 2011 for palpitations. Last office visit with Dr. Eldridge DaceVaranasi was on 06/12/12 for bradycardia follow-up. Diltiazem held, but otherwise when necessary cardiology follow-up recommended.  History includes: nonsmoker, pancytopenia, anemia, hyperlipidemia, hypertension, fibromyalgia, depression, arthritis. BMI 25. S/p R TKA 10/16/15  Meds include Xanax, atenolol, TriCor, 65 FE, fish oil omega 3 fatty acids, lisinopril-HCTZ, Claritin, Prilosec, Zocor  BP 126/62   Pulse 65   Temp 36.6 C (Oral)   Ht 5\' 5"  (1.651 m)   Wt 150 lb 3.2 oz (68.1 kg)   SpO2 100%   BMI 24.99 kg/m    Preoperative labs reviewed.  WBC 2.5. H/H 11.1/35.3. Platelets 133.   CXR 10/05/15:  1.  Small left pleural effusion. 2. Cardiomegaly, no pulmonary venous congestion.  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  08/24/09-08/25/09 Holter monitor: Predominantly normal sinus rhythm. Symptoms corresponded to PACs and rare junctional escape beats.  04/17/09 Echo:  1. Left ventricular ejection fraction 60-65%. 2. Trace aortic valve regurgitation. 3. Aortic valve is sclerotic but opens well.  If no changes, I anticipate pt can proceed with surgery as scheduled.   Rica Mastngela Levonte Molina, FNP-BC [facility] Phone: [phone] 06/20/2016 4:13 PM

## 2016-06-23 MED ORDER — TRANEXAMIC ACID 1000 MG/10ML IV SOLN
1000.0000 mg | INTRAVENOUS | Status: AC
  Administered 2016-06-24: 1000 mg via INTRAVENOUS
  Filled 2016-06-23: qty 10

## 2016-06-23 MED ORDER — CEFAZOLIN SODIUM-DEXTROSE 2-4 GM/100ML-% IV SOLN
2.0000 g | INTRAVENOUS | Status: AC
  Administered 2016-06-24: 2 g via INTRAVENOUS
  Filled 2016-06-23: qty 100

## 2016-06-23 MED ORDER — SODIUM CHLORIDE 0.9 % IV SOLN: INTRAVENOUS | Status: DC

## 2016-06-24 ENCOUNTER — Inpatient Hospital Stay (HOSPITAL_COMMUNITY): Payer: Medicare Other | Admitting: Emergency Medicine

## 2016-06-24 ENCOUNTER — Encounter (HOSPITAL_COMMUNITY)
Admission: RE | Disposition: A | Discharge status: Home Health | Payer: Self-pay | Source: Ambulatory Visit | Attending: Orthopedic Surgery

## 2016-06-24 ENCOUNTER — Encounter (HOSPITAL_COMMUNITY): Payer: Self-pay | Admitting: Surgery

## 2016-06-24 ENCOUNTER — Inpatient Hospital Stay (HOSPITAL_COMMUNITY): Payer: Medicare Other | Admitting: Certified Registered Nurse Anesthetist

## 2016-06-24 ENCOUNTER — Inpatient Hospital Stay (HOSPITAL_COMMUNITY)
Admission: RE | Admit: 2016-06-24 | Discharge: 2016-06-25 | DRG: 470 | Disposition: A | Discharge status: Home Health | Payer: Medicare Other | Source: Ambulatory Visit | Attending: Orthopedic Surgery | Admitting: Orthopedic Surgery

## 2016-06-24 DIAGNOSIS — Z823 Family history of stroke: Secondary | ICD-10-CM | POA: Diagnosis not present

## 2016-06-24 DIAGNOSIS — I10 Essential (primary) hypertension: Secondary | ICD-10-CM | POA: Diagnosis present

## 2016-06-24 DIAGNOSIS — M25562 Pain in left knee: Secondary | ICD-10-CM | POA: Diagnosis present

## 2016-06-24 DIAGNOSIS — Z8249 Family history of ischemic heart disease and other diseases of the circulatory system: Secondary | ICD-10-CM

## 2016-06-24 DIAGNOSIS — M1712 Unilateral primary osteoarthritis, left knee: Principal | ICD-10-CM | POA: Diagnosis present

## 2016-06-24 DIAGNOSIS — M797 Fibromyalgia: Secondary | ICD-10-CM | POA: Diagnosis not present

## 2016-06-24 DIAGNOSIS — Z96651 Presence of right artificial knee joint: Secondary | ICD-10-CM | POA: Diagnosis present

## 2016-06-24 DIAGNOSIS — E785 Hyperlipidemia, unspecified: Secondary | ICD-10-CM | POA: Diagnosis not present

## 2016-06-24 HISTORY — PX: TOTAL KNEE ARTHROPLASTY: SHX125

## 2016-06-24 SURGERY — ARTHROPLASTY, KNEE, TOTAL
Anesthesia: Monitor Anesthesia Care | Laterality: Left

## 2016-06-24 MED ORDER — LISINOPRIL-HYDROCHLOROTHIAZIDE 20-12.5 MG PO TABS: 1.0000 | ORAL_TABLET | Freq: Every evening | ORAL | Status: DC

## 2016-06-24 MED ORDER — ATENOLOL 50 MG PO TABS
50.0000 mg | ORAL_TABLET | Freq: Every evening | ORAL | Status: DC
  Administered 2016-06-24: 50 mg via ORAL
  Filled 2016-06-24: qty 1

## 2016-06-24 MED ORDER — ACETAMINOPHEN 325 MG PO TABS
650.0000 mg | ORAL_TABLET | Freq: Four times a day (QID) | ORAL | Status: DC | PRN
  Administered 2016-06-24: 650 mg via ORAL
  Filled 2016-06-24: qty 2

## 2016-06-24 MED ORDER — APIXABAN 2.5 MG PO TABS: 2.5000 mg | ORAL_TABLET | Freq: Two times a day (BID) | ORAL | 0 refills | Status: DC

## 2016-06-24 MED ORDER — HYDROCHLOROTHIAZIDE 12.5 MG PO CAPS
12.5000 mg | ORAL_CAPSULE | Freq: Every evening | ORAL | Status: DC
  Administered 2016-06-24: 12.5 mg via ORAL
  Filled 2016-06-24: qty 1

## 2016-06-24 MED ORDER — EPHEDRINE SULFATE 50 MG/ML IJ SOLN
INTRAMUSCULAR | Status: DC | PRN
  Administered 2016-06-24: 5 mg via INTRAVENOUS

## 2016-06-24 MED ORDER — PROPOFOL 10 MG/ML IV BOLUS
INTRAVENOUS | Status: DC | PRN
  Administered 2016-06-24: 30 mg via INTRAVENOUS
  Administered 2016-06-24: 10 mg via INTRAVENOUS
  Administered 2016-06-24 (×4): 20 mg via INTRAVENOUS

## 2016-06-24 MED ORDER — OXYCODONE HCL 5 MG PO TABS: 5.0000 mg | ORAL_TABLET | Freq: Once | ORAL | Status: DC | PRN

## 2016-06-24 MED ORDER — METOCLOPRAMIDE HCL 5 MG/ML IJ SOLN: 5.0000 mg | Freq: Three times a day (TID) | INTRAMUSCULAR | Status: DC | PRN

## 2016-06-24 MED ORDER — FENOFIBRATE 160 MG PO TABS
160.0000 mg | ORAL_TABLET | Freq: Every day | ORAL | Status: DC
  Administered 2016-06-24: 160 mg via ORAL
  Filled 2016-06-24: qty 1

## 2016-06-24 MED ORDER — SIMVASTATIN 20 MG PO TABS
20.0000 mg | ORAL_TABLET | Freq: Every evening | ORAL | Status: DC
  Administered 2016-06-24: 20 mg via ORAL
  Filled 2016-06-24: qty 1

## 2016-06-24 MED ORDER — KETAMINE HCL 10 MG/ML IJ SOLN
INTRAMUSCULAR | Status: DC | PRN
  Administered 2016-06-24: 20 mg via INTRAVENOUS
  Administered 2016-06-24: 10 mg via INTRAVENOUS

## 2016-06-24 MED ORDER — SODIUM CHLORIDE 0.9 % IR SOLN
Status: DC | PRN
  Administered 2016-06-24: 3000 mL

## 2016-06-24 MED ORDER — OXYCODONE-ACETAMINOPHEN 5-325 MG PO TABS: 1.0000 | ORAL_TABLET | ORAL | 0 refills | Status: DC | PRN

## 2016-06-24 MED ORDER — BISACODYL 5 MG PO TBEC: 5.0000 mg | DELAYED_RELEASE_TABLET | Freq: Every day | ORAL | Status: DC | PRN

## 2016-06-24 MED ORDER — HYDROMORPHONE HCL 1 MG/ML IJ SOLN: 0.2500 mg | INTRAMUSCULAR | Status: DC | PRN

## 2016-06-24 MED ORDER — FLORA-Q PO CAPS
1.0000 | ORAL_CAPSULE | Freq: Every evening | ORAL | Status: DC
  Administered 2016-06-24: 1 via ORAL
  Filled 2016-06-24: qty 1

## 2016-06-24 MED ORDER — APIXABAN 2.5 MG PO TABS
2.5000 mg | ORAL_TABLET | Freq: Two times a day (BID) | ORAL | Status: DC
  Administered 2016-06-25: 2.5 mg via ORAL
  Filled 2016-06-24: qty 1

## 2016-06-24 MED ORDER — OXYCODONE HCL 5 MG/5ML PO SOLN: 5.0000 mg | Freq: Once | ORAL | Status: DC | PRN

## 2016-06-24 MED ORDER — SODIUM CHLORIDE 0.9% FLUSH
INTRAVENOUS | Status: DC | PRN
  Administered 2016-06-24: 50 mL via INTRAVENOUS

## 2016-06-24 MED ORDER — ONDANSETRON HCL 4 MG/2ML IJ SOLN
INTRAMUSCULAR | Status: DC | PRN
  Administered 2016-06-24: 4 mg via INTRAVENOUS

## 2016-06-24 MED ORDER — BUPIVACAINE-EPINEPHRINE (PF) 0.25% -1:200000 IJ SOLN
INTRAMUSCULAR | Status: DC | PRN
  Administered 2016-06-24: 50 mL

## 2016-06-24 MED ORDER — MIDAZOLAM HCL 2 MG/2ML IJ SOLN
INTRAMUSCULAR | Status: AC
  Filled 2016-06-24: qty 2

## 2016-06-24 MED ORDER — LIDOCAINE 2% (20 MG/ML) 5 ML SYRINGE
INTRAMUSCULAR | Status: AC
  Filled 2016-06-24: qty 5

## 2016-06-24 MED ORDER — CHLORHEXIDINE GLUCONATE 4 % EX LIQD: 60.0000 mL | Freq: Once | CUTANEOUS | Status: DC

## 2016-06-24 MED ORDER — CEFAZOLIN SODIUM-DEXTROSE 1-4 GM/50ML-% IV SOLN
1.0000 g | Freq: Three times a day (TID) | INTRAVENOUS | Status: AC
  Administered 2016-06-24 – 2016-06-25 (×2): 1 g via INTRAVENOUS
  Filled 2016-06-24 (×2): qty 50

## 2016-06-24 MED ORDER — BUPIVACAINE IN DEXTROSE 0.75-8.25 % IT SOLN
INTRATHECAL | Status: DC | PRN
  Administered 2016-06-24: 1.8 mL via INTRATHECAL

## 2016-06-24 MED ORDER — PROPOFOL 10 MG/ML IV BOLUS
INTRAVENOUS | Status: AC
  Filled 2016-06-24: qty 20

## 2016-06-24 MED ORDER — ACETAMINOPHEN 500 MG PO TABS: 1000.0000 mg | ORAL_TABLET | Freq: Two times a day (BID) | ORAL | Status: DC | PRN

## 2016-06-24 MED ORDER — ONDANSETRON HCL 4 MG PO TABS
4.0000 mg | ORAL_TABLET | Freq: Four times a day (QID) | ORAL | Status: DC
  Administered 2016-06-24 – 2016-06-25 (×3): 4 mg via ORAL
  Filled 2016-06-24 (×3): qty 1

## 2016-06-24 MED ORDER — ALPRAZOLAM 0.5 MG PO TABS
1.0000 mg | ORAL_TABLET | Freq: Every day | ORAL | Status: DC
  Administered 2016-06-24: 1 mg via ORAL
  Filled 2016-06-24: qty 2

## 2016-06-24 MED ORDER — EPHEDRINE 5 MG/ML INJ
INTRAVENOUS | Status: AC
  Filled 2016-06-24: qty 10

## 2016-06-24 MED ORDER — OXYCODONE HCL 5 MG/5ML PO SOLN: 5.0000 mg | Freq: Once | ORAL | Status: AC | PRN

## 2016-06-24 MED ORDER — PROPOFOL 500 MG/50ML IV EMUL
INTRAVENOUS | Status: AC
  Filled 2016-06-24: qty 50

## 2016-06-24 MED ORDER — ALPRAZOLAM 0.5 MG PO TABS: 0.5000 mg | ORAL_TABLET | Freq: Two times a day (BID) | ORAL | Status: DC

## 2016-06-24 MED ORDER — OXYCODONE HCL 5 MG PO TABS
5.0000 mg | ORAL_TABLET | Freq: Once | ORAL | Status: AC | PRN
  Administered 2016-06-24: 5 mg via ORAL

## 2016-06-24 MED ORDER — FLEET ENEMA 7-19 GM/118ML RE ENEM: 1.0000 | ENEMA | Freq: Once | RECTAL | Status: DC | PRN

## 2016-06-24 MED ORDER — TRANEXAMIC ACID 1000 MG/10ML IV SOLN
1000.0000 mg | Freq: Once | INTRAVENOUS | Status: AC
  Administered 2016-06-24: 1000 mg via INTRAVENOUS
  Filled 2016-06-24: qty 10

## 2016-06-24 MED ORDER — MIDAZOLAM HCL 2 MG/2ML IJ SOLN
INTRAMUSCULAR | Status: DC | PRN
  Administered 2016-06-24 (×2): 1 mg via INTRAVENOUS

## 2016-06-24 MED ORDER — DIPHENHYDRAMINE HCL 12.5 MG/5ML PO ELIX: 12.5000 mg | ORAL_SOLUTION | ORAL | Status: DC | PRN

## 2016-06-24 MED ORDER — SODIUM CHLORIDE 0.9 % IV SOLN
2000.0000 mg | INTRAVENOUS | Status: AC
  Administered 2016-06-24: 2000 mg via TOPICAL
  Filled 2016-06-24: qty 20

## 2016-06-24 MED ORDER — DEXAMETHASONE SODIUM PHOSPHATE 10 MG/ML IJ SOLN
INTRAMUSCULAR | Status: AC
  Filled 2016-06-24: qty 1

## 2016-06-24 MED ORDER — ONDANSETRON HCL 4 MG PO TABS: 4.0000 mg | ORAL_TABLET | Freq: Three times a day (TID) | ORAL | 0 refills | Status: DC | PRN

## 2016-06-24 MED ORDER — OXYCODONE HCL 5 MG PO TABS
ORAL_TABLET | ORAL | Status: AC
  Filled 2016-06-24: qty 1

## 2016-06-24 MED ORDER — ONDANSETRON HCL 4 MG/2ML IJ SOLN
INTRAMUSCULAR | Status: AC
  Filled 2016-06-24: qty 2

## 2016-06-24 MED ORDER — DOCUSATE SODIUM 100 MG PO CAPS
100.0000 mg | ORAL_CAPSULE | Freq: Two times a day (BID) | ORAL | Status: DC
  Administered 2016-06-24 – 2016-06-25 (×3): 100 mg via ORAL
  Filled 2016-06-24 (×3): qty 1

## 2016-06-24 MED ORDER — FENTANYL CITRATE (PF) 250 MCG/5ML IJ SOLN
INTRAMUSCULAR | Status: AC
  Filled 2016-06-24: qty 5

## 2016-06-24 MED ORDER — METOCLOPRAMIDE HCL 5 MG PO TABS: 5.0000 mg | ORAL_TABLET | Freq: Three times a day (TID) | ORAL | Status: DC | PRN

## 2016-06-24 MED ORDER — SENNOSIDES-DOCUSATE SODIUM 8.6-50 MG PO TABS: 1.0000 | ORAL_TABLET | Freq: Every evening | ORAL | Status: DC | PRN

## 2016-06-24 MED ORDER — PHENOL 1.4 % MT LIQD: 1.0000 | OROMUCOSAL | Status: DC | PRN

## 2016-06-24 MED ORDER — ADULT MULTIVITAMIN W/MINERALS CH
1.0000 | ORAL_TABLET | Freq: Every evening | ORAL | Status: DC
  Administered 2016-06-24: 1 via ORAL
  Filled 2016-06-24: qty 1

## 2016-06-24 MED ORDER — OXYCODONE HCL 5 MG PO TABS
5.0000 mg | ORAL_TABLET | ORAL | Status: DC | PRN
  Administered 2016-06-25 (×2): 10 mg via ORAL
  Filled 2016-06-24 (×2): qty 2
  Filled 2016-06-24: qty 1

## 2016-06-24 MED ORDER — ONDANSETRON HCL 4 MG/2ML IJ SOLN
4.0000 mg | Freq: Four times a day (QID) | INTRAMUSCULAR | Status: DC
  Administered 2016-06-25: 4 mg via INTRAVENOUS
  Filled 2016-06-24: qty 2

## 2016-06-24 MED ORDER — PROPOFOL 500 MG/50ML IV EMUL
INTRAVENOUS | Status: DC | PRN
  Administered 2016-06-24: 25 ug/kg/min via INTRAVENOUS

## 2016-06-24 MED ORDER — BUPIVACAINE-EPINEPHRINE (PF) 0.25% -1:200000 IJ SOLN
INTRAMUSCULAR | Status: AC
  Filled 2016-06-24: qty 60

## 2016-06-24 MED ORDER — VITAMIN D 1000 UNITS PO TABS
1000.0000 [IU] | ORAL_TABLET | Freq: Every day | ORAL | Status: DC
  Administered 2016-06-24: 1000 [IU] via ORAL
  Filled 2016-06-24 (×3): qty 1

## 2016-06-24 MED ORDER — LORATADINE 10 MG PO TABS
10.0000 mg | ORAL_TABLET | Freq: Two times a day (BID) | ORAL | Status: DC
  Administered 2016-06-24 – 2016-06-25 (×3): 10 mg via ORAL
  Filled 2016-06-24 (×3): qty 1

## 2016-06-24 MED ORDER — KETAMINE HCL-SODIUM CHLORIDE 100-0.9 MG/10ML-% IV SOSY
PREFILLED_SYRINGE | INTRAVENOUS | Status: AC
  Filled 2016-06-24: qty 10

## 2016-06-24 MED ORDER — LACTATED RINGERS IV SOLN
INTRAVENOUS | Status: DC | PRN
  Administered 2016-06-24 (×3): via INTRAVENOUS

## 2016-06-24 MED ORDER — LISINOPRIL 20 MG PO TABS
20.0000 mg | ORAL_TABLET | Freq: Every evening | ORAL | Status: DC
  Administered 2016-06-24: 20 mg via ORAL
  Filled 2016-06-24: qty 1

## 2016-06-24 MED ORDER — GLYCOPYRROLATE 0.2 MG/ML IJ SOLN
INTRAMUSCULAR | Status: DC | PRN
  Administered 2016-06-24: 0.2 mg via INTRAVENOUS

## 2016-06-24 MED ORDER — BUPIVACAINE LIPOSOME 1.3 % IJ SUSP
20.0000 mL | INTRAMUSCULAR | Status: AC
  Administered 2016-06-24: 20 mL
  Filled 2016-06-24: qty 20

## 2016-06-24 MED ORDER — PHENYLEPHRINE HCL 10 MG/ML IJ SOLN
INTRAVENOUS | Status: DC | PRN
  Administered 2016-06-24: 25 ug/min via INTRAVENOUS

## 2016-06-24 MED ORDER — DEXAMETHASONE SODIUM PHOSPHATE 10 MG/ML IJ SOLN
INTRAMUSCULAR | Status: DC | PRN
  Administered 2016-06-24: 10 mg via INTRAVENOUS

## 2016-06-24 MED ORDER — SODIUM CHLORIDE 0.9 % IV SOLN
INTRAVENOUS | Status: DC
  Administered 2016-06-24: 15:00:00 via INTRAVENOUS

## 2016-06-24 MED ORDER — BUPIVACAINE-EPINEPHRINE (PF) 0.5% -1:200000 IJ SOLN
INTRAMUSCULAR | Status: DC | PRN
  Administered 2016-06-24: 15 mL via PERINEURAL

## 2016-06-24 MED ORDER — OMEPRAZOLE 20 MG PO CPDR
20.0000 mg | DELAYED_RELEASE_CAPSULE | Freq: Every day | ORAL | Status: DC | PRN
  Filled 2016-06-24: qty 1

## 2016-06-24 MED ORDER — HYDROMORPHONE HCL 1 MG/ML IJ SOLN: 1.0000 mg | INTRAMUSCULAR | Status: DC | PRN

## 2016-06-24 MED ORDER — FENTANYL CITRATE (PF) 250 MCG/5ML IJ SOLN
INTRAMUSCULAR | Status: DC | PRN
  Administered 2016-06-24: 25 ug via INTRAVENOUS
  Administered 2016-06-24 (×2): 50 ug via INTRAVENOUS
  Administered 2016-06-24: 25 ug via INTRAVENOUS

## 2016-06-24 MED ORDER — FERROUS SULFATE 325 (65 FE) MG PO TABS
325.0000 mg | ORAL_TABLET | ORAL | Status: DC
  Administered 2016-06-24: 325 mg via ORAL
  Filled 2016-06-24: qty 1

## 2016-06-24 MED ORDER — ALPRAZOLAM 0.5 MG PO TABS: 0.5000 mg | ORAL_TABLET | Freq: Every day | ORAL | Status: DC

## 2016-06-24 MED ORDER — MENTHOL 3 MG MT LOZG
1.0000 | LOZENGE | OROMUCOSAL | Status: DC | PRN
  Filled 2016-06-24: qty 9

## 2016-06-24 MED ORDER — ACETAMINOPHEN 650 MG RE SUPP: 650.0000 mg | Freq: Four times a day (QID) | RECTAL | Status: DC | PRN

## 2016-06-24 MED ORDER — ARTIFICIAL TEARS OPHTHALMIC OINT
TOPICAL_OINTMENT | OPHTHALMIC | Status: AC
  Filled 2016-06-24: qty 3.5

## 2016-06-24 MED ORDER — CEFAZOLIN SODIUM-DEXTROSE 1-4 GM/50ML-% IV SOLN
1.0000 g | Freq: Four times a day (QID) | INTRAVENOUS | Status: DC
  Filled 2016-06-24 (×2): qty 50

## 2016-06-24 MED ORDER — LIDOCAINE HCL (CARDIAC) 20 MG/ML IV SOLN
INTRAVENOUS | Status: DC | PRN
  Administered 2016-06-24: 60 mg via INTRATRACHEAL

## 2016-06-24 SURGICAL SUPPLY — 71 items
BAG DECANTER FOR FLEXI CONT (MISCELLANEOUS) ×2 IMPLANT
BANDAGE ACE 4X5 VEL STRL LF (GAUZE/BANDAGES/DRESSINGS) ×3 IMPLANT
BANDAGE ACE 6X5 VEL STRL LF (GAUZE/BANDAGES/DRESSINGS) ×3 IMPLANT
BANDAGE ESMARK 6X9 LF (GAUZE/BANDAGES/DRESSINGS) ×1 IMPLANT
BLADE 10 SAFETY STRL DISP (BLADE) ×2 IMPLANT
BLADE SAGITTAL 25.0X1.19X90 (BLADE) ×2 IMPLANT
BLADE SAGITTAL 25.0X1.19X90MM (BLADE) ×1
BLADE SAW SAG 90X13X1.27 (BLADE) ×3 IMPLANT
BNDG CMPR 9X6 STRL LF SNTH (GAUZE/BANDAGES/DRESSINGS) ×1
BNDG ESMARK 6X9 LF (GAUZE/BANDAGES/DRESSINGS) ×3
BOWL SMART MIX CTS (DISPOSABLE) ×3 IMPLANT
CAP KNEE TOTAL 3 SIGMA ×2 IMPLANT
CEMENT HV SMART SET (Cement) ×4 IMPLANT
COVER SURGICAL LIGHT HANDLE (MISCELLANEOUS) ×3 IMPLANT
CUFF TOURNIQUET SINGLE 34IN LL (TOURNIQUET CUFF) ×3 IMPLANT
CUFF TOURNIQUET SINGLE 44IN (TOURNIQUET CUFF) IMPLANT
DRAPE INCISE IOBAN 66X45 STRL (DRAPES) IMPLANT
DRAPE ORTHO SPLIT 77X108 STRL (DRAPES) ×6
DRAPE SURG ORHT 6 SPLT 77X108 (DRAPES) ×2 IMPLANT
DRAPE U-SHAPE 47X51 STRL (DRAPES) ×3 IMPLANT
DRSG ADAPTIC 3X8 NADH LF (GAUZE/BANDAGES/DRESSINGS) ×3 IMPLANT
DRSG PAD ABDOMINAL 8X10 ST (GAUZE/BANDAGES/DRESSINGS) ×6 IMPLANT
DURAPREP 26ML APPLICATOR (WOUND CARE) ×3 IMPLANT
ELECT REM PT RETURN 9FT ADLT (ELECTROSURGICAL) ×3
ELECTRODE REM PT RTRN 9FT ADLT (ELECTROSURGICAL) ×1 IMPLANT
EVACUATOR 1/8 PVC DRAIN (DRAIN) IMPLANT
FACESHIELD WRAPAROUND (MASK) ×3 IMPLANT
FACESHIELD WRAPAROUND OR TEAM (MASK) ×2 IMPLANT
FLOSEAL 10ML (HEMOSTASIS) IMPLANT
GAUZE SPONGE 4X4 12PLY STRL (GAUZE/BANDAGES/DRESSINGS) ×3 IMPLANT
GLOVE BIOGEL PI IND STRL 8 (GLOVE) ×4 IMPLANT
GLOVE BIOGEL PI INDICATOR 8 (GLOVE) ×8
GLOVE ORTHO TXT STRL SZ7.5 (GLOVE) ×3 IMPLANT
GLOVE SURG ORTHO 8.0 STRL STRW (GLOVE) ×3 IMPLANT
GOWN STRL REUS W/ TWL LRG LVL3 (GOWN DISPOSABLE) ×2 IMPLANT
GOWN STRL REUS W/ TWL XL LVL3 (GOWN DISPOSABLE) ×1 IMPLANT
GOWN STRL REUS W/TWL 2XL LVL3 (GOWN DISPOSABLE) ×3 IMPLANT
GOWN STRL REUS W/TWL LRG LVL3 (GOWN DISPOSABLE) ×6
GOWN STRL REUS W/TWL XL LVL3 (GOWN DISPOSABLE) ×3
HANDPIECE INTERPULSE COAX TIP (DISPOSABLE) ×3
HOOD PEEL AWAY FACE SHEILD DIS (HOOD) ×3 IMPLANT
IMMOBILIZER KNEE 22 UNIV (SOFTGOODS) IMPLANT
KIT BASIN OR (CUSTOM PROCEDURE TRAY) ×3 IMPLANT
KIT ROOM TURNOVER OR (KITS) ×3 IMPLANT
MANIFOLD NEPTUNE II (INSTRUMENTS) ×3 IMPLANT
NEEDLE 22X1 1/2 (OR ONLY) (NEEDLE) ×8 IMPLANT
NS IRRIG 1000ML POUR BTL (IV SOLUTION) ×1 IMPLANT
PACK TOTAL JOINT (CUSTOM PROCEDURE TRAY) ×3 IMPLANT
PAD ARMBOARD 7.5X6 YLW CONV (MISCELLANEOUS) ×6 IMPLANT
PAD CAST 4YDX4 CTTN HI CHSV (CAST SUPPLIES) ×1 IMPLANT
PADDING CAST ABS 6INX4YD NS (CAST SUPPLIES) ×2
PADDING CAST ABS COTTON 6X4 NS (CAST SUPPLIES) IMPLANT
PADDING CAST COTTON 4X4 STRL (CAST SUPPLIES) ×3
PADDING CAST COTTON 6X4 STRL (CAST SUPPLIES) ×3 IMPLANT
SET HNDPC FAN SPRY TIP SCT (DISPOSABLE) ×1 IMPLANT
STAPLER VISISTAT 35W (STAPLE) ×3 IMPLANT
SUCTION FRAZIER HANDLE 10FR (MISCELLANEOUS) ×2
SUCTION TUBE FRAZIER 10FR DISP (MISCELLANEOUS) ×1 IMPLANT
SUT ETHIBOND NAB CT1 #1 30IN (SUTURE) ×9 IMPLANT
SUT VIC AB 0 CT1 27 (SUTURE) ×3
SUT VIC AB 0 CT1 27XBRD ANBCTR (SUTURE) ×1 IMPLANT
SUT VIC AB 2-0 CT1 27 (SUTURE) ×6
SUT VIC AB 2-0 CT1 TAPERPNT 27 (SUTURE) ×2 IMPLANT
SYR 20ML ECCENTRIC (SYRINGE) ×2 IMPLANT
SYR CONTROL 10ML LL (SYRINGE) ×6 IMPLANT
SYRINGE 20CC LL (MISCELLANEOUS) ×2 IMPLANT
TOWEL OR 17X24 6PK STRL BLUE (TOWEL DISPOSABLE) ×3 IMPLANT
TOWEL OR 17X26 10 PK STRL BLUE (TOWEL DISPOSABLE) ×3 IMPLANT
TRAY CATH 16FR W/PLASTIC CATH (SET/KITS/TRAYS/PACK) ×6 IMPLANT
TRAY FOLEY W/METER SILVER 16FR (SET/KITS/TRAYS/PACK) IMPLANT
WATER STERILE IRR 1000ML POUR (IV SOLUTION) ×3 IMPLANT

## 2016-06-24 NOTE — Progress Notes (Signed)
Orthopedic Tech Progress Note Patient Details:  Shadavia R Read [birth year][place] Oris/1947 [id number]  CPM Left Knee CPM Left Knee: On Left Knee Flexion (Degrees): 90 Left Knee Extension (Degrees): 0 Additional Comments: Trapeze bar and foot roll   Saul FordyceJennifer C Shauntel Prest 06/24/2016, 10:38 AM

## 2016-06-24 NOTE — Discharge Instructions (Signed)
INSTRUCTIONS AFTER JOINT REPLACEMENT  ° °o Remove items at home which could result in a fall. This includes throw rugs or furniture in walking pathways °o ICE to the affected joint every three hours while awake for 30 minutes at a time, for at least the first 3-5 days, and then as needed for pain and swelling.  Continue to use ice for pain and swelling. You may notice swelling that will progress down to the foot and ankle.  This is normal after surgery.  Elevate your leg when you are not up walking on it.   °o Continue to use the breathing machine you got in the hospital (incentive spirometer) which will help keep your temperature down.  It is common for your temperature to cycle up and down following surgery, especially at night when you are not up moving around and exerting yourself.  The breathing machine keeps your lungs expanded and your temperature down. ° ° °DIET:  As you were doing prior to hospitalization, we recommend a well-balanced diet. ° °DRESSING / WOUND CARE / SHOWERING ° °You may change your dressing 3-5 days after surgery.  Then change the dressing every day with sterile gauze.  Please use good hand washing techniques before changing the dressing.  Do not use any lotions or creams on the incision until instructed by your surgeon. ° °ACTIVITY ° °o Increase activity slowly as tolerated, but follow the weight bearing instructions below.   °o No driving for 6 weeks or until further direction given by your physician.  You cannot drive while taking narcotics.  °o No lifting or carrying greater than 10 lbs. until further directed by your surgeon. °o Avoid periods of inactivity such as sitting longer than an hour when not asleep. This helps prevent blood clots.  °o You may return to work once you are authorized by your doctor.  ° ° ° °WEIGHT BEARING  ° °Weight bearing as tolerated with assist device (walker, cane, etc) as directed, use it as long as suggested by your surgeon or therapist, typically at  least 4-6 weeks. ° ° °EXERCISES ° °Results after joint replacement surgery are often greatly improved when you follow the exercise, range of motion and muscle strengthening exercises prescribed by your doctor. Safety measures are also important to protect the joint from further injury. Any time any of these exercises cause you to have increased pain or swelling, decrease what you are doing until you are comfortable again and then slowly increase them. If you have problems or questions, call your caregiver or physical therapist for advice.  ° °Rehabilitation is important following a joint replacement. After just a few days of immobilization, the muscles of the leg can become weakened and shrink (atrophy).  These exercises are designed to build up the tone and strength of the thigh and leg muscles and to improve motion. Often times heat used for twenty to thirty minutes before working out will loosen up your tissues and help with improving the range of motion but do not use heat for the first two weeks following surgery (sometimes heat can increase post-operative swelling).  ° °These exercises can be done on a training (exercise) mat, on the floor, on a table or on a bed. Use whatever works the best and is most comfortable for you.    Use music or television while you are exercising so that the exercises are a pleasant break in your day. This will make your life better with the exercises acting as a break   in your routine that you can look forward to.   Perform all exercises about fifteen times, three times per day or as directed.  You should exercise both the operative leg and the other leg as well. ° °Exercises include: °  °• Quad Sets - Tighten up the muscle on the front of the thigh (Quad) and hold for 5-10 seconds.   °• Straight Leg Raises - With your knee straight (if you were given a brace, keep it on), lift the leg to 60 degrees, hold for 3 seconds, and slowly lower the leg.  Perform this exercise against  resistance later as your leg gets stronger.  °• Leg Slides: Lying on your back, slowly slide your foot toward your buttocks, bending your knee up off the floor (only go as far as is comfortable). Then slowly slide your foot back down until your leg is flat on the floor again.  °• Angel Wings: Lying on your back spread your legs to the side as far apart as you can without causing discomfort.  °• Hamstring Strength:  Lying on your back, push your heel against the floor with your leg straight by tightening up the muscles of your buttocks.  Repeat, but this time bend your knee to a comfortable angle, and push your heel against the floor.  You may put a pillow under the heel to make it more comfortable if necessary.  ° °A rehabilitation program following joint replacement surgery can speed recovery and prevent re-injury in the future due to weakened muscles. Contact your doctor or a physical therapist for more information on knee rehabilitation.  ° ° °CONSTIPATION ° °Constipation is defined medically as fewer than three stools per week and severe constipation as less than one stool per week.  Even if you have a regular bowel pattern at home, your normal regimen is likely to be disrupted due to multiple reasons following surgery.  Combination of anesthesia, postoperative narcotics, change in appetite and fluid intake all can affect your bowels.  ° °YOU MUST use at least one of the following options; they are listed in order of increasing strength to get the job done.  They are all available over the counter, and you may need to use some, POSSIBLY even all of these options:   ° °Drink plenty of fluids (prune juice may be helpful) and high fiber foods °Colace 100 mg by mouth twice a day  °Senokot for constipation as directed and as needed Dulcolax (bisacodyl), take with full glass of water  °Miralax (polyethylene glycol) once or twice a day as needed. ° °If you have tried all these things and are unable to have a bowel  movement in the first 3-4 days after surgery call either your surgeon or your primary doctor.   ° °If you experience loose stools or diarrhea, hold the medications until you stool forms back up.  If your symptoms do not get better within 1 week or if they get worse, check with your doctor.  If you experience "the worst abdominal pain ever" or develop nausea or vomiting, please contact the office immediately for further recommendations for treatment. ° ° °ITCHING:  If you experience itching with your medications, try taking only a single pain pill, or even half a pain pill at a time.  You can also use Benadryl over the counter for itching or also to help with sleep.  ° °TED HOSE STOCKINGS:  Use stockings on both legs until for at least 2 weeks or as   directed by physician office. They may be removed at night for sleeping. ° °MEDICATIONS:  See your medication summary on the “After Visit Summary” that nursing will review with you.  You may have some home medications which will be placed on hold until you complete the course of blood thinner medication.  It is important for you to complete the blood thinner medication as prescribed. ° °PRECAUTIONS:  If you experience chest pain or shortness of breath - call 911 immediately for transfer to the hospital emergency department.  ° °If you develop a fever greater that 101 F, purulent drainage from wound, increased redness or drainage from wound, foul odor from the wound/dressing, or calf pain - CONTACT YOUR SURGEON.   °                                                °FOLLOW-UP APPOINTMENTS:  If you do not already have a post-op appointment, please call the office for an appointment to be seen by your surgeon.  Guidelines for how soon to be seen are listed in your “After Visit Summary”, but are typically between 1-4 weeks after surgery. ° °OTHER INSTRUCTIONS:  ° °Knee Replacement:  Do not place pillow under knee, focus on keeping the knee straight while resting. CPM  instructions: 0-90 degrees, 2 hours in the morning, 2 hours in the afternoon, and 2 hours in the evening. Place foam block, curve side up under heel at all times except when in CPM or when walking.  DO NOT modify, tear, cut, or change the foam block in any way. ° °MAKE SURE YOU:  °• Understand these instructions.  °• Get help right away if you are not doing well or get worse.  ° ° °Thank you for letting us be a part of your medical care team.  It is a privilege we respect greatly.  We hope these instructions will help you stay on track for a fast and full recovery!  ° °Information on my medicine - ELIQUIS® (apixaban) ° °This medication education was reviewed with me or my healthcare representative as part of my discharge preparation.  The pharmacist that spoke with me during my hospital stay was:  Lashanda Storlie Brown, RPH ° °Why was Eliquis® prescribed for you? °Eliquis® was prescribed for you to reduce the risk of blood clots forming after orthopedic surgery.   ° °What do You need to know about Eliquis®? °Take your Eliquis® TWICE DAILY - one tablet in the morning and one tablet in the evening with or without food.  It would be best to take the dose about the same time each day. ° °If you have difficulty swallowing the tablet whole please discuss with your pharmacist how to take the medication safely. ° °Take Eliquis® exactly as prescribed by your doctor and DO NOT stop taking Eliquis® without talking to the doctor who prescribed the medication.  Stopping without other medication to take the place of Eliquis® may increase your risk of developing a clot. ° °After discharge, you should have regular check-up appointments with your healthcare provider that is prescribing your Eliquis®. ° °What do you do if you miss a dose? °If a dose of ELIQUIS® is not taken at the scheduled time, take it as soon as possible on the same day and twice-daily administration should be resumed.  The dose should not be doubled   to make up  for a missed dose.  Do not take more than one tablet of ELIQUIS at the same time. ° °Important Safety Information °A possible side effect of Eliquis® is bleeding. You should call your healthcare provider right away if you experience any of the following: °? Bleeding from an injury or your nose that does not stop. °? Unusual colored urine (red or dark brown) or unusual colored stools (red or black). °? Unusual bruising for unknown reasons. °? A serious fall or if you hit your head (even if there is no bleeding). ° °Some medicines may interact with Eliquis® and might increase your risk of bleeding or clotting while on Eliquis®. To help avoid this, consult your healthcare provider or pharmacist prior to using any new prescription or non-prescription medications, including herbals, vitamins, non-steroidal anti-inflammatory drugs (NSAIDs) and supplements. ° °This website has more information on Eliquis® (apixaban): http://www.eliquis.com/eliquis/home ° °

## 2016-06-24 NOTE — Transfer of Care (Signed)
Immediate Anesthesia Transfer of Care Note  Patient: Sandy Hodges  Procedure(s) Performed: Procedure(s): LEFT TOTAL KNEE ARTHROPLASTY (Left)  Patient Location: PACU  Anesthesia Type:MAC and Spinal  Level of Consciousness: awake, alert  and patient cooperative  Airway & Oxygen Therapy: Patient Spontanous Breathing and Patient connected to nasal cannula oxygen  Post-op Assessment: Report given to RN, Post -op Vital signs reviewed and stable, Patient moving all extremities X 4 and Patient able to stick tongue midline  Post vital signs: Reviewed and stable  Last Vitals:  Vitals:   06/24/16 0546 06/24/16 0957  BP: (!) 158/70 136/67  Pulse: (!) 55   Resp: 18   Temp: 36.7 C (P) 36.1 C    Last Pain:  Vitals:   06/24/16 0554  TempSrc:   PainSc: 3       Patients Stated Pain Goal: 6 (32/67/12 4580)  Complications: No apparent anesthesia complications

## 2016-06-24 NOTE — Brief Op Note (Signed)
06/24/2016  9:40 AM  PATIENT:  Hunt OrisBetty R Campanella  71 y.o. female  PRE-OPERATIVE DIAGNOSIS:  OA LEFT KNEE  POST-OPERATIVE DIAGNOSIS:  OA LEFT KNEE  PROCEDURE:  Procedure(s): LEFT TOTAL KNEE ARTHROPLASTY (Left)  SURGEON:  Surgeon(s) and Role:    Frederico Hamman* Caffrey, Daniel, MD - Primary  PHYSICIAN ASSISTANT: Margart SicklesJoshua Jesiah Grismer, PA-C  ASSISTANTS:   ANESTHESIA:   local, regional and spinal  EBL:  Total I/O In: 2000 [I.V.:2000] Out: 10 [Blood:10]  BLOOD ADMINISTERED:none  DRAINS: none   LOCAL MEDICATIONS USED:  MARCAINE     SPECIMEN:  No Specimen  DISPOSITION OF SPECIMEN:  N/A  COUNTS:  YES  TOURNIQUET:   Total Tourniquet Time Documented: Thigh (Left) - -80598 minutes Total: Thigh (Left) - -16109-80598 minutes   DICTATION: .Other Dictation: Dictation Number unknown  PLAN OF CARE: Admit to inpatient   PATIENT DISPOSITION:  PACU - hemodynamically stable.   Delay start of Pharmacological VTE agent (>24hrs) due to surgical blood loss or risk of bleeding: yes

## 2016-06-24 NOTE — Anesthesia Procedure Notes (Signed)
Spinal  Patient location during procedure: OR Start time: 06/24/2016 7:32 AM End time: 06/24/2016 7:41 AM Staffing Anesthesiologist: Val EagleMOSER, Riann Oman Preanesthetic Checklist Completed: patient identified, surgical consent, pre-op evaluation, timeout performed, IV checked, risks and benefits discussed and monitors and equipment checked Spinal Block Patient position: sitting Prep: ChloraPrep and site prepped and draped Patient monitoring: heart rate, cardiac monitor, continuous pulse ox and blood pressure Approach: midline Location: L3-4 Injection technique: single-shot Needle Needle type: Pencan  Needle gauge: 24 G Needle length: 10 cm Assessment Sensory level: T6

## 2016-06-24 NOTE — OR Nursing (Signed)
Bladder scan performed. Approximately 168 cc post I and O cath.

## 2016-06-24 NOTE — Anesthesia Procedure Notes (Signed)
Anesthesia Regional Block: Adductor canal block   Pre-Anesthetic Checklist: ,, timeout performed, Correct Patient, Correct Site, Correct Laterality, Correct Procedure, Correct Position, site marked, Risks and benefits discussed,  Surgical consent,  Pre-op evaluation,  At surgeon's request and post-op pain management  Laterality: Lower and Left  Prep: chloraprep       Needles:  Injection technique: Single-shot  Needle Type: Echogenic Stimulator Needle          Additional Needles:   Procedures: ultrasound guided,,,,,,,,  Narrative:  Start time: 06/24/2016 7:17 AM End time: 06/24/2016 7:23 AM Injection made incrementally with aspirations every 5 mL.  Performed by: Personally  Anesthesiologist: Amaan Meyer  Additional Notes: H+P and labs reviewed, risks and benefits discussed with patient, procedure tolerated well without complications

## 2016-06-24 NOTE — H&P (View-Only) (Signed)
TOTAL KNEE ADMISSION H&P  Patient is being admitted for left total knee arthroplasty.  Subjective:  Chief Complaint:left knee pain.  HPI: Sandy Hodges, 71 y.o. female, has a history of pain and functional disability in the left knee due to arthritis and has failed non-surgical conservative treatments for greater than 12 weeks to includeNSAID's and/or analgesics, corticosteriod injections, viscosupplementation injections, use of assistive devices and activity modification.  Onset of symptoms was gradual, starting >10 years ago with gradually worsening course since that time. The patient noted no past surgery on the left knee(s).  Patient currently rates pain in the left knee(s) at 10 out of 10 with activity. Patient has night pain, worsening of pain with activity and weight bearing, pain that interferes with activities of daily living, pain with passive range of motion, crepitus and joint swelling.  Patient has evidence of periarticular osteophytes and joint space narrowing by imaging studies.There is no active infection.  Patient Active Problem List   Diagnosis Date Noted  . Primary localized osteoarthritis of right knee 10/16/2015  . Depression 10/16/2014  . Arthritis 11/14/2013  . Essential hypertension 11/14/2013  . Preventive measure 11/14/2013  . Uterine mass 08/01/2013  . Deficiency anemia 07/09/2013  . Fatigue 07/09/2013  . Knee pain 07/09/2013  . Back pain 07/09/2013  . Abdominal pain, unspecified site 07/09/2013  . Leukopenia 07/09/2013  . Flank pain 07/09/2013  . Pancytopenia (HCC) 12/14/2011   Past Medical History:  Diagnosis Date  . Abdominal pain, unspecified site 07/09/2013  . Arthritis 11/14/2013  . Back pain 07/09/2013  . Depression 10/16/2014  . Essential hypertension 11/14/2013  . Fatigue 07/09/2013  . Fibromyalgia   . Hyperlipidemia   . Hypertension   . Knee pain 07/09/2013  . OA (osteoarthritis) of knee   . Pancytopenia (HCC) 12/14/2011  . Pancytopenia  (HCC)   . PONV (postoperative nausea and vomiting)   . Unspecified deficiency anemia 07/09/2013    Past Surgical History:  Procedure Laterality Date  . COLONOSCOPY    . ROTATOR CUFF REPAIR Right 09/2014  . TOTAL KNEE ARTHROPLASTY Right 10/16/2015   Procedure: TOTAL KNEE ARTHROPLASTY;  Surgeon: Frederico Hamman, MD;  Location: [facility] OR;  Service: Orthopedics;  Laterality: Right;     (Not in a hospital admission) No Known Allergies  Social History  Substance Use Topics  . Smoking status: Never Smoker  . Smokeless tobacco: Never Used  . Alcohol use No    Family History  Problem Relation Age of Onset  . Cancer Mother        breast ca  . Hypertension Father   . CVA Father      Review of Systems  Constitutional: Positive for weight loss.  Musculoskeletal: Positive for joint pain.  All other systems reviewed and are negative.   Objective:  Physical Exam  Constitutional: She is oriented to person, place, and time. She appears well-developed and well-nourished. No distress.  HENT:  Head: Normocephalic and atraumatic.  Nose: Nose normal.  Eyes: Conjunctivae and EOM are normal. Pupils are equal, round, and reactive to light.  Neck: Normal range of motion. Neck supple.  Cardiovascular: Normal rate, regular rhythm, normal heart sounds and intact distal pulses.   Respiratory: Effort normal and breath sounds normal. No respiratory distress. She has no wheezes.  GI: Soft. Bowel sounds are normal. She exhibits no distension. There is no tenderness.  Musculoskeletal:       Left knee: She exhibits decreased range of motion and swelling. Tenderness found.  Lymphadenopathy:  She has no cervical adenopathy.  Neurological: She is alert and oriented to person, place, and time. No cranial nerve deficit.  Skin: Skin is warm and dry. No rash noted. No erythema.  Psychiatric: She has a normal mood and affect. Her behavior is normal.    Vital signs in last 24  hours: @VSRANGES @  Labs:   Estimated body mass index is 24.2 kg/m as calculated from the following:   Height as of 11/23/15: 5\' 5"  (1.651 m).   Weight as of 11/23/15: 66 kg (145 lb 6.4 oz).   Imaging Review Plain radiographs demonstrate moderate degenerative joint disease of the left knee(s). The overall alignment ismild varus. The bone quality appears to be good for age and reported activity level.  Assessment/Plan:  End stage arthritis, left knee   The patient history, physical examination, clinical judgment of the provider and imaging studies are consistent with end stage degenerative joint disease of the left knee(s) and total knee arthroplasty is deemed medically necessary. The treatment options including medical management, injection therapy arthroscopy and arthroplasty were discussed at length. The risks and benefits of total knee arthroplasty were presented and reviewed. The risks due to aseptic loosening, infection, stiffness, patella tracking problems, thromboembolic complications and other imponderables were discussed. The patient acknowledged the explanation, agreed to proceed with the plan and consent was signed. Patient is being admitted for inpatient treatment for surgery, pain control, PT, OT, prophylactic antibiotics, VTE prophylaxis, progressive ambulation and ADL's and discharge planning. The patient is planning to be discharged home with home health services

## 2016-06-24 NOTE — Anesthesia Preprocedure Evaluation (Signed)
Anesthesia Evaluation  Patient identified by MRN, date of birth, ID band Patient awake    Reviewed: Allergy & Precautions, H&P , NPO status , Patient's Chart, lab work & pertinent test results, reviewed documented beta blocker date and time   History of Anesthesia Complications (+) PONV and history of anesthetic complications  Airway Mallampati: II  TM Distance: >3 FB Neck ROM: Full    Dental  (+) Teeth Intact, Dental Advisory Given   Pulmonary neg pulmonary ROS,    breath sounds clear to auscultation       Cardiovascular hypertension, On Medications + dysrhythmias  Rhythm:Regular Rate:Normal     Neuro/Psych PSYCHIATRIC DISORDERS  Neuromuscular disease    GI/Hepatic negative GI ROS,   Endo/Other  negative endocrine ROS  Renal/GU negative Renal ROS     Musculoskeletal  (+) Arthritis , Fibromyalgia -  Abdominal   Peds  Hematology  (+) anemia , plt 130ks   Anesthesia Other Findings   Reproductive/Obstetrics negative OB ROS                             Anesthesia Physical Anesthesia Plan  ASA: II  Anesthesia Plan: MAC, Regional and Spinal   Post-op Pain Management:  Regional for Post-op pain   Induction:   PONV Risk Score and Plan: 3 and Ondansetron, Dexamethasone, Propofol, Midazolam and Treatment may vary due to age  Airway Management Planned: Natural Airway, Simple Face Mask and Nasal Cannula  Additional Equipment: None  Intra-op Plan:   Post-operative Plan:   Informed Consent: I have reviewed the patients History and Physical, chart, labs and discussed the procedure including the risks, benefits and alternatives for the proposed anesthesia with the patient or authorized representative who has indicated his/her understanding and acceptance.   Dental advisory given  Plan Discussed with: CRNA and Surgeon  Anesthesia Plan Comments:         Anesthesia Quick  Evaluation

## 2016-06-24 NOTE — Interval H&P Note (Signed)
History and Physical Interval Note:  06/24/2016 7:26 AM  Sandy Hodges  has presented today for surgery, with the diagnosis of OA LEFT KNEE  The various methods of treatment have been discussed with the patient and family. After consideration of risks, benefits and other options for treatment, the patient has consented to  Procedure(s): TOTAL KNEE ARTHROPLASTY (Left) as a surgical intervention .  The patient's history has been reviewed, patient examined, no change in status, stable for surgery.  I have reviewed the patient's chart and labs.  Questions were answered to the patient's satisfaction.     Taquila Leys JR,W D

## 2016-06-24 NOTE — Evaluation (Signed)
Physical Therapy Evaluation Patient Details Name: Sandy Hodges MRN: [id number] DOB: 02/26/1945 Today's Date: 06/24/2016   History of Present Illness  Pt is a 71 y/o female s/p L TKA. PMH including but not limited to R TKA in 2017, HTN and fibromyalgia.  Clinical Impression  Pt presented supine in bed with HOB elevated, awake and willing to participate in therapy session. Prior to admission, pt reported that she was independent with all functional mobility and ADLs. Pt lives alone but has family that will be with her 24/7. Pt moving well POD#0. She tolerated ambulating in hallway with min guard and use of RW. PT reviewed positioning of L LE (i.e. - no pillow/towel roll behind knee) with pt and pt's family. Pt would continue to benefit from skilled physical therapy services at this time while admitted and after d/c to address the below listed limitations in order to improve overall safety and independence with functional mobility.     Follow Up Recommendations Home health PT;Supervision/Assistance - 24 hour    Equipment Recommendations  None recommended by PT    Recommendations for Other Services       Precautions / Restrictions Precautions Precautions: Knee;Fall Precaution Comments: PT reviewed positioning of LE following TKA surgery with pt and pt's family. Restrictions Weight Bearing Restrictions: Yes LLE Weight Bearing: Weight bearing as tolerated      Mobility  Bed Mobility Overal bed mobility: Needs Assistance Bed Mobility: Supine to Sit     Supine to sit: Supervision     General bed mobility comments: increased time and effort, supervision for safety  Transfers Overall transfer level: Needs assistance Equipment used: Rolling walker (2 wheeled) Transfers: Sit to/from Stand Sit to Stand: Min guard         General transfer comment: increased time, min guard for safety  Ambulation/Gait Ambulation/Gait assistance: Min guard Ambulation Distance (Feet): 150  Feet Assistive device: Rolling walker (2 wheeled) Gait Pattern/deviations: Step-through pattern;Decreased step length - right;Decreased stance time - left;Decreased stride length;Decreased weight shift to left Gait velocity: decreased Gait velocity interpretation: Below normal speed for age/gender General Gait Details: mild instability with ambulation but no overt LOB or need for physical assistance, min guard for safety. pt demonstrated good technique with RW.  Stairs            Wheelchair Mobility    Modified Rankin (Stroke Patients Only)       Balance Overall balance assessment: Needs assistance Sitting-balance support: Feet supported Sitting balance-Leahy Scale: Good     Standing balance support: During functional activity;Bilateral upper extremity supported Standing balance-Leahy Scale: Poor Standing balance comment: pt reliant on bilateral UEs on RW                             Pertinent Vitals/Pain Pain Assessment: 0-10 Pain Score: 4  Pain Location: L knee Pain Descriptors / Indicators: Sore Pain Intervention(s): Monitored during session;Repositioned    Home Living Family/patient expects to be discharged to:: Private residence Living Arrangements: Alone Available Help at Discharge: Family;Available 24 hours/day Type of Home: House Home Access: Stairs to enter Entrance Stairs-Rails: Doctor, general practice of Steps: 3 Home Layout: One level Home Equipment: Shower seat;Grab bars - tub/shower;Walker - 2 wheels;Bedside commode      Prior Function Level of Independence: Independent               Hand Dominance        Extremity/Trunk Assessment   Upper Extremity  Assessment Upper Extremity Assessment: Overall WFL for tasks assessed    Lower Extremity Assessment Lower Extremity Assessment: LLE deficits/detail LLE Deficits / Details: Pt with decreased strength and ROM limitations secondary to post-op. Sensation diminished to  light touch on L heel.       Communication   Communication: No difficulties  Cognition Arousal/Alertness: Awake/alert Behavior During Therapy: WFL for tasks assessed/performed Overall Cognitive Status: Within Functional Limits for tasks assessed                                        General Comments      Exercises Total Joint Exercises Long Arc Quad: AROM;Left;15 reps;Seated Knee Flexion: AROM;Strengthening;Left;Seated;15 reps Marching in Standing: AROM;Strengthening;Both;20 reps;Seated   Assessment/Plan    PT Assessment Patient needs continued PT services  PT Problem List Decreased strength;Decreased range of motion;Decreased activity tolerance;Decreased mobility;Decreased coordination;Decreased balance;Decreased knowledge of use of DME;Decreased knowledge of precautions;Pain       PT Treatment Interventions DME instruction;Stair training;Gait training;Functional mobility training;Therapeutic activities;Therapeutic exercise;Balance training;Neuromuscular re-education;Patient/family education    PT Goals (Current goals can be found in the Care Plan section)  Acute Rehab PT Goals Patient Stated Goal: return home tomorrow PT Goal Formulation: With patient/family Time For Goal Achievement: 07/08/16 Potential to Achieve Goals: Good    Frequency 7X/week   Barriers to discharge        Co-evaluation               AM-PAC PT "6 Clicks" Daily Activity  Outcome Measure Difficulty turning over in bed (including adjusting bedclothes, sheets and blankets)?: A Little Difficulty moving from lying on back to sitting on the side of the bed? : A Little Difficulty sitting down on and standing up from a chair with arms (e.g., wheelchair, bedside commode, etc,.)?: Total Help needed moving to and from a bed to chair (including a wheelchair)?: A Little Help needed walking in hospital room?: A Little Help needed climbing 3-5 steps with a railing? : A Lot 6 Click  Score: 15    End of Session Equipment Utilized During Treatment: Gait belt Activity Tolerance: Patient tolerated treatment well Patient left: in chair;with call bell/phone within reach;with family/visitor present Nurse Communication: Mobility status PT Visit Diagnosis: Other abnormalities of gait and mobility (R26.89);Pain Pain - Right/Left: Left Pain - part of body: Knee    Time: 1451-1511 PT Time Calculation (min) (ACUTE ONLY): 20 min   Charges:   PT Evaluation $PT Eval Moderate Complexity: 1 Procedure     PT G Codes:        Deborah ChalkJennifer Sharmon Cheramie, PT, DPT [phone]   Alessandra BevelsJennifer M Camyah Pultz 06/24/2016, 4:10 PM

## 2016-06-24 NOTE — Anesthesia Postprocedure Evaluation (Signed)
Anesthesia Post Note  Patient: Sandy Hodges  Procedure(s) Performed: Procedure(s) (LRB): LEFT TOTAL KNEE ARTHROPLASTY (Left)     Patient location during evaluation: PACU Anesthesia Type: Regional, MAC and Spinal Level of consciousness: awake and alert Pain management: pain level controlled Vital Signs Assessment: post-procedure vital signs reviewed and stable Respiratory status: spontaneous breathing, nonlabored ventilation, respiratory function stable and patient connected to nasal cannula oxygen Cardiovascular status: stable and blood pressure returned to baseline Postop Assessment: spinal receding and no signs of nausea or vomiting Anesthetic complications: no    Last Vitals:  Vitals:   06/24/16 1215 06/24/16 1235  BP: (!) 164/96 (!) 172/69  Pulse: 64 (!) 57  Resp: 15   Temp: 36.4 C 36.7 C    Last Pain:  Vitals:   06/24/16 1235  TempSrc: Oral  PainSc:                  [name]

## 2016-06-24 NOTE — Op Note (Signed)
NAME:  Sandy Hodges, Sandy Hodges              ACCOUNT NO.:  [id number]  MEDICAL RECORD NO.:  [id number]  LOCATION:  [facility]                         FACILITY:  [facility]  PHYSICIAN:  Dyke BrackettW. D. Cynara Tatham, M.D.    DATE OF BIRTH:  Oct 18, 1945  DATE OF PROCEDURE:  06/24/2016 DATE OF DISCHARGE:                              OPERATIVE REPORT   PREOPERATIVE DIAGNOSIS:  Osteoarthritis, left knee.  POSTOPERATIVE DIAGNOSIS:  Osteoarthritis, left knee.  OPERATION:  Left total knee replacement (Sigma cemented knee, size femur 3, tibia 3, 10-mm bearing with 35 mm all-poly patella.  SURGEON:  Dyke BrackettW. D. Anayia Eugene, M.D.  ASSISTANVincent Peyer:  [place], PA.  ANESTHESIA:  Spinal.  TOURNIQUET TIME:  50 minute.  DESCRIPTION OF PROCEDURE:  Supine position, inflation of the tourniquet to 350.  Medial parapatellar approach to the knee was made.  She had a moderate varus deformity with flexion contracture.  We stripped the medial side of the knee due to the varus deformity.  Femur was cut with an 11-mm 5-degree valgus cut followed by cutting about 3-4 mm below the most-diseased medial compartment with the extension gap measured at 10 mm.  Femur was sized to be a size 3 followed by placement of an all-in-1 cutting block with appropriate degree of external rotation with anterior and posterior chamfer cuts.  PCL was released.  Excess menisci were removed from the knee as well as posterior osteophytes.  Flexion gap equaled the extension gap at 10 mm.  Patella was cut leaving 14 to 15-mm thickness for a 35-mm all-poly trial.  Carolin GuernseyKeel was cut for the tibia followed by the box cut on the femur.  Trials were placed, full extension.  Good resolution of the varus deformity was noted balancing the ligaments.  No tendency for bearing spin out.  Cement was prepared on the back table.  Pulsatile irrigation was used on the bone as well as infiltrating the subcutaneous tissues and capsule with a mixture of Marcaine with epinephrine and Exparel.  In addition,  tranexamic sponge was used to minimize any blood loss after the tourniquet was released. The final prosthesis was inserted with the tourniquet up, specifically with the trial bearing.  Cement was allowed to harden.  Trial bearing was removed.  No excess cement was noted.  Copious irrigation again carried out.  Tourniquet was released with the trial bearing removed.  Final bearing was placed. Again, all parameters were deemed to be acceptable.  Closure was affected with #1 Ethibond, 2-0 Vicryl, and skin clips.  Lightly compressive sterile dressing and knee immobilizer were applied, taken to the recovery in stable condition.     Dyke BrackettW. D. Genieve Ramaswamy, M.D.     WDC/MEDQ  D:  06/24/2016  T:  06/24/2016  Job:  [id number]

## 2016-06-25 DIAGNOSIS — M1712 Unilateral primary osteoarthritis, left knee: Secondary | ICD-10-CM | POA: Diagnosis not present

## 2016-06-25 DIAGNOSIS — M25562 Pain in left knee: Secondary | ICD-10-CM | POA: Diagnosis not present

## 2016-06-25 LAB — CBC
HCT: 31.8 % — ABNORMAL LOW (ref 36.0–46.0)
Hemoglobin: 10.1 g/dL — ABNORMAL LOW (ref 12.0–15.0)
MCH: 29.9 pg (ref 26.0–34.0)
MCHC: 31.8 g/dL (ref 30.0–36.0)
MCV: 94.1 fL (ref 78.0–100.0)
Platelets: 142 10*3/uL — ABNORMAL LOW (ref 150–400)
RBC: 3.38 MIL/uL — AB (ref 3.87–5.11)
RDW: 13 % (ref 11.5–15.5)
WBC: 8.7 10*3/uL (ref 4.0–10.5)

## 2016-06-25 LAB — BASIC METABOLIC PANEL
ANION GAP: 10 (ref 5–15)
BUN: 20 mg/dL (ref 6–20)
CALCIUM: 9.6 mg/dL (ref 8.9–10.3)
CO2: 26 mmol/L (ref 22–32)
Chloride: 103 mmol/L (ref 101–111)
Creatinine, Ser: 1.06 mg/dL — ABNORMAL HIGH (ref 0.44–1.00)
GFR, EST AFRICAN AMERICAN: 59 mL/min — AB (ref >60)
GFR, EST NON AFRICAN AMERICAN: 51 mL/min — AB (ref >60)
GLUCOSE: 119 mg/dL — AB (ref 65–99)
POTASSIUM: 4.2 mmol/L (ref 3.5–5.1)
SODIUM: 139 mmol/L (ref 135–145)

## 2016-06-25 MED ORDER — DOCUSATE SODIUM 100 MG PO CAPS: 100.0000 mg | ORAL_CAPSULE | Freq: Two times a day (BID) | ORAL | 0 refills | Status: DC

## 2016-06-25 MED ORDER — SODIUM CHLORIDE 0.9 % IV BOLUS (SEPSIS): 500.0000 mL | Freq: Once | INTRAVENOUS | Status: DC

## 2016-06-25 NOTE — Discharge Summary (Signed)
Patient ID: Sandy Hodges MRN: [id number] DOB/AGE: Jun 16, 1945 71 y.o.  Admit date: 06/24/2016 Discharge date: 06/25/2016  Admission Diagnoses:  Active Problems:   Primary localized osteoarthritis of left knee   Discharge Diagnoses:  Same  Past Medical History:  Diagnosis Date  . Abdominal pain, unspecified site 07/09/2013  . Anxiety   . Arthritis 11/14/2013  . Back pain 07/09/2013  . Blood dyscrasia    Pancytopenial; [facility] Dr. Bertis RuddyGorsuch  . Depression 10/16/2014  . Essential hypertension 11/14/2013  . Fatigue 07/09/2013  . Fibromyalgia   . Hyperlipidemia   . Hypertension   . Knee pain 07/09/2013  . OA (osteoarthritis) of knee   . Pancytopenia (HCC) 12/14/2011  . Pancytopenia (HCC)   . PONV (postoperative nausea and vomiting)    nausea an vomiting  . Unspecified deficiency anemia 07/09/2013    Surgeries: Procedure(s): LEFT TOTAL KNEE ARTHROPLASTY on 06/24/2016   Consultants:   Discharged Condition: Improved  Hospital Course: Sandy OrisBetty R Mathisen is an 71 y.o. female who was admitted 06/24/2016 for operative treatment of<principal problem not specified>. Patient has severe unremitting pain that affects sleep, daily activities, and work/hobbies. After pre-op clearance the patient was taken to the operating room on 06/24/2016 and underwent  Procedure(s): LEFT TOTAL KNEE ARTHROPLASTY.    Patient was given perioperative antibiotics: Anti-infectives    Start     Dose/Rate Route Frequency Ordered Stop   06/24/16 1600  ceFAZolin (ANCEF) IVPB 1 g/50 mL premix     1 g 100 mL/hr over 30 Minutes Intravenous Every 8 hours 06/24/16 1327 06/25/16 0114   06/24/16 1400  ceFAZolin (ANCEF) IVPB 1 g/50 mL premix  Status:  Discontinued     1 g 100 mL/hr over 30 Minutes Intravenous Every 6 hours 06/24/16 1301 06/24/16 1327   06/24/16 0700  ceFAZolin (ANCEF) IVPB 2g/100 mL premix     2 g 200 mL/hr over 30 Minutes Intravenous To ShortStay Surgical 06/23/16 0835 06/24/16 0752        Patient was given sequential compression devices, early ambulation, and chemoprophylaxis to prevent DVT.  Patient benefited maximally from hospital stay and there were no complications.    Recent vital signs: Patient Vitals for the past 24 hrs:  BP Temp Temp src Pulse Resp SpO2  06/25/16 0546 138/64 98.2 F (36.8 C) Oral 60 20 100 %  06/25/16 0101 130/63 97.8 F (36.6 C) Oral 63 20 97 %  06/24/16 2100 (!) 147/77 98.6 F (37 C) Oral 76 - 96 %  06/24/16 1235 (!) 172/69 98.1 F (36.7 C) Oral (!) 57 - 100 %  06/24/16 1215 (!) 164/96 97.6 F (36.4 C) - 64 15 100 %  06/24/16 1200 - - - (!) 57 14 99 %  06/24/16 1145 - 97.6 F (36.4 C) - (!) 57 17 100 %  06/24/16 1141 (!) 159/72 - - (!) 59 18 100 %  06/24/16 1130 - - - (!) 56 15 100 %  06/24/16 1126 (!) 165/71 - - (!) 56 (!) 23 100 %  06/24/16 1115 - - - (!) 51 13 100 %  06/24/16 1111 (!) 162/71 - - (!) 55 16 100 %  06/24/16 1100 - 97.3 F (36.3 C) - (!) 54 14 100 %  06/24/16 1056 (!) 152/71 - - (!) 54 15 100 %  06/24/16 1045 - - - (!) 57 14 100 %  06/24/16 1041 (!) 153/67 - - (!) 52 11 100 %  06/24/16 1030 - - - (!) 54  11 100 %  06/24/16 1026 (!) 146/67 - - (!) 53 12 100 %  06/24/16 1015 - - - (!) 57 16 100 %  06/24/16 1011 136/66 - - (!) 55 12 99 %  06/24/16 1000 - - - 60 12 98 %  06/24/16 0957 136/67 97 F (36.1 C) - 63 17 99 %     Recent laboratory studies:  Recent Labs  06/25/16 0442  WBC 8.7  HGB 10.1*  HCT 31.8*  PLT 142*  NA 139  K 4.2  CL 103  CO2 26  BUN 20  CREATININE 1.06*  GLUCOSE 119*  CALCIUM 9.6     Discharge Medications:   Allergies as of 06/25/2016      Reactions   No Known Allergies       Medication List    STOP taking these medications   diclofenac 75 MG EC tablet Commonly known as:  VOLTAREN     TAKE these medications   acetaminophen 500 MG tablet Commonly known as:  TYLENOL Take 1,000 mg by mouth 2 (two) times daily as needed (for pain.).   ALPRAZolam 1 MG tablet Commonly  known as:  XANAX Take 0.5-1 mg by mouth 2 (two) times daily. 0.5 mg daily and 1 mg at bedtime.   apixaban 2.5 MG Tabs tablet Commonly known as:  ELIQUIS Take 1 tablet (2.5 mg total) by mouth 2 (two) times daily.   atenolol 25 MG tablet Commonly known as:  TENORMIN Take 50 mg by mouth every evening.   CALCIUM 600+D PLUS MINERALS PO Take 1 tablet by mouth 2 (two) times daily.   docusate sodium 100 MG capsule Commonly known as:  COLACE Take 1 capsule (100 mg total) by mouth 2 (two) times daily.   fenofibrate 145 MG tablet Commonly known as:  TRICOR Take 145 mg by mouth every evening.   ferrous sulfate 325 (65 FE) MG tablet Take 325 mg by mouth See admin instructions. Take 2-3 times weekly due to side effects   fish oil-omega-3 fatty acids 1000 MG capsule Take 2 g by mouth 2 (two) times daily.   lisinopril-hydrochlorothiazide 20-12.5 MG tablet Commonly known as:  PRINZIDE,ZESTORETIC Take 1 tablet by mouth every evening.   loratadine 10 MG tablet Commonly known as:  CLARITIN Take 10 mg by mouth 2 (two) times daily.   multivitamin with minerals Tabs tablet Take 1 tablet by mouth every evening. One-A-Day Women 50+   omeprazole 20 MG tablet Commonly known as:  PRILOSEC OTC Take 20 mg by mouth daily as needed (with diclofenac use for ulcer/reflux prevention).   ondansetron 4 MG tablet Commonly known as:  ZOFRAN Take 1 tablet (4 mg total) by mouth every 8 (eight) hours as needed for nausea or vomiting.   oxyCODONE-acetaminophen 5-325 MG tablet Commonly known as:  ROXICET Take 1-2 tablets by mouth every 4 (four) hours as needed.   PROBIOTIC PO Take 1 capsule by mouth every evening.   simvastatin 20 MG tablet Commonly known as:  ZOCOR Take 20 mg by mouth every evening.   Vitamin D-3 1000 units Caps Take 1,000 Units by mouth daily.       Diagnostic Studies: No results found.  Disposition: 06-Home-Health Care Svc    Follow-up Information    Frederico Hamman,  MD. Schedule an appointment as soon as possible for a visit in 2 weeks.   Specialty:  Orthopedic Surgery Contact information: [address] [place] [address] [phone]  SignedPascal Lux 06/25/2016, 8:03 AM

## 2016-06-25 NOTE — Progress Notes (Signed)
Physical Therapy Treatment Patient Details Name: Sandy OrisBetty R Overdorf MRN: [id number] DOB: Jul 24, 1945 Today's Date: 06/25/2016    History of Present Illness Pt is a 71 y/o female s/p L TKA. PMH including but not limited to R TKA in 2017, HTN and fibromyalgia.    PT Comments    Patient is making good progress with PT.  From a mobility standpoint anticipate patient will be ready for DC home when medically ready.    Follow Up Recommendations  Home health PT;Supervision/Assistance - 24 hour     Equipment Recommendations  None recommended by PT    Recommendations for Other Services       Precautions / Restrictions Precautions Precautions: Knee;Fall Precaution Comments: reviewed precautions/positioning  Restrictions Weight Bearing Restrictions: Yes LLE Weight Bearing: Weight bearing as tolerated    Mobility  Bed Mobility Overal bed mobility: Modified Independent Bed Mobility: Supine to Sit     Supine to sit: Supervision;HOB elevated     General bed mobility comments: increased time; supervision for safety  Transfers Overall transfer level: Needs assistance Equipment used: Rolling walker (2 wheeled) Transfers: Sit to/from Stand Sit to Stand: Supervision         General transfer comment: supervision for safety  Ambulation/Gait Ambulation/Gait assistance: Supervision Ambulation Distance (Feet): 200 Feet Assistive device: Rolling walker (2 wheeled) Gait Pattern/deviations: Step-through pattern;Decreased stride length;Decreased weight shift to left;Decreased stance time - left Gait velocity: decreased   General Gait Details: cues for posture and increased L quad activation during stance phase   Stairs Stairs: Yes   Stair Management: Two rails;Step to pattern;Forwards Number of Stairs: 2 General stair comments: cues for sequencing and technique  Wheelchair Mobility    Modified Rankin (Stroke Patients Only)       Balance Overall balance assessment: Needs  assistance Sitting-balance support: Feet supported Sitting balance-Leahy Scale: Good     Standing balance support: During functional activity;Bilateral upper extremity supported Standing balance-Leahy Scale: Fair Standing balance comment: Pt able to initially stand without RW for support                            Cognition Arousal/Alertness: Awake/alert Behavior During Therapy: WFL for tasks assessed/performed Overall Cognitive Status: Within Functional Limits for tasks assessed                                 General Comments: Pt reports that she is feeling drowsy due to pain medication. Pt still willing to particiapte in therapy and very pleasant.       Exercises      General Comments General comments (skin integrity, edema, etc.): daughter in law present throughout session; reviewed HEP frequency, activity progression, and use of ice      Pertinent Vitals/Pain Pain Assessment: Faces Faces Pain Scale: Hurts a little bit Pain Location: L knee Pain Descriptors / Indicators: Sore Pain Intervention(s): Monitored during session;Premedicated before session;Repositioned    Home Living Family/patient expects to be discharged to:: Private residence Living Arrangements: Alone Available Help at Discharge: Family;Available 24 hours/day Type of Home: House Home Access: Stairs to enter Entrance Stairs-Rails: Right;Left Home Layout: One level Home Equipment: Grab bars - tub/shower;Walker - 2 wheels;Bedside commode;Shower seat - built in      Prior Function Level of Independence: Independent          PT Goals (current goals can now be found in the care plan section)  Acute Rehab PT Goals Patient Stated Goal: return home tomorrow PT Goal Formulation: With patient/family Time For Goal Achievement: 07/08/16 Potential to Achieve Goals: Good Progress towards PT goals: Progressing toward goals    Frequency    7X/week      PT Plan Current plan  remains appropriate    Co-evaluation              AM-PAC PT "6 Clicks" Daily Activity  Outcome Measure  Difficulty turning over in bed (including adjusting bedclothes, sheets and blankets)?: A Little Difficulty moving from lying on back to sitting on the side of the bed? : A Little Difficulty sitting down on and standing up from a chair with arms (e.g., wheelchair, bedside commode, etc,.)?: Total Help needed moving to and from a bed to chair (including a wheelchair)?: A Little Help needed walking in hospital room?: None Help needed climbing 3-5 steps with a railing? : A Little 6 Click Score: 17    End of Session Equipment Utilized During Treatment: Gait belt Activity Tolerance: Patient tolerated treatment well Patient left: in chair;with call bell/phone within reach;with family/visitor present Nurse Communication: Mobility status PT Visit Diagnosis: Other abnormalities of gait and mobility (R26.89);Pain Pain - Right/Left: Left Pain - part of body: Knee     Time: 1610-9604 PT Time Calculation (min) (ACUTE ONLY): 16 min  Charges:  $Gait Training: 8-22 mins                    G Codes:       Erline Levine, PTA Pager: [phone]     Carolynne Edouard 06/25/2016, 12:34 PM

## 2016-06-25 NOTE — Evaluation (Signed)
Occupational Therapy Evaluation Patient Details Name: Sandy Hodges MRN: [id number] DOB: 07-17-1945 Today's Date: 06/25/2016    History of Present Illness Pt is a 72 y/o female s/p L TKA. PMH including but not limited to R TKA in 2017, HTN and fibromyalgia.   Clinical Impression   PTA, pt was living alone and was independent; pt reports that family will be staying with her at dc. Currently, pt requires supervision-Min guard A for ADLs and functional mobility using RW. Provided education on LB ADLs, toilet transfer, and tub transfer with shower seat; pt demonstrated understanding. Answered all pt questions. Recommend dc home once medically stable per physician. All acute OT needs met and will sign off. Thank you.    Follow Up Recommendations  DC plan and follow up therapy as arranged by surgeon;No OT follow up;Supervision/Assistance - 24 hour    Equipment Recommendations  None recommended by OT    Recommendations for Other Services PT consult     Precautions / Restrictions Precautions Precautions: Knee;Fall Precaution Comments: PT reviewed positioning of LE following TKA surgery with pt and pt's family. Restrictions Weight Bearing Restrictions: Yes (Simultaneous filing. User may not have seen previous data.) LLE Weight Bearing: Weight bearing as tolerated (Simultaneous filing. User may not have seen previous data.)      Mobility Bed Mobility Overal bed mobility: Needs Assistance Bed Mobility: Supine to Sit     Supine to sit: Supervision;HOB elevated     General bed mobility comments: increased time; supervision for safety  Transfers Overall transfer level: Needs assistance Equipment used: Rolling walker (2 wheeled) Transfers: Sit to/from Stand Sit to Stand: Min guard         General transfer comment: VCs for hand placement during descent    Balance Overall balance assessment: Needs assistance Sitting-balance support: Feet supported Sitting balance-Leahy Scale:  Good     Standing balance support: During functional activity;Bilateral upper extremity supported Standing balance-Leahy Scale: Fair Standing balance comment: Pt able to initially stand without RW for support                           ADL either performed or assessed with clinical judgement   ADL Overall ADL's : Needs assistance/impaired                       Lower Body Dressing Details (indicate cue type and reason): Educated on LB dressing techniques         Tub/ Shower Transfer: Walk-in shower;Shower seat;Ambulation;Cueing for sequencing;Rolling walker Tub/Shower Transfer Details (indicate cue type and reason): Pt dmeonstrated udnerstanding of shower transfer. She required Min guard for safety. Daughter-in-law also confirms understanding of transfer techqniue Functional mobility during ADLs: Min guard;Rolling walker General ADL Comments: Pt performing ADLs and fucntional mobility at supervision-Min guard level.  Pt and daughter-in-law report that pt completes toileting, sponge bath at sink, and dressing with supervision from daughter-in-law, but required no physical A.      Vision         Perception     Praxis      Pertinent Vitals/Pain Pain Assessment: Faces Faces Pain Scale: Hurts a little bit Pain Location: L knee Pain Descriptors / Indicators: Sore Pain Intervention(s): Monitored during session;Repositioned     Hand Dominance Right   Extremity/Trunk Assessment Upper Extremity Assessment Upper Extremity Assessment: Overall WFL for tasks assessed   Lower Extremity Assessment Lower Extremity Assessment: Defer to PT evaluation LLE Deficits / Details:  Pt with decreased strength and ROM limitations secondary to post-op. Sensation diminished to light touch on L heel.   Cervical / Trunk Assessment Cervical / Trunk Assessment: Normal   Communication Communication Communication: No difficulties   Cognition Arousal/Alertness:  Awake/alert;Lethargic;Suspect due to medications Behavior During Therapy: [facility] for tasks assessed/performed Overall Cognitive Status: Within Functional Limits for tasks assessed                                 General Comments: Pt reports that she is feeling drowsy due to pain medication. Pt still willing to particiapte in therapy and very pleasant.    General Comments  Daughter-in-law present for session    Exercises     Shoulder Instructions      Home Living Family/patient expects to be discharged to:: Private residence Living Arrangements: Alone Available Help at Discharge: Family;Available 24 hours/day Type of Home: House Home Access: Stairs to enter [employer] of Steps: 3 Entrance Stairs-Rails: Right;Left Home Layout: One level     Bathroom Shower/Tub: Occupational psychologist: Standard     Home Equipment: Grab bars - tub/shower;Walker - 2 wheels;Bedside commode;Shower seat - built in          Prior Functioning/Environment Level of Independence: Independent                 OT Problem List: Decreased strength;Decreased range of motion;Decreased activity tolerance;Impaired balance (sitting and/or standing);Decreased safety awareness;Decreased knowledge of use of DME or AE;Decreased knowledge of precautions;Pain      OT Treatment/Interventions:      OT Goals(Current goals can be found in the care plan section) Acute Rehab OT Goals Patient Stated Goal: return home tomorrow OT Goal Formulation: With patient Time For Goal Achievement: 07/09/16 Potential to Achieve Goals: Good  OT Frequency:     Barriers to D/C:            Co-evaluation              AM-PAC PT "6 Clicks" Daily Activity     Outcome Measure Help from another person eating meals?: None Help from another person taking care of personal grooming?: A Little Help from another person toileting, which includes using toliet, bedpan, or urinal?: A Little Help  from another person bathing (including washing, rinsing, drying)?: A Little Help from another person to put on and taking off regular upper body clothing?: A Little Help from another person to put on and taking off regular lower body clothing?: None 6 Click Score: 20   End of Session Equipment Utilized During Treatment: Rolling walker CPM Left Knee CPM Left Knee: On Left Knee Flexion (Degrees): 60 Left Knee Extension (Degrees): 0 Nurse Communication: Mobility status;Weight bearing status  Activity Tolerance: Patient tolerated treatment well Patient left: in bed;with call bell/phone within reach;with family/visitor present  OT Visit Diagnosis: Unsteadiness on feet (R26.81);Other abnormalities of gait and mobility (R26.89);Pain Pain - Right/Left: Left Pain - part of body: Knee                Time: 8828-0034 OT Time Calculation (min): 14 min Charges:  OT General Charges $OT Visit: 1 Procedure OT Evaluation $OT Eval Low Complexity: 1 Procedure G-Codes:     Marquel Spoto MSOT, OTR/L Acute Rehab Pager: [phone] Office: [place] 06/25/2016, 9:56 AM

## 2016-06-27 ENCOUNTER — Encounter (HOSPITAL_COMMUNITY): Payer: Self-pay | Admitting: Orthopedic Surgery

## 2016-07-08 NOTE — Progress Notes (Signed)
PT Progress Note Addendum for G-Codes    06/24/16 1601  PT G-Codes **NOT FOR INPATIENT CLASS**  Functional Assessment Tool Used AM-PAC 6 Clicks Basic Mobility;Clinical judgement  Functional Limitation Mobility: Walking and moving around  Mobility: Walking and Moving Around Current Status [phone](G8978) CK  Mobility: Walking and Moving Around Goal Status [phone]([phone]) [facility]   Sandy ChalkJennifer Margeaux Hodges, [place]PT, DPT [phone]

## 2016-07-09 NOTE — Progress Notes (Signed)
OT G-Code Later Entry    06/25/16 0900  OT Time Calculation  OT Start Time (ACUTE ONLY) 0933  OT Stop Time (ACUTE ONLY) 0947  OT Time Calculation (min) 14 min  OT G-codes **NOT FOR INPATIENT CLASS**  Functional Assessment Tool Used Clinical judgement  Functional Limitation Self care  Self Care Current Status (Z6109(G8987) CI  Self Care Goal Status (U0454(G8988) CI  Self Care Discharge Status (U9811(G8989) CI   Sandy Hodges MSOT, OTR/L Acute Rehab Pager: [phone] Office: [phone]

## 2016-07-10 ENCOUNTER — Emergency Department (HOSPITAL_COMMUNITY): Payer: Medicare Other

## 2016-07-10 ENCOUNTER — Emergency Department (HOSPITAL_COMMUNITY)
Admission: EM | Admit: 2016-07-10 | Discharge: 2016-07-10 | Disposition: A | Discharge status: Home / Self Care | Payer: Medicare Other | Attending: Emergency Medicine | Admitting: Emergency Medicine

## 2016-07-10 ENCOUNTER — Encounter (HOSPITAL_COMMUNITY): Payer: Self-pay | Admitting: Emergency Medicine

## 2016-07-10 DIAGNOSIS — Z7982 Long term (current) use of aspirin: Secondary | ICD-10-CM | POA: Diagnosis not present

## 2016-07-10 DIAGNOSIS — Z96653 Presence of artificial knee joint, bilateral: Secondary | ICD-10-CM | POA: Insufficient documentation

## 2016-07-10 DIAGNOSIS — R1084 Generalized abdominal pain: Secondary | ICD-10-CM | POA: Diagnosis present

## 2016-07-10 DIAGNOSIS — I10 Essential (primary) hypertension: Secondary | ICD-10-CM | POA: Insufficient documentation

## 2016-07-10 DIAGNOSIS — T50905A Adverse effect of unspecified drugs, medicaments and biological substances, initial encounter: Secondary | ICD-10-CM

## 2016-07-10 DIAGNOSIS — K2900 Acute gastritis without bleeding: Secondary | ICD-10-CM | POA: Diagnosis not present

## 2016-07-10 DIAGNOSIS — T507X5A Adverse effect of analeptics and opioid receptor antagonists, initial encounter: Secondary | ICD-10-CM | POA: Diagnosis not present

## 2016-07-10 DIAGNOSIS — Z79899 Other long term (current) drug therapy: Secondary | ICD-10-CM | POA: Diagnosis not present

## 2016-07-10 LAB — LIPASE, BLOOD: Lipase: 43 U/L (ref 11–51)

## 2016-07-10 LAB — COMPREHENSIVE METABOLIC PANEL
ALBUMIN: 4.1 g/dL (ref 3.5–5.0)
ALK PHOS: 31 U/L — AB (ref 38–126)
ALT: 13 U/L — ABNORMAL LOW (ref 14–54)
AST: 19 U/L (ref 15–41)
Anion gap: 8 (ref 5–15)
BUN: 22 mg/dL — AB (ref 6–20)
CALCIUM: 10 mg/dL (ref 8.9–10.3)
CO2: 25 mmol/L (ref 22–32)
Chloride: 102 mmol/L (ref 101–111)
Creatinine, Ser: 1.09 mg/dL — ABNORMAL HIGH (ref 0.44–1.00)
GFR calc Af Amer: 57 mL/min — ABNORMAL LOW (ref >60)
GFR calc non Af Amer: 49 mL/min — ABNORMAL LOW (ref >60)
GLUCOSE: 113 mg/dL — AB (ref 65–99)
Potassium: 4.5 mmol/L (ref 3.5–5.1)
SODIUM: 135 mmol/L (ref 135–145)
Total Bilirubin: 0.6 mg/dL (ref 0.3–1.2)
Total Protein: 6.8 g/dL (ref 6.5–8.1)

## 2016-07-10 LAB — URINALYSIS, ROUTINE W REFLEX MICROSCOPIC
BILIRUBIN URINE: NEGATIVE
Glucose, UA: NEGATIVE mg/dL
HGB URINE DIPSTICK: NEGATIVE
KETONES UR: NEGATIVE mg/dL
Leukocytes, UA: NEGATIVE
Nitrite: NEGATIVE
Protein, ur: NEGATIVE mg/dL
SPECIFIC GRAVITY, URINE: 1.012 (ref 1.005–1.030)
pH: 5 (ref 5.0–8.0)

## 2016-07-10 LAB — CBC
HCT: 32.6 % — ABNORMAL LOW (ref 36.0–46.0)
Hemoglobin: 10.6 g/dL — ABNORMAL LOW (ref 12.0–15.0)
MCH: 31 pg (ref 26.0–34.0)
MCHC: 32.5 g/dL (ref 30.0–36.0)
MCV: 95.3 fL (ref 78.0–100.0)
Platelets: 234 10*3/uL (ref 150–400)
RBC: 3.42 MIL/uL — ABNORMAL LOW (ref 3.87–5.11)
RDW: 14.3 % (ref 11.5–15.5)
WBC: 3.3 10*3/uL — ABNORMAL LOW (ref 4.0–10.5)

## 2016-07-10 LAB — I-STAT TROPONIN, ED: TROPONIN I, POC: 0.01 ng/mL (ref 0.00–0.08)

## 2016-07-10 MED ORDER — IOPAMIDOL (ISOVUE-300) INJECTION 61%
100.0000 mL | Freq: Once | INTRAVENOUS | Status: AC | PRN
  Administered 2016-07-10: 100 mL via INTRAVENOUS

## 2016-07-10 MED ORDER — GI COCKTAIL ~~LOC~~
30.0000 mL | Freq: Once | ORAL | Status: AC
  Administered 2016-07-10: 30 mL via ORAL
  Filled 2016-07-10: qty 30

## 2016-07-10 MED ORDER — OMEPRAZOLE 20 MG PO CPDR: 20.0000 mg | DELAYED_RELEASE_CAPSULE | Freq: Every day | ORAL | 0 refills | Status: DC

## 2016-07-10 MED ORDER — SUCRALFATE 1 GM/10ML PO SUSP: 1.0000 g | Freq: Three times a day (TID) | ORAL | 0 refills | Status: DC

## 2016-07-10 MED ORDER — SODIUM CHLORIDE 0.9 % IV BOLUS (SEPSIS)
1000.0000 mL | Freq: Once | INTRAVENOUS | Status: AC
  Administered 2016-07-10: 1000 mL via INTRAVENOUS

## 2016-07-10 MED ORDER — IOPAMIDOL (ISOVUE-300) INJECTION 61%
INTRAVENOUS | Status: AC
  Filled 2016-07-10: qty 100

## 2016-07-10 MED ORDER — FENTANYL CITRATE (PF) 100 MCG/2ML IJ SOLN
50.0000 ug | Freq: Once | INTRAMUSCULAR | Status: AC
  Administered 2016-07-10: 50 ug via INTRAVENOUS
  Filled 2016-07-10: qty 2

## 2016-07-10 MED ORDER — ONDANSETRON HCL 4 MG/2ML IJ SOLN
4.0000 mg | Freq: Once | INTRAMUSCULAR | Status: AC
  Administered 2016-07-10: 4 mg via INTRAVENOUS
  Filled 2016-07-10: qty 2

## 2016-07-10 NOTE — ED Notes (Signed)
Pt able to tolerate PO without vomiting. 

## 2016-07-10 NOTE — ED Provider Notes (Signed)
[facility]-EMERGENCY DEPT Provider Note   CSN: [id number] Arrival date & time: 07/10/16  1435     History   Chief Complaint Chief Complaint  Patient presents with  . Abdominal Pain  . Psychiatric Evaluation    HPI Sandy Hodges is a 71 y.o. female.  HPI   71  Yo F with PMHx as below including recent L knee replacement 3 weeks ago here with diffuse abd pain. Pt states that since her surgery she has been regularly taking percocet for pain. She has a h/o constipation and has been taking stool softeners as well. She has had intermittent, mild stomach upset when taking her meds over this time period but over the past 2-3 days, has had progressively worsening aching, gnawing, epigastric and LUQ pain that is worse when taking her oxycodone as well as eating. She has associated mild nausea and poor appetite. She is having small, solid BM but less than usual. No vomiting. No fever or chills. No other med changes. No h/o PUD. No alleviating factors.  Past Medical History:  Diagnosis Date  . Abdominal pain, unspecified site 07/09/2013  . Anxiety   . Arthritis 11/14/2013  . Back pain 07/09/2013  . Blood dyscrasia    Pancytopenial; followed at [facility] - Dr. Bertis Ruddy  . Depression 10/16/2014  . Essential hypertension 11/14/2013  . Fatigue 07/09/2013  . Fibromyalgia   . Hyperlipidemia   . Hypertension   . Knee pain 07/09/2013  . OA (osteoarthritis) of knee   . Pancytopenia (HCC) 12/14/2011  . Pancytopenia (HCC)   . PONV (postoperative nausea and vomiting)    nausea an vomiting  . Unspecified deficiency anemia 07/09/2013    Patient Active Problem List   Diagnosis Date Noted  . Primary localized osteoarthritis of left knee 06/24/2016  . Primary localized osteoarthritis of right knee 10/16/2015  . Depression 10/16/2014  . Arthritis 11/14/2013  . Essential hypertension 11/14/2013  . Preventive measure 11/14/2013  . Uterine mass 08/01/2013  . Deficiency anemia 07/09/2013  . Fatigue 07/09/2013    . Knee pain 07/09/2013  . Back pain 07/09/2013  . Abdominal pain, unspecified site 07/09/2013  . Leukopenia 07/09/2013  . Flank pain 07/09/2013  . Pancytopenia (HCC) 12/14/2011    Past Surgical History:  Procedure Laterality Date  . COLONOSCOPY    . JOINT REPLACEMENT    . 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 ROTATOR CUFF REPAIR Right 09/2014  . TOTAL KNEE ARTHROPLASTY Right 10/16/2015   Procedure: TOTAL KNEE ARTHROPLASTY;  Surgeon: Frederico Hamman, MD;  Location: [facility] OR;  Service: Orthopedics;  Laterality: Right;  . TOTAL KNEE ARTHROPLASTY Left 06/24/2016   Procedure: LEFT TOTAL KNEE ARTHROPLASTY;  Surgeon: Frederico Hamman, MD;  Location: [facility] OR;  Service: Orthopedics;  Laterality: Left;    OB History    No data available       Home Medications    Prior to Admission medications   Medication Sig Start Date End Date Taking? Authorizing Provider  acetaminophen (TYLENOL) 500 MG tablet Take 1,000 mg by mouth 2 (two) times daily as needed (for pain.).   Yes [provider]  ALPRAZolam Prudy Feeler) 1 MG tablet Take 0.5-1 mg by mouth 2 (two) times daily. 0.5 mg daily and 1 mg at bedtime. 09/14/15  Yes [provider]  aspirin EC 81 MG tablet Take 81 mg by mouth daily.   Yes [provider]  atenolol (TENORMIN) 25 MG tablet Take 50 mg by mouth every evening.  05/16/16  Yes [provider]  Calcium Carbonate-Vit D-Min (CALCIUM 600+D PLUS MINERALS PO) Take 1 tablet by mouth 2 (two) times daily.   Yes [provider]  Cholecalciferol (VITAMIN D-3) 1000 UNITS CAPS Take 1,000 Units by mouth daily.    Yes [provider]  docusate sodium (COLACE) 100 MG capsule Take 1 capsule (100 mg total) by mouth 2 (two) times daily. 06/25/16  Yes Shepperson, Kirstin, PA-C  fenofibrate (TRICOR) 145 MG tablet Take 145 mg by mouth every evening.  12/07/11  Yes [provider]  fish oil-omega-3 fatty acids 1000 MG capsule Take 2 g by mouth 2  (two) times daily.    Yes [provider]  ibuprofen (ADVIL,MOTRIN) 200 MG tablet Take 400 mg by mouth every 6 (six) hours as needed for moderate pain.   Yes [provider]  lisinopril-hydrochlorothiazide (PRINZIDE,ZESTORETIC) 20-12.5 MG per tablet Take 1 tablet by mouth every evening.  04/28/14  Yes [provider]  loratadine (CLARITIN) 10 MG tablet Take 10 mg by mouth 2 (two) times daily.    Yes [provider]  Multiple Vitamin (MULTIVITAMIN WITH MINERALS) TABS tablet Take 1 tablet by mouth every evening. One-A-Day Women 50+   Yes [provider]  ondansetron (ZOFRAN) 4 MG tablet Take 1 tablet (4 mg total) by mouth every 8 (eight) hours as needed for nausea or vomiting. 06/24/16  Yes Chadwell, Ivin Booty, PA-C  oxyCODONE-acetaminophen (ROXICET) 5-325 MG tablet Take 1-2 tablets by mouth every 4 (four) hours as needed. 06/24/16 06/24/17 Yes Chadwell, Ivin Booty, PA-C  Probiotic Product (PROBIOTIC PO) Take 1 capsule by mouth every evening.   Yes [provider]  simvastatin (ZOCOR) 20 MG tablet Take 20 mg by mouth every evening.  12/07/11  Yes [provider]  apixaban (ELIQUIS) 2.5 MG TABS tablet Take 1 tablet (2.5 mg total) by mouth 2 (two) times daily. 06/24/16   Chadwell, Ivin Booty, PA-C  ferrous sulfate 325 (65 FE) MG tablet Take 325 mg by mouth See admin instructions. Take 2-3 times weekly due to side effects    [provider]  omeprazole (PRILOSEC OTC) 20 MG tablet Take 20 mg by mouth daily as needed (with diclofenac use for ulcer/reflux prevention).    [provider]  omeprazole (PRILOSEC) 20 MG capsule Take 1 capsule (20 mg total) by mouth daily. 07/10/16 07/24/16  Shaune Pollack, MD  sucralfate (CARAFATE) 1 GM/10ML suspension Take 10 mLs (1 g total) by mouth 4 (four) times daily -  with meals and at bedtime. 07/10/16   Shaune Pollack, MD    Family History Family History  Problem Relation Age of Onset  . Cancer Mother         breast ca  . Hypertension Father   . CVA Father     Social History Social History  Substance Use Topics  . Smoking status: Never Smoker  . Smokeless tobacco: Never Used  . Alcohol use No     Allergies   No known allergies   Review of Systems Review of Systems  Constitutional: Positive for appetite change and fatigue. Negative for fever.  Gastrointestinal: Positive for abdominal distention, abdominal pain, constipation and nausea.  All other systems reviewed and are negative.    Physical Exam Updated Vital Signs BP (!) 159/62 (BP Location: Left Arm)   Pulse 74   Temp 98.2 F (36.8 C) (Oral)   Resp 18   SpO2 100%   Physical Exam  Constitutional: She is oriented to person, place, and time.  She appears well-developed and well-nourished. No distress.  HENT:  Head: Normocephalic and atraumatic.  Eyes: Conjunctivae are normal.  Neck: Neck supple.  Cardiovascular: Normal rate, regular rhythm and normal heart sounds.  Exam reveals no friction rub.   No murmur heard. Pulmonary/Chest: Effort normal and breath sounds normal. No respiratory distress. She has no wheezes. She has no rales.  Abdominal: Soft. Normal appearance. She exhibits no distension. Bowel sounds are decreased. There is tenderness in the epigastric area and left upper quadrant. There is no rigidity, no rebound, no guarding and no CVA tenderness.  Musculoskeletal: She exhibits no edema.  Neurological: She is alert and oriented to person, place, and time. She exhibits normal muscle tone.  Skin: Skin is warm. Capillary refill takes less than 2 seconds.  Psychiatric: She has a normal mood and affect.  Nursing note and vitals reviewed.    ED Treatments / Results  Labs (all labs ordered are listed, but only abnormal results are displayed) Labs Reviewed  COMPREHENSIVE METABOLIC PANEL - Abnormal; Notable for the following:       Result Value   Glucose, Bld 113 (*)    BUN 22 (*)    Creatinine, Ser 1.09 (*)     ALT 13 (*)    Alkaline Phosphatase 31 (*)    GFR calc non Af Amer 49 (*)    GFR calc Af Amer 57 (*)    All other components within normal limits  CBC - Abnormal; Notable for the following:    WBC 3.3 (*)    RBC 3.42 (*)    Hemoglobin 10.6 (*)    HCT 32.6 (*)    All other components within normal limits  LIPASE, BLOOD  URINALYSIS, ROUTINE W REFLEX MICROSCOPIC  I-STAT TROPOININ, ED    EKG  EKG Interpretation None       Radiology Ct Abdomen Pelvis W Contrast  Result Date: 07/10/2016 CLINICAL DATA:  c/o of left sided abdominal pain that began about 4 days ago. Pt states she has upper left sided abdominal pain. Pt states she has had excess gas, and LBM was yesterday. Pt states she has been taking oxycodone for a knee surgery on 6/8. Patient also c/o constipation. EXAM: CT ABDOMEN AND PELVIS WITH CONTRAST TECHNIQUE: Multidetector CT imaging of the abdomen and pelvis was performed using the standard protocol following bolus administration of intravenous contrast. CONTRAST:  ISOVUE-300 IOPAMIDOL (ISOVUE-300) INJECTION 61% COMPARISON:  07/10/2013 FINDINGS: Lower chest: No acute abnormality. Hepatobiliary: No focal liver abnormality is seen. No gallstones, gallbladder wall thickening, or biliary dilatation.Wall Pancreas: Unremarkable. No pancreatic ductal dilatation or surrounding inflammatory changes. Spleen: Spleen is normal in size. 17 mm low-density lesion arises from the inferior aspect of the spleen, likely cysts and stable from the prior exam. No other splenic abnormality. Adrenals/Urinary Tract: No adrenal masses. Bilateral renal sinus cysts. Bilateral renal cortical thinning. Small midpole left renal cortical cyst. Stable appearance from the prior study. No hydronephrosis. Ureters normal course and caliber. Bladder is unremarkable. Stomach/Bowel: Stomach and small bowel unremarkable. There are scattered left colon diverticula mostly along the sigmoid. No diverticulitis. No other colon  abnormality. No significant increase in colonic stool. Normal appendix visualized. Vascular/Lymphatic: Aortic atherosclerosis. No enlarged abdominal or pelvic lymph nodes. Reproductive: 2.1 cm uterine fundal fibroid. Uterus otherwise unremarkable. No adnexal masses. Other: No abdominal wall hernia or abnormality. No abdominopelvic ascites. Musculoskeletal: No fracture or acute finding. No osteoblastic or osteolytic lesions. IMPRESSION: 1. No acute findings. No findings to account for the  patient's symptoms. 2. No significant increase in stool. 3. Stable renal sinus cysts. 4. Scattered left colon diverticula without diverticulitis. 5. Aortic atherosclerosis. Electronically Signed   By: Amie Portlandavid  Ormond M.D.   On: 07/10/2016 17:24    Procedures Procedures (including critical care time)  Medications Ordered in ED Medications  iopamidol (ISOVUE-300) 61 % injection (not administered)  sodium chloride 0.9 % bolus 1,000 mL (1,000 mLs Intravenous New Bag/Given 07/10/16 1637)  gi cocktail (Maalox,Lidocaine,Donnatal) (30 mLs Oral Given 07/10/16 1637)  ondansetron (ZOFRAN) injection 4 mg (4 mg Intravenous Given 07/10/16 1637)  fentaNYL (SUBLIMAZE) injection 50 mcg (50 mcg Intravenous Given 07/10/16 1637)  iopamidol (ISOVUE-300) 61 % injection 100 mL (100 mLs Intravenous Contrast Given 07/10/16 1702)     Initial Impression / Assessment and Plan / ED Course  I have reviewed the triage vital signs and the nursing notes.  Pertinent labs & imaging results that were available during my care of the patient were reviewed by me and considered in my medical decision making (see chart for details).     71 yo F with PMHx as above here with abdominal discomfort in setting of taking oxycodone for recent knee surgery. On arrival, VSS and WNL. Lab work unremarkable/at baseline. She has mild leukopenia and anemia which is atbaseline. CMP c/w mild dehydration and pt has been given IVF. UA without UTI or ketones. Trop neg, EKG  non ischemic and do not suspect referred pain from ACS. CT scan obtained given age and is negative. Pt feels improved with GI cocktail.  I suspect pt's sx are 2/2 nausea/gastritis from pain medication use. Will give omeprazole, carafate. She has antiemetics at home. Tolerating PO here without difficulty. No other acute emergent pathology identified.   Final Clinical Impressions(s) / ED Diagnoses   Final diagnoses:  Acute gastritis without hemorrhage, unspecified gastritis type  Adverse effect of drug, initial encounter    New Prescriptions New Prescriptions   OMEPRAZOLE (PRILOSEC) 20 MG CAPSULE    Take 1 capsule (20 mg total) by mouth daily.   SUCRALFATE (CARAFATE) 1 GM/10ML SUSPENSION    Take 10 mLs (1 g total) by mouth 4 (four) times daily -  with meals and at bedtime.     Shaune PollackIsaacs, Mahamud Metts, MD 07/10/16 [id number]

## 2016-07-10 NOTE — ED Notes (Signed)
Pt family requesting Psych Eval.

## 2016-07-10 NOTE — ED Notes (Signed)
Pt transported to CT ?

## 2016-07-10 NOTE — ED Triage Notes (Signed)
Pt from home with c/o of left sided abdominal pain that began about 4 days ago. Pt states she has upper left sided abdominal pain. Pt states she has had excess gas, and LBM was yesterday. Pt states she has been taking oxycodone for a knee surgery on 6/8. Pt denies n/v/d and denies urinary symptoms.

## 2016-07-12 ENCOUNTER — Encounter (HOSPITAL_COMMUNITY): Payer: Self-pay | Admitting: *Deleted

## 2016-07-12 DIAGNOSIS — Z79899 Other long term (current) drug therapy: Secondary | ICD-10-CM | POA: Diagnosis not present

## 2016-07-12 DIAGNOSIS — M170 Bilateral primary osteoarthritis of knee: Secondary | ICD-10-CM | POA: Diagnosis not present

## 2016-07-12 DIAGNOSIS — F4329 Adjustment disorder with other symptoms: Secondary | ICD-10-CM | POA: Insufficient documentation

## 2016-07-12 DIAGNOSIS — K219 Gastro-esophageal reflux disease without esophagitis: Secondary | ICD-10-CM | POA: Insufficient documentation

## 2016-07-12 DIAGNOSIS — Z7982 Long term (current) use of aspirin: Secondary | ICD-10-CM | POA: Insufficient documentation

## 2016-07-12 DIAGNOSIS — M797 Fibromyalgia: Secondary | ICD-10-CM | POA: Insufficient documentation

## 2016-07-12 DIAGNOSIS — F418 Other specified anxiety disorders: Secondary | ICD-10-CM | POA: Diagnosis not present

## 2016-07-12 DIAGNOSIS — I10 Essential (primary) hypertension: Secondary | ICD-10-CM | POA: Insufficient documentation

## 2016-07-12 DIAGNOSIS — E785 Hyperlipidemia, unspecified: Secondary | ICD-10-CM | POA: Diagnosis not present

## 2016-07-12 DIAGNOSIS — D61818 Other pancytopenia: Secondary | ICD-10-CM | POA: Insufficient documentation

## 2016-07-12 DIAGNOSIS — I700 Atherosclerosis of aorta: Secondary | ICD-10-CM | POA: Diagnosis not present

## 2016-07-12 DIAGNOSIS — F332 Major depressive disorder, recurrent severe without psychotic features: Secondary | ICD-10-CM | POA: Diagnosis not present

## 2016-07-12 DIAGNOSIS — F419 Anxiety disorder, unspecified: Secondary | ICD-10-CM | POA: Diagnosis present

## 2016-07-12 DIAGNOSIS — R1013 Epigastric pain: Secondary | ICD-10-CM | POA: Diagnosis present

## 2016-07-12 LAB — COMPREHENSIVE METABOLIC PANEL
ALT: 13 U/L — AB (ref 14–54)
AST: 19 U/L (ref 15–41)
Albumin: 4 g/dL (ref 3.5–5.0)
Alkaline Phosphatase: 33 U/L — ABNORMAL LOW (ref 38–126)
Anion gap: 9 (ref 5–15)
BUN: 15 mg/dL (ref 6–20)
CHLORIDE: 99 mmol/L — AB (ref 101–111)
CO2: 24 mmol/L (ref 22–32)
CREATININE: 0.9 mg/dL (ref 0.44–1.00)
Calcium: 9.8 mg/dL (ref 8.9–10.3)
GFR calc Af Amer: >60 mL/min (ref >60)
GFR calc non Af Amer: >60 mL/min (ref >60)
Glucose, Bld: 107 mg/dL — ABNORMAL HIGH (ref 65–99)
Potassium: 4.2 mmol/L (ref 3.5–5.1)
SODIUM: 132 mmol/L — AB (ref 135–145)
Total Bilirubin: 0.7 mg/dL (ref 0.3–1.2)
Total Protein: 6.7 g/dL (ref 6.5–8.1)

## 2016-07-12 LAB — CBC
HCT: 32.2 % — ABNORMAL LOW (ref 36.0–46.0)
Hemoglobin: 10.6 g/dL — ABNORMAL LOW (ref 12.0–15.0)
MCH: 31 pg (ref 26.0–34.0)
MCHC: 32.9 g/dL (ref 30.0–36.0)
MCV: 94.2 fL (ref 78.0–100.0)
PLATELETS: 233 10*3/uL (ref 150–400)
RBC: 3.42 MIL/uL — ABNORMAL LOW (ref 3.87–5.11)
RDW: 14 % (ref 11.5–15.5)
WBC: 3 10*3/uL — ABNORMAL LOW (ref 4.0–10.5)

## 2016-07-12 LAB — LIPASE, BLOOD: LIPASE: 57 U/L — AB (ref 11–51)

## 2016-07-12 NOTE — ED Triage Notes (Signed)
Pt reports upper abd pain since Sunday, was seen here in the ED then for same.  Was told it was gastritis.  Pt reports seeing her PCP yesterday for same as well and was also told that it could be gastritis.  Pt reports her anxiety is making it worse.  Pt reports having knee replacement on the 8th of this month and has been taking oxycodone for the pain,  Pt believes that the oxycodone is what's causing her pain, states her PCP told her that it can "tear my stomach up."  She reports that her family members are sick of her because she states they think that she is just doing this to get attention.  Pt reports she cannot leave in pain all the time.  Pt reports LBM was today which is loose not hard.

## 2016-07-13 ENCOUNTER — Emergency Department (HOSPITAL_COMMUNITY): Payer: Medicare Other

## 2016-07-13 ENCOUNTER — Encounter (HOSPITAL_COMMUNITY): Payer: Self-pay

## 2016-07-13 ENCOUNTER — Observation Stay (HOSPITAL_COMMUNITY)
Admission: EM | Admit: 2016-07-13 | Discharge: 2016-07-15 | Disposition: A | Discharge status: Home / Self Care | Payer: Medicare Other | Attending: Family Medicine | Admitting: Family Medicine

## 2016-07-13 DIAGNOSIS — F32A Depression, unspecified: Secondary | ICD-10-CM

## 2016-07-13 DIAGNOSIS — M1990 Unspecified osteoarthritis, unspecified site: Secondary | ICD-10-CM | POA: Diagnosis not present

## 2016-07-13 DIAGNOSIS — F419 Anxiety disorder, unspecified: Secondary | ICD-10-CM | POA: Diagnosis not present

## 2016-07-13 DIAGNOSIS — F332 Major depressive disorder, recurrent severe without psychotic features: Secondary | ICD-10-CM | POA: Diagnosis present

## 2016-07-13 DIAGNOSIS — F329 Major depressive disorder, single episode, unspecified: Secondary | ICD-10-CM

## 2016-07-13 DIAGNOSIS — R1013 Epigastric pain: Secondary | ICD-10-CM | POA: Diagnosis not present

## 2016-07-13 DIAGNOSIS — R109 Unspecified abdominal pain: Secondary | ICD-10-CM | POA: Diagnosis present

## 2016-07-13 DIAGNOSIS — I10 Essential (primary) hypertension: Secondary | ICD-10-CM | POA: Diagnosis not present

## 2016-07-13 DIAGNOSIS — F4381 Prolonged grief disorder: Secondary | ICD-10-CM | POA: Diagnosis present

## 2016-07-13 DIAGNOSIS — R110 Nausea: Secondary | ICD-10-CM

## 2016-07-13 DIAGNOSIS — F4329 Adjustment disorder with other symptoms: Secondary | ICD-10-CM | POA: Diagnosis present

## 2016-07-13 DIAGNOSIS — E7800 Pure hypercholesterolemia, unspecified: Secondary | ICD-10-CM

## 2016-07-13 LAB — I-STAT TROPONIN, ED: Troponin i, poc: 0 ng/mL (ref 0.00–0.08)

## 2016-07-13 LAB — CBC
HCT: 33.2 % — ABNORMAL LOW (ref 36.0–46.0)
Hemoglobin: 10.8 g/dL — ABNORMAL LOW (ref 12.0–15.0)
MCH: 30.5 pg (ref 26.0–34.0)
MCHC: 32.5 g/dL (ref 30.0–36.0)
MCV: 93.8 fL (ref 78.0–100.0)
PLATELETS: 211 10*3/uL (ref 150–400)
RBC: 3.54 MIL/uL — AB (ref 3.87–5.11)
RDW: 14.3 % (ref 11.5–15.5)
WBC: 2.5 10*3/uL — ABNORMAL LOW (ref 4.0–10.5)

## 2016-07-13 LAB — URINALYSIS, ROUTINE W REFLEX MICROSCOPIC
BILIRUBIN URINE: NEGATIVE
GLUCOSE, UA: NEGATIVE mg/dL
Hgb urine dipstick: NEGATIVE
KETONES UR: NEGATIVE mg/dL
NITRITE: NEGATIVE
PH: 8 (ref 5.0–8.0)
Protein, ur: NEGATIVE mg/dL
SPECIFIC GRAVITY, URINE: 1.004 — AB (ref 1.005–1.030)
Squamous Epithelial / LPF: NONE SEEN

## 2016-07-13 LAB — CREATININE, SERUM
CREATININE: 0.82 mg/dL (ref 0.44–1.00)
GFR calc Af Amer: >60 mL/min (ref >60)
GFR calc non Af Amer: >60 mL/min (ref >60)

## 2016-07-13 LAB — TROPONIN I
Troponin I: <0.03 ng/mL (ref <0.03)
Troponin I: <0.03 ng/mL (ref <0.03)

## 2016-07-13 MED ORDER — VITAMIN D3 25 MCG (1000 UNIT) PO TABS
1000.0000 [IU] | ORAL_TABLET | Freq: Every day | ORAL | Status: DC
  Administered 2016-07-13 – 2016-07-15 (×3): 1000 [IU] via ORAL
  Filled 2016-07-13 (×3): qty 1

## 2016-07-13 MED ORDER — PANTOPRAZOLE SODIUM 40 MG PO TBEC
40.0000 mg | DELAYED_RELEASE_TABLET | Freq: Two times a day (BID) | ORAL | Status: DC
Start: 2016-07-13 — End: 2016-07-15
  Administered 2016-07-13 – 2016-07-15 (×5): 40 mg via ORAL
  Filled 2016-07-13 (×5): qty 1

## 2016-07-13 MED ORDER — PANTOPRAZOLE SODIUM 40 MG PO TBEC: 40.0000 mg | DELAYED_RELEASE_TABLET | Freq: Every day | ORAL | Status: DC

## 2016-07-13 MED ORDER — APIXABAN 2.5 MG PO TABS
2.5000 mg | ORAL_TABLET | Freq: Two times a day (BID) | ORAL | Status: DC
  Administered 2016-07-13 (×2): 2.5 mg via ORAL
  Filled 2016-07-13 (×2): qty 1

## 2016-07-13 MED ORDER — LISINOPRIL-HYDROCHLOROTHIAZIDE 20-12.5 MG PO TABS: 1.0000 | ORAL_TABLET | Freq: Every evening | ORAL | Status: DC

## 2016-07-13 MED ORDER — ATENOLOL 50 MG PO TABS
50.0000 mg | ORAL_TABLET | Freq: Every evening | ORAL | Status: DC
  Administered 2016-07-13 – 2016-07-15 (×3): 50 mg via ORAL
  Filled 2016-07-13 (×3): qty 1

## 2016-07-13 MED ORDER — ALPRAZOLAM 1 MG PO TABS
1.0000 mg | ORAL_TABLET | Freq: Every day | ORAL | Status: DC
  Administered 2016-07-13: 1 mg via ORAL
  Filled 2016-07-13: qty 1

## 2016-07-13 MED ORDER — POLYETHYLENE GLYCOL 3350 17 G PO PACK
17.0000 g | PACK | Freq: Every day | ORAL | Status: DC
  Administered 2016-07-13: 17 g via ORAL
  Filled 2016-07-13: qty 1

## 2016-07-13 MED ORDER — LORAZEPAM 2 MG/ML IJ SOLN
0.5000 mg | Freq: Once | INTRAMUSCULAR | Status: AC
  Administered 2016-07-13: 0.5 mg via INTRAVENOUS
  Filled 2016-07-13: qty 1

## 2016-07-13 MED ORDER — SODIUM CHLORIDE 0.9 % IV BOLUS (SEPSIS)
1000.0000 mL | Freq: Once | INTRAVENOUS | Status: AC
  Administered 2016-07-13: 1000 mL via INTRAVENOUS

## 2016-07-13 MED ORDER — ALPRAZOLAM 0.5 MG PO TABS: 0.5000 mg | ORAL_TABLET | Freq: Two times a day (BID) | ORAL | Status: DC

## 2016-07-13 MED ORDER — ALPRAZOLAM 0.5 MG PO TABS
0.5000 mg | ORAL_TABLET | Freq: Every day | ORAL | Status: DC
  Administered 2016-07-13 – 2016-07-14 (×2): 0.5 mg via ORAL
  Filled 2016-07-13 (×2): qty 1

## 2016-07-13 MED ORDER — SERTRALINE HCL 25 MG PO TABS
25.0000 mg | ORAL_TABLET | Freq: Every day | ORAL | Status: DC
  Administered 2016-07-13 – 2016-07-14 (×2): 25 mg via ORAL
  Filled 2016-07-13 (×2): qty 1

## 2016-07-13 MED ORDER — FERROUS SULFATE 325 (65 FE) MG PO TABS
325.0000 mg | ORAL_TABLET | ORAL | Status: DC
  Administered 2016-07-13 – 2016-07-15 (×2): 325 mg via ORAL
  Filled 2016-07-13 (×2): qty 1

## 2016-07-13 MED ORDER — HEPARIN SODIUM (PORCINE) 5000 UNIT/ML IJ SOLN: 5000.0000 [IU] | Freq: Three times a day (TID) | INTRAMUSCULAR | Status: DC

## 2016-07-13 MED ORDER — LISINOPRIL 20 MG PO TABS
20.0000 mg | ORAL_TABLET | Freq: Every day | ORAL | Status: DC
  Administered 2016-07-13 – 2016-07-15 (×3): 20 mg via ORAL
  Filled 2016-07-13 (×3): qty 1

## 2016-07-13 MED ORDER — ONDANSETRON HCL 4 MG PO TABS
4.0000 mg | ORAL_TABLET | Freq: Three times a day (TID) | ORAL | Status: DC | PRN
  Administered 2016-07-13: 4 mg via ORAL
  Filled 2016-07-13: qty 1

## 2016-07-13 MED ORDER — CALCIUM CARBONATE-VITAMIN D 500-200 MG-UNIT PO TABS
ORAL_TABLET | Freq: Two times a day (BID) | ORAL | Status: DC
  Administered 2016-07-13 – 2016-07-15 (×5): 1 via ORAL
  Filled 2016-07-13 (×5): qty 1

## 2016-07-13 MED ORDER — OXYCODONE-ACETAMINOPHEN 5-325 MG PO TABS
1.0000 | ORAL_TABLET | ORAL | Status: DC | PRN
  Administered 2016-07-13: 2 via ORAL
  Filled 2016-07-13: qty 2

## 2016-07-13 MED ORDER — DOCUSATE SODIUM 100 MG PO CAPS
100.0000 mg | ORAL_CAPSULE | Freq: Two times a day (BID) | ORAL | Status: DC
  Administered 2016-07-13 – 2016-07-14 (×4): 100 mg via ORAL
  Filled 2016-07-13 (×5): qty 1

## 2016-07-13 MED ORDER — LORATADINE 10 MG PO TABS
10.0000 mg | ORAL_TABLET | Freq: Two times a day (BID) | ORAL | Status: DC
  Administered 2016-07-13 – 2016-07-15 (×5): 10 mg via ORAL
  Filled 2016-07-13 (×5): qty 1

## 2016-07-13 MED ORDER — SUCRALFATE 1 GM/10ML PO SUSP
1.0000 g | Freq: Three times a day (TID) | ORAL | Status: DC
  Administered 2016-07-13 – 2016-07-15 (×9): 1 g via ORAL
  Filled 2016-07-13 (×9): qty 10

## 2016-07-13 MED ORDER — SIMVASTATIN 20 MG PO TABS
20.0000 mg | ORAL_TABLET | Freq: Every evening | ORAL | Status: DC
  Administered 2016-07-13 – 2016-07-15 (×3): 20 mg via ORAL
  Filled 2016-07-13 (×3): qty 1

## 2016-07-13 MED ORDER — ALPRAZOLAM 0.5 MG PO TABS
0.5000 mg | ORAL_TABLET | Freq: Once | ORAL | Status: AC
  Administered 2016-07-13: 0.5 mg via ORAL
  Filled 2016-07-13: qty 1

## 2016-07-13 MED ORDER — ASPIRIN EC 81 MG PO TBEC
81.0000 mg | DELAYED_RELEASE_TABLET | Freq: Every day | ORAL | Status: DC
  Administered 2016-07-13 – 2016-07-15 (×3): 81 mg via ORAL
  Filled 2016-07-13 (×3): qty 1

## 2016-07-13 MED ORDER — HYDROCHLOROTHIAZIDE 12.5 MG PO CAPS
12.5000 mg | ORAL_CAPSULE | Freq: Every day | ORAL | Status: DC
  Administered 2016-07-13 – 2016-07-15 (×3): 12.5 mg via ORAL
  Filled 2016-07-13 (×3): qty 1

## 2016-07-13 MED ORDER — METOCLOPRAMIDE HCL 5 MG/ML IJ SOLN
5.0000 mg | Freq: Three times a day (TID) | INTRAMUSCULAR | Status: DC | PRN
  Administered 2016-07-13: 5 mg via INTRAVENOUS
  Filled 2016-07-13: qty 2

## 2016-07-13 MED ORDER — OMEGA-3-ACID ETHYL ESTERS 1 G PO CAPS
2.0000 g | ORAL_CAPSULE | Freq: Two times a day (BID) | ORAL | Status: DC
  Administered 2016-07-13 – 2016-07-15 (×5): 2 g via ORAL
  Filled 2016-07-13 (×5): qty 2

## 2016-07-13 MED ORDER — ONDANSETRON HCL 4 MG/2ML IJ SOLN
4.0000 mg | Freq: Once | INTRAMUSCULAR | Status: AC
  Administered 2016-07-13: 4 mg via INTRAVENOUS
  Filled 2016-07-13: qty 2

## 2016-07-13 MED ORDER — ACETAMINOPHEN 500 MG PO TABS
1000.0000 mg | ORAL_TABLET | Freq: Two times a day (BID) | ORAL | Status: DC | PRN
  Administered 2016-07-13 – 2016-07-15 (×4): 1000 mg via ORAL
  Filled 2016-07-13 (×5): qty 2

## 2016-07-13 MED ORDER — ADULT MULTIVITAMIN W/MINERALS CH
1.0000 | ORAL_TABLET | Freq: Every evening | ORAL | Status: DC
  Administered 2016-07-13 – 2016-07-15 (×3): 1 via ORAL
  Filled 2016-07-13 (×3): qty 1

## 2016-07-13 NOTE — Care Management Note (Signed)
Case Management Note  Patient Details  Name: Sandy Hodges MRN: [id number] Date of Birth: Feb 20, 1945  Subjective/Objective: 71 y/o f admitted w/Abdominal pain. From home.                   Action/Plan:d/c plan home.   Expected Discharge Date:  07/14/16               Expected Discharge Plan:  Home/Self Care  In-House Referral:     Discharge planning Services  CM Consult  Post Acute Care Choice:    Choice offered to:     DME Arranged:    DME Agency:     HH Arranged:    HH Agency:     Status of Service:  In process, will continue to follow  If discussed at Long Length of Stay Meetings, dates discussed:    Additional Comments:  Lanier ClamMahabir, Kaesha Kirsch, RN 07/13/2016, 11:57 AM

## 2016-07-13 NOTE — Care Management Obs Status (Signed)
MEDICARE OBSERVATION STATUS NOTIFICATION   Patient Details  Name: Sandy OrisBetty R Singleton MRN: [id number] Date of Birth: 1945/03/17   Medicare Observation Status Notification Given:  Yes    MahabirOlegario Messier, Jago Carton, RN 07/13/2016, 11:57 AM

## 2016-07-13 NOTE — Consult Note (Signed)
Referring Provider: Dr. Nelson Chimes Primary Care Physician:  [name], MD Primary Gastroenterologist:  Dr. Madilyn Fireman  Reason for Consultation:  Epigastric abdominal pain  HPI: Sandy Hodges is a 71 y.o. female with past medical history of total knee replacement on 06/24/2016, history of chronic pancytopenia and fibromyalgia came into the ED yesterday with complaint of abdominal pain.  Patient seen and examined at bedside. Patient complaining of left upper quadrant/left lower repeat pain with radiation towards left upper flank. She did have some substernal burning sensation but denied any epigastric pain to me. She is complaining of constipation while she was on oxycodone. Last bowel movement was yesterday. She had some nausea but denied any vomiting. She denied any fever or chills. Denied any blood in the stool. Her stools are dark because she is taking iron supplements. Denied dysphagia or odynophagia. Her symptoms are improving and she is feeling better today  Had EGD and colonoscopy in 2013 by Dr. Madilyn Fireman for evaluation of iron deficiency anemia - both endoscopies were normal.   Past Medical History:  Diagnosis Date  . Abdominal pain, unspecified site 07/09/2013  . Anxiety   . Arthritis 11/14/2013  . Back pain 07/09/2013  . Blood dyscrasia    Pancytopenial; followed at [facility] - Dr. Bertis Ruddy  . Depression 10/16/2014  . Essential hypertension 11/14/2013  . Fatigue 07/09/2013  . Fibromyalgia   . Hyperlipidemia   . Hypertension   . Knee pain 07/09/2013  . OA (osteoarthritis) of knee   . Pancytopenia (HCC) 12/14/2011  . Pancytopenia (HCC)   . PONV (postoperative nausea and vomiting)    nausea an vomiting  . Unspecified deficiency anemia 07/09/2013    Past Surgical History:  Procedure Laterality Date  . COLONOSCOPY    . JOINT REPLACEMENT    . 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 ROTATOR CUFF REPAIR Right 09/2014  . TOTAL KNEE ARTHROPLASTY Right 10/16/2015   Procedure:  TOTAL KNEE ARTHROPLASTY;  Surgeon: Frederico Hamman, MD;  Location: [facility] OR;  Service: Orthopedics;  Laterality: Right;  . TOTAL KNEE ARTHROPLASTY Left 06/24/2016   Procedure: LEFT TOTAL KNEE ARTHROPLASTY;  Surgeon: Frederico Hamman, MD;  Location: [facility] OR;  Service: Orthopedics;  Laterality: Left;    Prior to Admission medications   Medication Sig Start Date End Date Taking? Authorizing Provider  acetaminophen (TYLENOL) 500 MG tablet Take 1,000 mg by mouth 2 (two) times daily as needed (for pain.).   Yes [provider]  ALPRAZolam Prudy Feeler) 1 MG tablet Take 0.5-1 mg by mouth 2 (two) times daily. 0.5 mg daily and 1 mg at bedtime. 09/14/15  Yes [provider]  apixaban (ELIQUIS) 2.5 MG TABS tablet Take 1 tablet (2.5 mg total) by mouth 2 (two) times daily. 06/24/16  Yes Chadwell, Glo Herring  aspirin EC 81 MG tablet Take 81 mg by mouth daily.   Yes [provider]  atenolol (TENORMIN) 25 MG tablet Take 50 mg by mouth every evening. 05/16/16  Yes [provider]  Calcium Carbonate-Vit D-Min (CALCIUM 600+D PLUS MINERALS PO) Take 1 tablet by mouth 2 (two) times daily.   Yes [provider]  Cholecalciferol (VITAMIN D-3) 1000 UNITS CAPS Take 1,000 Units by mouth daily.    Yes [provider]  docusate sodium (COLACE) 100 MG capsule Take 1 capsule (100 mg total) by mouth 2 (two) times daily. 06/25/16  Yes Shepperson, Kirstin, PA-C  fenofibrate (TRICOR) 145 MG tablet Take 145 mg by mouth every evening.  12/07/11  Yes [provider]  ferrous sulfate 325 (65 FE) MG tablet Take 325 mg by mouth See admin instructions. Take 2-3 times weekly due to side effects   Yes [provider]  fish oil-omega-3 fatty acids 1000 MG capsule Take 2 g by mouth 2 (two) times daily.    Yes [provider]  ibuprofen (ADVIL,MOTRIN) 200 MG tablet Take 400 mg by mouth every 6 (six) hours as needed for moderate pain.   Yes [provider]   lisinopril-hydrochlorothiazide (PRINZIDE,ZESTORETIC) 20-12.5 MG per tablet Take 1 tablet by mouth every evening.  04/28/14  Yes [provider]  loratadine (CLARITIN) 10 MG tablet Take 10 mg by mouth 2 (two) times daily.    Yes [provider]  Multiple Vitamin (MULTIVITAMIN WITH MINERALS) TABS tablet Take 1 tablet by mouth every evening. One-A-Day Women 50+   Yes [provider]  omeprazole (PRILOSEC) 20 MG capsule Take 1 capsule (20 mg total) by mouth daily. 07/10/16 07/24/16 Yes Shaune Pollack, MD  ondansetron (ZOFRAN) 4 MG tablet Take 1 tablet (4 mg total) by mouth every 8 (eight) hours as needed for nausea or vomiting. 06/24/16  Yes Chadwell, Ivin Booty, PA-C  oxyCODONE-acetaminophen (ROXICET) 5-325 MG tablet Take 1-2 tablets by mouth every 4 (four) hours as needed. Patient taking differently: Take 1-2 tablets by mouth every 4 (four) hours as needed for severe pain.  06/24/16 06/24/17 Yes Chadwell, Ivin Booty, PA-C  Probiotic Product (PROBIOTIC PO) Take 1 capsule by mouth every evening.   Yes [provider]  sertraline (ZOLOFT) 50 MG tablet Take 25 mg by mouth daily. 07/11/16  Yes [provider]  simvastatin (ZOCOR) 20 MG tablet Take 20 mg by mouth every evening.  12/07/11  Yes [provider]  sucralfate (CARAFATE) 1 GM/10ML suspension Take 10 mLs (1 g total) by mouth 4 (four) times daily -  with meals and at bedtime. 07/10/16  Yes Shaune Pollack, MD    Scheduled Meds: . ALPRAZolam  0.5-1 mg Oral BID  . apixaban  2.5 mg Oral BID  . aspirin EC  81 mg Oral Daily  . atenolol  50 mg Oral QPM  . calcium-vitamin D   Oral BID  . cholecalciferol  1,000 Units Oral Daily  . docusate sodium  100 mg Oral BID  . ferrous sulfate  325 mg Oral Q M,W,F  . heparin  5,000 Units Subcutaneous Q8H  . lisinopril-hydrochlorothiazide  1 tablet Oral QPM  . loratadine  10 mg Oral BID  . multivitamin with minerals  1 tablet Oral QPM  . omega-3 acid ethyl esters  2 g Oral  BID  . pantoprazole  40 mg Oral Daily  . sertraline  25 mg Oral Daily  . simvastatin  20 mg Oral QPM  . sucralfate  1 g Oral TID WC & HS   Continuous Infusions: PRN Meds:.acetaminophen, ondansetron, oxyCODONE-acetaminophen  Allergies as of 07/12/2016 - Review Complete 07/12/2016  Allergen Reaction Noted  . No known allergies  06/23/2016    Family History  Problem Relation Age of Onset  . Cancer Mother        breast ca  . Hypertension Father   . CVA Father     Social History   Social History  . Marital status: Widowed    Spouse name: N/A  . Number of children: N/A  . Years of education: N/A   Occupational History  . Not on file.   Social History Main Topics  . Smoking status: Never  Smoker  . Smokeless tobacco: Never Used  . Alcohol use No  . Drug use: No  . Sexual activity: Not on file   Other Topics Concern  . Not on file   Social History Narrative  . No narrative on file    Review of Systems: Review of Systems  Constitutional: Negative for chills, fever, malaise/fatigue and weight loss.  HENT: Negative for hearing loss and tinnitus.   Eyes: Negative for blurred vision and double vision.  Respiratory: Negative for cough, hemoptysis and sputum production.   Cardiovascular: Negative for chest pain and palpitations.  Gastrointestinal: Positive for abdominal pain, constipation, heartburn and nausea. Negative for blood in stool and vomiting.  Genitourinary: Negative for dysuria and urgency.  Musculoskeletal: Positive for joint pain. Negative for falls and myalgias.  Skin: Negative for rash.  Neurological: Negative for focal weakness, seizures and loss of consciousness.  Endo/Heme/Allergies: Does not bruise/bleed easily.  Psychiatric/Behavioral: Negative for hallucinations and suicidal ideas.    Physical Exam: Vital signs: Vitals:   07/13/16 0800 07/13/16 0841  BP: (!) 141/77 (!) 158/90  Pulse: 99 (!) 107  Resp: 20 20  Temp:  99.1 F (37.3 C)   Last  BM Date: 07/12/16 Physical Exam  Constitutional: She is oriented to person, place, and time. She appears well-developed and well-nourished. No distress.  HENT:  Head: Normocephalic and atraumatic.  Mouth/Throat: Oropharynx is clear and moist. No oropharyngeal exudate.  Eyes: EOM are normal. No scleral icterus.  Neck: Normal range of motion. Neck supple. No thyromegaly present.  Cardiovascular: Normal rate, regular rhythm and normal heart sounds.   Pulmonary/Chest: Effort normal and breath sounds normal. No respiratory distress.  Abdominal: Soft. Bowel sounds are normal. She exhibits no distension. There is no rebound and no guarding.  Patient with left lower rib / left upper quadrant discomfort on palpation  Musculoskeletal: She exhibits no edema or tenderness.  Neurological: She is alert and oriented to person, place, and time.  Skin: Skin is warm. No erythema.  Psychiatric: She has a normal mood and affect. Her behavior is normal. Thought content normal.  Vitals reviewed.  GI:  Lab Results:  Recent Labs  07/10/16 1538 07/12/16 2008  WBC 3.3* 3.0*  HGB 10.6* 10.6*  HCT 32.6* 32.2*  PLT 234 233   BMET  Recent Labs  07/10/16 1538 07/12/16 2008  NA 135 132*  K 4.5 4.2  CL 102 99*  CO2 25 24  GLUCOSE 113* 107*  BUN 22* 15  CREATININE 1.09* 0.90  CALCIUM 10.0 9.8   LFT  Recent Labs  07/12/16 2008  PROT 6.7  ALBUMIN 4.0  AST 19  ALT 13*  ALKPHOS 33*  BILITOT 0.7   PT/INR No results for input(s): LABPROT, INR in the last 72 hours.   Studies/Results: Dg Abdomen Acute W/chest  Result Date: 07/13/2016 CLINICAL DATA:  Abdominal pain for several weeks, worsened over the past few days. EXAM: DG ABDOMEN ACUTE W/ 1V CHEST COMPARISON:  CT 07/10/2016 FINDINGS: There is no evidence of dilated bowel loops or free intraperitoneal air. No radiopaque calculi or other significant radiographic abnormality is seen. Heart size and mediastinal contours are within normal limits.  Both lungs are clear. IMPRESSION: Negative abdominal radiographs.  No acute cardiopulmonary disease. Electronically Signed   By: Ellery Plunkaniel R Mitchell M.D.   On: 07/13/2016 05:04    Impression/Plan: - Left upper quadrant pain. Improving. Unclear etiology. Recent CT on 07/10/2016 showed no acute changes. - GERD. Improving. - Mild elevated lipase. Nonspecific. CT  scan showed no evidence of pancreatitis. - Acute on chronic anemia. - Constipation. Resolved.  Recommendations ------------------------- - Patient's left upper quadrant pain is improving. No evidence of acute etiology on the CT scan. She had some substernal burning, which is also resolved now.  she denied epigastric pain to me. If she starts having worsening symptoms may consider EGD and/or repeating CT scan for further evaluation. Continue Carafate. Increase PPI to twice a day for now. According to patient and her symptoms were associated with taking oxycodone. - Recommend MiraLAX as needed for constipation. - She was advised to follow-up with Dr. Madilyn Fireman after discharge for follow-up of anemia.    LOS: 0 days   Kathi Der  MD, FACP 07/13/2016, 9:30 AM  Pager [phone] If no answer or after 5 PM call [phone]

## 2016-07-13 NOTE — Progress Notes (Signed)
Chaplain was happy to be paged by patient's nurse to make a visit.  Patient has expressed being depressed. Patient talks about losing her husband after a long illness and losing her sister and sister-in-law.  Patient expresses that all of her grandchildren and children are Christians.  Patient says she feels blessed but also gets depressed when she thinks too much about loss.  Chaplain provided prayer with patient and a safe space for patient to reflect and make some meaning of all of the things that have happened in her life. Brother in room with patient.   Chaplain would like to thank patients nurse and care team for compassionate care given to this patient.    07/13/16 1631  Clinical Encounter Type  Visited With Patient;Family  Visit Type Initial;Psychological support;Spiritual support;Social support  Referral From Nurse  Consult/Referral To Chaplain  Spiritual Encounters  Spiritual Needs Prayer;Grief support;Emotional  Stress Factors  Patient Stress Factors Loss;Health changes  Family Stress Factors Health changes

## 2016-07-13 NOTE — H&P (Signed)
History and Physical    Sandy OrisBetty R Hodges Hodges:[id number]:[id number] DOB: 18-Jun-1945 DOA: 07/13/2016  PCP: Mila PalmerWolters, Sharon, MD Patient coming from: Home  Chief Complaint: Abd Pain   HPI: Sandy Hodges is a 71 y.o. female with medical history significant of osteoarthritis, hypertension, hyperlipidemia, chronic pancytopenia, fibromyalgia, depression, anxiety him to the ER with the complaints of epigastric abdominal pain. Patient states she had a total knee replacement on 06/24/2016 and since she left from here she's been having epigastric abdominal pain which she describes as constant sharp and at times it's worse to a point where it's 8/10 in intensity. Due to his pain her appetite has been very poor. Denies any association of this pain with food. Denies nausea, vomiting, fevers and chills. At times does admit of constipation as she is on narcotics but she also takes stool softener. Her last bowel movement was yesterday. She used to use NSAIDs prior to her knee surgery but since then she's been mostly using oxycodone. She had an endoscopy back in 2013 which showed hiatal hernia otherwise unremarkable, biopsy for duodenal area was negative for any malignancy. She admits of losing about 3-4 pounds in last month as she has not been able to eat and drink. She does take iron supplement every day for chronic pancytopenia. She had come to the ED on 07/10/2016 with similar complaints and at that time her LFTs, lipase were unremarkable. CT of the abdomen pelvis at the time was also normal. Today in the ER, her labs again are pretty much unremarkable but continues to have this abdominal pain. Abdominal x-ray is unremarkable. She was given half a milligram of IV Ativan, Zofran 4 mg IV and 1 L normal saline. At the time of my examination she still reported of constant epigastric abdominal pain about 5/10 in intensity. No nausea at this time. I spoke with Dr. Madilyn FiremanHayes from gastroenterology who will evaluate her later  today.   Review of Systems: As per HPI otherwise 10 point review of systems negative.   Past Medical History:  Diagnosis Date  . Abdominal pain, unspecified site 07/09/2013  . Anxiety   . Arthritis 11/14/2013  . Back pain 07/09/2013  . Blood dyscrasia    Pancytopenial; [facility] Dr. Bertis RuddyGorsuch  . Depression 10/16/2014  . Essential hypertension 11/14/2013  . Fatigue 07/09/2013  . Fibromyalgia   . Hyperlipidemia   . Hypertension   . Knee pain 07/09/2013  . OA (osteoarthritis) of knee   . Pancytopenia (HCC) 12/14/2011  . Pancytopenia (HCC)   . PONV (postoperative nausea and vomiting)    nausea an vomiting  . Unspecified deficiency anemia 07/09/2013    Past Surgical History:  Procedure Laterality Date  . COLONOSCOPY    . JOINT REPLACEMENT    . 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 ROTATOR CUFF REPAIR Right 09/2014  . TOTAL KNEE ARTHROPLASTY Right 10/16/2015   Procedure: TOTAL KNEE ARTHROPLASTY;  Surgeon: Frederico Hammananiel Caffrey, MD;  [facility];  Service: Orthopedics;  Laterality: Right;  . TOTAL KNEE ARTHROPLASTY Left 06/24/2016   Procedure: LEFT TOTAL KNEE ARTHROPLASTY;  Surgeon: Frederico Hammanaffrey, Daniel, MD;  [facility];  Service: Orthopedics;  Laterality: Left;     reports that she has never smoked. She has never used smokeless tobacco. She reports that she does not drink alcohol or use drugs.  Allergies  Allergen Reactions  . No Known Allergies     Family History  Problem Relation Age of Onset  .  Cancer Mother        breast ca  . Hypertension Father   . CVA Father     Acceptable: Family history reviewed and not pertinent (If you reviewed it)  Prior to Admission medications   Medication Sig Start Date End Date Taking? Authorizing Provider  acetaminophen (TYLENOL) 500 MG tablet Take 1,000 mg by mouth 2 (two) times daily as needed (for pain.).   Yes [provider]  ALPRAZolam Prudy Feeler) 1 MG tablet Take 0.5-1 mg by mouth 2 (two) times daily. 0.5  mg daily and 1 mg at bedtime. 09/14/15  Yes [provider]  apixaban (ELIQUIS) 2.5 MG TABS tablet Take 1 tablet (2.5 mg total) by mouth 2 (two) times daily. 06/24/16  Yes Chadwell, Glo Herring  aspirin EC 81 MG tablet Take 81 mg by mouth daily.   Yes [provider]  atenolol (TENORMIN) 25 MG tablet Take 50 mg by mouth every evening. 05/16/16  Yes [provider]  Calcium Carbonate-Vit D-Min (CALCIUM 600+D PLUS MINERALS PO) Take 1 tablet by mouth 2 (two) times daily.   Yes [provider]  Cholecalciferol (VITAMIN D-3) 1000 UNITS CAPS Take 1,000 Units by mouth daily.    Yes [provider]  docusate sodium (COLACE) 100 MG capsule Take 1 capsule (100 mg total) by mouth 2 (two) times daily. 06/25/16  Yes Shepperson, Kirstin, PA-C  fenofibrate (TRICOR) 145 MG tablet Take 145 mg by mouth every evening.  12/07/11  Yes [provider]  ferrous sulfate 325 (65 FE) MG tablet Take 325 mg by mouth See admin instructions. Take 2-3 times weekly due to side effects   Yes [provider]  fish oil-omega-3 fatty acids 1000 MG capsule Take 2 g by mouth 2 (two) times daily.    Yes [provider]  ibuprofen (ADVIL,MOTRIN) 200 MG tablet Take 400 mg by mouth every 6 (six) hours as needed for moderate pain.   Yes [provider]  lisinopril-hydrochlorothiazide (PRINZIDE,ZESTORETIC) 20-12.5 MG per tablet Take 1 tablet by mouth every evening.  04/28/14  Yes [provider]  loratadine (CLARITIN) 10 MG tablet Take 10 mg by mouth 2 (two) times daily.    Yes [provider]  Multiple Vitamin (MULTIVITAMIN WITH MINERALS) TABS tablet Take 1 tablet by mouth every evening. One-A-Day Women 50+   Yes [provider]  omeprazole (PRILOSEC) 20 MG capsule Take 1 capsule (20 mg total) by mouth daily. 07/10/16 07/24/16 Yes Shaune Pollack, MD  ondansetron (ZOFRAN) 4 MG tablet Take 1 tablet (4 mg total) by mouth every 8 (eight) hours as  needed for nausea or vomiting. 06/24/16  Yes Chadwell, Ivin Booty, PA-C  oxyCODONE-acetaminophen (ROXICET) 5-325 MG tablet Take 1-2 tablets by mouth every 4 (four) hours as needed. Patient taking differently: Take 1-2 tablets by mouth every 4 (four) hours as needed for severe pain.  06/24/16 06/24/17 Yes Chadwell, Ivin Booty, PA-C  Probiotic Product (PROBIOTIC PO) Take 1 capsule by mouth every evening.   Yes [provider]  sertraline (ZOLOFT) 50 MG tablet Take 25 mg by mouth daily. 07/11/16  Yes [provider]  simvastatin (ZOCOR) 20 MG tablet Take 20 mg by mouth every evening.  12/07/11  Yes [provider]  sucralfate (CARAFATE) 1 GM/10ML suspension Take 10 mLs (1 g total) by mouth 4 (four) times daily -  with meals and at bedtime. 07/10/16  Wilmon Pali, MD    Physical Exam: Vitals:   07/12/16 1935 07/13/16 0427 07/13/16 0634  BP: Marland Kitchen)  147/93 (!) 158/76 (!) 152/85  Pulse: 74 87 (!) 107  Resp:  18 (!) 22  Temp: 98.2 F (36.8 C)    TempSrc: Oral    SpO2: 100% 97% 97%      Constitutional: NAD, calm, comfortable Vitals:   07/12/16 1935 07/13/16 0427 07/13/16 0634  BP: (!) 147/93 (!) 158/76 (!) 152/85  Pulse: 74 87 (!) 107  Resp:  18 (!) 22  Temp: 98.2 F (36.8 C)    TempSrc: Oral    SpO2: 100% 97% 97%   Eyes: PERRL, lids and conjunctivae normal ENMT: Mucous membranes are moist. Posterior pharynx clear of any exudate or lesions.Normal dentition.  Neck: normal, supple, no masses, no thyromegaly Respiratory: clear to auscultation bilaterally, no wheezing, no crackles. Normal respiratory effort. No accessory muscle use.  Cardiovascular: Regular rate and rhythm, no murmurs / rubs / gallops. No extremity edema. 2+ pedal pulses. No carotid bruits.  Abdomen: no tenderness, no masses palpated. No hepatosplenomegaly. Bowel sounds positive.  Musculoskeletal: no clubbing / cyanosis. No joint deformity upper and lower extremities. Good ROM, no contractures. Normal muscle  tone.  Skin: no rashes, lesions, ulcers. No induration Neurologic: CN 2-12 grossly intact. Sensation intact, DTR normal. Strength 5/5 in all 4.  Psychiatric: Normal judgment and insight. Alert and oriented x 3. Normal mood.     Labs on Admission: I have personally reviewed following labs and imaging studies  CBC:  Recent Labs Lab 07/10/16 1538 07/12/16 2008  WBC 3.3* 3.0*  HGB 10.6* 10.6*  HCT 32.6* 32.2*  MCV 95.3 94.2  PLT 234 233   Basic Metabolic Panel:  Recent Labs Lab 07/10/16 1538 07/12/16 2008  NA 135 132*  K 4.5 4.2  CL 102 99*  CO2 25 24  GLUCOSE 113* 107*  BUN 22* 15  CREATININE 1.09* 0.90  CALCIUM 10.0 9.8   GFR: CrCl cannot be calculated (Unknown ideal weight.). Liver Function Tests:  Recent Labs Lab 07/10/16 1538 07/12/16 2008  AST 19 19  ALT 13* 13*  ALKPHOS 31* 33*  BILITOT 0.6 0.7  PROT 6.8 6.7  ALBUMIN 4.1 4.0    Recent Labs Lab 07/10/16 1538 07/12/16 2008  LIPASE 43 57*   No results for input(s): AMMONIA in the last 168 hours. Coagulation Profile: No results for input(s): INR, PROTIME in the last 168 hours. Cardiac Enzymes: No results for input(s): CKTOTAL, CKMB, CKMBINDEX, TROPONINI in the last 168 hours. BNP (last 3 results) No results for input(s): PROBNP in the last 8760 hours. HbA1C: No results for input(s): HGBA1C in the last 72 hours. CBG: No results for input(s): GLUCAP in the last 168 hours. Lipid Profile: No results for input(s): CHOL, HDL, LDLCALC, TRIG, CHOLHDL, LDLDIRECT in the last 72 hours. Thyroid Function Tests: No results for input(s): TSH, T4TOTAL, FREET4, T3FREE, THYROIDAB in the last 72 hours. Anemia Panel: No results for input(s): VITAMINB12, FOLATE, FERRITIN, TIBC, IRON, RETICCTPCT in the last 72 hours. Urine analysis:    Component Value Date/Time   COLORURINE STRAW (A) 07/13/2016 0559   APPEARANCEUR CLEAR 07/13/2016 0559   LABSPEC 1.004 (L) 07/13/2016 0559   LABSPEC 1.020 07/09/2013 1035    PHURINE 8.0 07/13/2016 0559   GLUCOSEU NEGATIVE 07/13/2016 0559   GLUCOSEU Negative 07/09/2013 1035   HGBUR NEGATIVE 07/13/2016 0559   BILIRUBINUR NEGATIVE 07/13/2016 0559   BILIRUBINUR Negative 07/09/2013 1035   KETONESUR NEGATIVE 07/13/2016 0559   PROTEINUR NEGATIVE 07/13/2016 0559   UROBILINOGEN 0.2 07/09/2013 1035   NITRITE NEGATIVE 07/13/2016 0559  LEUKOCYTESUR TRACE (A) 07/13/2016 0559   LEUKOCYTESUR Negative 07/09/2013 1035   Sepsis Labs: !!!!!!!!!!!!!!!!!!!!!!!!!!!!!!!!!!!!!!!!!!!! @LABRCNTIP (procalcitonin:4,lacticidven:4) )No results found for this or any previous visit (from the past 240 hour(s)).   Radiological Exams on Admission: Dg Abdomen Acute W/chest  Result Date: 07/13/2016 CLINICAL DATA:  Abdominal pain for several weeks, worsened over the past few days. EXAM: DG ABDOMEN ACUTE W/ 1V CHEST COMPARISON:  CT 07/10/2016 FINDINGS: There is no evidence of dilated bowel loops or free intraperitoneal air. No radiopaque calculi or other significant radiographic abnormality is seen. Heart size and mediastinal contours are within normal limits. Both lungs are clear. IMPRESSION: Negative abdominal radiographs.  No acute cardiopulmonary disease. Electronically Signed   By: Ellery Plunk M.D.   On: 07/13/2016 05:04    EKG: Independently reviewed.   Assessment/Plan Active Problems:   Abdominal pain   Persistent Abdominal Epigastric Pain  -unsure of the exact etiology, Ulcer vs Gastritis vs functional Dyspepsia? -CT A/P done 2 days ago was neg for acute pathology, AXR done today is neg as well  -Lipase is relatively WNL, LFTs unremarkable  -May have to increase her PPI to BID, Will consult GI as she may need an EGD and perhaps further work up  -EGD in 2013 showed - hiatal hernia otherwise normal; duodenal mucosal Bx normal. I dont see if she was tested for H Pylori. (Poor copy of the report scanned) -She has been on Iron tablet for sometime for hx of Chronic Pancytopenia  (seen Dr Bertis Ruddy in the past). Also had used NSAID's in the past due to her arthritis.  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 Avoid NSAIDs.  -bowel regimen with narcotics as needed . -cont PT/OT; follow up with ortho outpatient   HTN -on Atenolol; Lisinopril-HCTZ  HLD -cont Statin   Anemia of chronic Disease  -monitor Hb closely.   DVT prophylaxis: On Eliquis; post Op PPx Code Status: Full  Family Communication: None at bedside  Disposition Plan: TBD Consults called GI Admission status: Med Surg- Obs   Ankit Joline Maxcy MD [facility]   If 7PM-7AM, please contact night-coverage [email or web address] Password [facility]  07/13/2016, 7:40 AM

## 2016-07-13 NOTE — ED Notes (Signed)
PT REQUEST I-STAT TO BE PULLED FROM IV IF POSSIBLE

## 2016-07-13 NOTE — Progress Notes (Signed)
Patient has been c/o anxiety/depression constantly since admission.  She has had 4 or 5 crying spells.  During one of these she stated 'I feel like I'm having an anxiety attack".  Dr. Nelson ChimesAmin paged, order for 1 time dose of 0.5mg  of PO xanax given.  Chaplain called for patient.  Patient stated the chaplain visit helped.  Patient VSS stable at this time.  Will continue to monitor and provide support to patient.

## 2016-07-13 NOTE — ED Provider Notes (Signed)
[facility]-EMERGENCY DEPT Provider Note   CSN: [id number] Arrival date & time: 07/12/16  1909   By signing my name below, I, Clarisse Gouge, attest that this documentation has been prepared under the direction and in the presence of Horton, Mayer Masker, MD. Electronically signed, Clarisse Gouge, ED Scribe. 07/13/16. 4:13 AM.   History   Chief Complaint Chief Complaint  Patient presents with  . Abdominal Pain   The history is provided by the patient and medical records. No language interpreter was used.    Sandy Hodges is a 71 y.o. female with h/o anxiety and bilateral knee replacements BIB EMS from home to the Emergency Department concerning persistent, gradually worsening,epigastric pain x > 3-4 days. Pt believes her pain has gradually intensified since a total knee replacement performed 06/24/2016. Associated loose stools, nausea and decreased apptetite. Sharp, 10/10, stabbing pain radiating around to the mid back described. She states her pain is exacerbated with palpation, certain positions, eating and anxiety. Last BM yesterday, reportedly soft. Pt evaluated for similar symptoms in [facility] ED 3 days ago and advised that this abdominal pain could be d/t gastritis. Pt given a GI cocktail without relief during this evaluation. Family member states pt was placed on Prilosec and Carafate at this time. Reports no improvement.  Pt evaluated by PCP 2 days ago and told her pain may be d/t gastritis and that it may be related to stress. Pt started on Zoloft at this time. Fentanyl provided moderate relief during this evaluation. Pt on prescribed oxycodone following aforementioned total L knee replacement; she believes this medication may be worsening her pain. Multiple family members present on current evaluation believe pt's pain is related to anxiety. Family states the pt discontinued oxycodone 3 days ago and began taking ibuprofen and tylenol. H/o shingles noted; reports she had similar pain prior to rash  appearance of shingles. NL bowel movements noted recently; pt reports historic constipation. No h/o ulcers. No vomiting or fever. No other complaints at this time.   Of note, family state that patient symptoms seem to improve upon her evaluations but then she goes home and complains of worsening pain that brings her back for further evaluation.    Past Medical History:  Diagnosis Date  . Abdominal pain, unspecified site 07/09/2013  . Anxiety   . Arthritis 11/14/2013  . Back pain 07/09/2013  . Blood dyscrasia    Pancytopenial; followed at [facility] - Dr. Bertis Ruddy  . Depression 10/16/2014  . Essential hypertension 11/14/2013  . Fatigue 07/09/2013  . Fibromyalgia   . Hyperlipidemia   . Hypertension   . Knee pain 07/09/2013  . OA (osteoarthritis) of knee   . Pancytopenia (HCC) 12/14/2011  . Pancytopenia (HCC)   . PONV (postoperative nausea and vomiting)    nausea an vomiting  . Unspecified deficiency anemia 07/09/2013    Patient Active Problem List   Diagnosis Date Noted  . Abdominal pain 07/13/2016  . Primary localized osteoarthritis of left knee 06/24/2016  . Primary localized osteoarthritis of right knee 10/16/2015  . Depression 10/16/2014  . Arthritis 11/14/2013  . Essential hypertension 11/14/2013  . Preventive measure 11/14/2013  . Uterine mass 08/01/2013  . Deficiency anemia 07/09/2013  . Fatigue 07/09/2013  . Knee pain 07/09/2013  . Back pain 07/09/2013  . Abdominal pain, unspecified site 07/09/2013  . Leukopenia 07/09/2013  . Flank pain 07/09/2013  . Pancytopenia (HCC) 12/14/2011    Past Surgical History:  Procedure Laterality Date  . COLONOSCOPY    . JOINT REPLACEMENT    .  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 ROTATOR CUFF REPAIR Right 09/2014  . TOTAL KNEE ARTHROPLASTY Right 10/16/2015   Procedure: TOTAL KNEE ARTHROPLASTY;  Surgeon: Frederico Hamman, MD;  Location: [facility] OR;  Service: Orthopedics;  Laterality: Right;  . TOTAL KNEE ARTHROPLASTY Left  06/24/2016   Procedure: LEFT TOTAL KNEE ARTHROPLASTY;  Surgeon: Frederico Hamman, MD;  Location[facility] OR;  Service: Orthopedics;  Laterality: Left;    OB History    No data available       Home Medications    Prior to Admission medications   Medication Sig Start Date End Date Taking? Authorizing Provider  acetaminophen (TYLENOL) 500 MG tablet Take 1,000 mg by mouth 2 (two) times daily as needed (for pain.).   Yes [provider]  ALPRAZolam Prudy Feeler) 1 MG tablet Take 0.5-1 mg by mouth 2 (two) times daily. 0.5 mg daily and 1 mg at bedtime. 09/14/15  Yes [provider]  apixaban (ELIQUIS) 2.5 MG TABS tablet Take 1 tablet (2.5 mg total) by mouth 2 (two) times daily. 06/24/16  Yes Chadwell, Glo Herring  aspirin EC 81 MG tablet Take 81 mg by mouth daily.   Yes [provider]  atenolol (TENORMIN) 25 MG tablet Take 50 mg by mouth every evening. 05/16/16  Yes [provider]  Calcium Carbonate-Vit D-Min (CALCIUM 600+D PLUS MINERALS PO) Take 1 tablet by mouth 2 (two) times daily.   Yes [provider]  Cholecalciferol (VITAMIN D-3) 1000 UNITS CAPS Take 1,000 Units by mouth daily.    Yes [provider]  docusate sodium (COLACE) 100 MG capsule Take 1 capsule (100 mg total) by mouth 2 (two) times daily. 06/25/16  Yes Shepperson, Kirstin, PA-C  fenofibrate (TRICOR) 145 MG tablet Take 145 mg by mouth every evening.  12/07/11  Yes [provider]  ferrous sulfate 325 (65 FE) MG tablet Take 325 mg by mouth See admin instructions. Take 2-3 times weekly due to side effects   Yes [provider]  fish oil-omega-3 fatty acids 1000 MG capsule Take 2 g by mouth 2 (two) times daily.    Yes [provider]  ibuprofen (ADVIL,MOTRIN) 200 MG tablet Take 400 mg by mouth every 6 (six) hours as needed for moderate pain.   Yes [provider]  lisinopril-hydrochlorothiazide (PRINZIDE,ZESTORETIC) 20-12.5 MG per tablet Take 1 tablet by mouth  every evening.  04/28/14  Yes [provider]  loratadine (CLARITIN) 10 MG tablet Take 10 mg by mouth 2 (two) times daily.    Yes [provider]  Multiple Vitamin (MULTIVITAMIN WITH MINERALS) TABS tablet Take 1 tablet by mouth every evening. One-A-Day Women 50+   Yes [provider]  omeprazole (PRILOSEC) 20 MG capsule Take 1 capsule (20 mg total) by mouth daily. 07/10/16 07/24/16 Yes Shaune Pollack, MD  ondansetron (ZOFRAN) 4 MG tablet Take 1 tablet (4 mg total) by mouth every 8 (eight) hours as needed for nausea or vomiting. 06/24/16  Yes Chadwell, Ivin Booty, PA-C  oxyCODONE-acetaminophen (ROXICET) 5-325 MG tablet Take 1-2 tablets by mouth every 4 (four) hours as needed. Patient taking differently: Take 1-2 tablets by mouth every 4 (four) hours as needed for severe pain.  06/24/16 06/24/17 Yes Chadwell, Ivin Booty, PA-C  Probiotic Product (PROBIOTIC PO) Take 1 capsule by mouth every evening.   Yes [provider]  sertraline (ZOLOFT) 50 MG tablet Take 25 mg by mouth daily. 07/11/16  Yes [provider]  simvastatin (ZOCOR) 20 MG tablet  Take 20 mg by mouth every evening.  12/07/11  Yes [provider]  sucralfate (CARAFATE) 1 GM/10ML suspension Take 10 mLs (1 g total) by mouth 4 (four) times daily -  with meals and at bedtime. 07/10/16  Yes Shaune Pollack, MD    Family History Family History  Problem Relation Age of Onset  . Cancer Mother        breast ca  . Hypertension Father   . CVA Father     Social History Social History  Substance Use Topics  . Smoking status: Never Smoker  . Smokeless tobacco: Never Used  . Alcohol use No     Allergies   No known allergies   Review of Systems Review of Systems  Constitutional: Positive for appetite change. Negative for fever.  Respiratory: Negative for shortness of breath.   Cardiovascular: Negative for chest pain.  Gastrointestinal: Positive for abdominal pain and nausea. Negative for vomiting.    Skin: Negative for rash.  Psychiatric/Behavioral: The patient is nervous/anxious.   All other systems reviewed and are negative.    Physical Exam Updated Vital Signs BP (!) 147/93 (BP Location: Right Arm)   Pulse 74   Temp 98.2 F (36.8 C) (Oral)   SpO2 100%   Physical Exam  Constitutional: She is oriented to person, place, and time. She appears well-developed and well-nourished.  Tearful, no acute distress  HENT:  Head: Normocephalic and atraumatic.  Cardiovascular: Normal rate, regular rhythm and normal heart sounds.   Pulmonary/Chest: Effort normal and breath sounds normal. No respiratory distress. She has no wheezes.  Abdominal: Soft. Bowel sounds are normal. There is tenderness. There is no rebound and no guarding.  Epigastric tenderness palpation without rebound or guarding  Musculoskeletal:  Incision on the clean dry and intact  Neurological: She is alert and oriented to person, place, and time.  Skin: Skin is warm and dry.  Psychiatric: She has a normal mood and affect.  Tearful  Nursing note and vitals reviewed.    ED Treatments / Results  DIAGNOSTIC STUDIES: Oxygen Saturation is 100% on RA, NL by my interpretation.    COORDINATION OF CARE: 4:06 AM-Discussed next steps with pt. Pt verbalized understanding and is agreeable with the plan. Will pursue pain management.  Labs (all labs ordered are listed, but only abnormal results are displayed) Labs Reviewed  LIPASE, BLOOD - Abnormal; Notable for the following:       Result Value   Lipase 57 (*)    All other components within normal limits  COMPREHENSIVE METABOLIC PANEL - Abnormal; Notable for the following:    Sodium 132 (*)    Chloride 99 (*)    Glucose, Bld 107 (*)    ALT 13 (*)    Alkaline Phosphatase 33 (*)    All other components within normal limits  CBC - Abnormal; Notable for the following:    WBC 3.0 (*)    RBC 3.42 (*)    Hemoglobin 10.6 (*)    HCT 32.2 (*)    All other components within  normal limits  URINALYSIS, ROUTINE W REFLEX MICROSCOPIC - Abnormal; Notable for the following:    Color, Urine STRAW (*)    Specific Gravity, Urine 1.004 (*)    Leukocytes, UA TRACE (*)    Bacteria, UA RARE (*)    All other components within normal limits  Rosezena Sensor, ED    EKG  EKG Interpretation  Date/Time:  Wednesday July 13 2016 04:59:24 EDT Ventricular Rate:  57  PR Interval:    QRS Duration: 147 QT Interval:  423 QTC Calculation: 495 R Axis:   -28 Text Interpretation:  Sinus rhythm Prolonged PR interval Right bundle branch block Left ventricular hypertrophy Borderline prolonged QT interval No significant change since last tracing Confirmed by Ross MarcusHorton, Courtney ([id number]) on 07/13/2016 6:44:37 AM       Radiology Dg Abdomen Acute W/chest  Result Date: 07/13/2016 CLINICAL DATA:  Abdominal pain for several weeks, worsened over the past few days. EXAM: DG ABDOMEN ACUTE W/ 1V CHEST COMPARISON:  CT 07/10/2016 FINDINGS: There is no evidence of dilated bowel loops or free intraperitoneal air. No radiopaque calculi or other significant radiographic abnormality is seen. Heart size and mediastinal contours are within normal limits. Both lungs are clear. IMPRESSION: Negative abdominal radiographs.  No acute cardiopulmonary disease. Electronically Signed   By: Ellery Plunkaniel R Mitchell M.D.   On: 07/13/2016 05:04    Procedures Procedures (including critical care time)  Medications Ordered in ED Medications  LORazepam (ATIVAN) injection 0.5 mg (0.5 mg Intravenous Given 07/13/16 0441)  ondansetron (ZOFRAN) injection 4 mg (4 mg Intravenous Given 07/13/16 0441)  sodium chloride 0.9 % bolus 1,000 mL (0 mLs Intravenous Stopped 07/13/16 0631)     Initial Impression / Assessment and Plan / ED Course  I have reviewed the triage vital signs and the nursing notes.  Pertinent labs & imaging results that were available during my care of the patient were reviewed by me and considered in my medical  decision making (see chart for details).     Patient presents with refractory abdominal pain. She's had multiple evaluations to include a CT scan and lab work. Family is concerned that her pain may be exacerbated by increasing anxiety. She was recently placed on Zoloft by her primary physician. She is nontoxic on exam per vital signs reassuring. She does have tenderness in the epigastrium. It appears that therapies for gastritis has not been very successful at home. She likely needs definitive EGD. Lab work here remains reassuring. She does have mild elevation in her lipase but not into a range to suggest pancreatitis.  Acute abdominal series shows new free air. She was given Ativan, fluids, Zofran. She did have some improvement of her symptoms with this medication. The family is very worried about her recurrent pain. Given that this is her third visit in less than 3 days for the same thing, feel it reasonable to admit for pain control and evaluation by GI.  This was discussed to Dr. Toniann FailKakrakandy.  He is requesting troponin. This was added.  Final Clinical Impressions(s) / ED Diagnoses   Final diagnoses:  Epigastric pain  Anxiety    New Prescriptions New Prescriptions   No medications on file   I personally performed the services described in this documentation, which was scribed in my presence. The recorded information has been reviewed and is accurate.    Shon BatonHorton, Courtney F, MD 07/13/16 [phone]

## 2016-07-14 DIAGNOSIS — F332 Major depressive disorder, recurrent severe without psychotic features: Secondary | ICD-10-CM | POA: Diagnosis not present

## 2016-07-14 DIAGNOSIS — R1032 Left lower quadrant pain: Secondary | ICD-10-CM

## 2016-07-14 DIAGNOSIS — F329 Major depressive disorder, single episode, unspecified: Secondary | ICD-10-CM | POA: Diagnosis not present

## 2016-07-14 DIAGNOSIS — F32A Depression, unspecified: Secondary | ICD-10-CM

## 2016-07-14 DIAGNOSIS — R110 Nausea: Secondary | ICD-10-CM | POA: Diagnosis not present

## 2016-07-14 DIAGNOSIS — G4700 Insomnia, unspecified: Secondary | ICD-10-CM | POA: Diagnosis not present

## 2016-07-14 DIAGNOSIS — F419 Anxiety disorder, unspecified: Secondary | ICD-10-CM

## 2016-07-14 DIAGNOSIS — F4381 Prolonged grief disorder: Secondary | ICD-10-CM | POA: Diagnosis present

## 2016-07-14 DIAGNOSIS — F4329 Adjustment disorder with other symptoms: Secondary | ICD-10-CM | POA: Diagnosis present

## 2016-07-14 LAB — COMPREHENSIVE METABOLIC PANEL
ALT: 11 U/L — ABNORMAL LOW (ref 14–54)
ANION GAP: 10 (ref 5–15)
AST: 19 U/L (ref 15–41)
Albumin: 3.5 g/dL (ref 3.5–5.0)
Alkaline Phosphatase: 31 U/L — ABNORMAL LOW (ref 38–126)
BUN: 20 mg/dL (ref 6–20)
CHLORIDE: 104 mmol/L (ref 101–111)
CO2: 24 mmol/L (ref 22–32)
Calcium: 9.7 mg/dL (ref 8.9–10.3)
Creatinine, Ser: 0.99 mg/dL (ref 0.44–1.00)
GFR, EST NON AFRICAN AMERICAN: 56 mL/min — AB (ref >60)
Glucose, Bld: 93 mg/dL (ref 65–99)
POTASSIUM: 4 mmol/L (ref 3.5–5.1)
Sodium: 138 mmol/L (ref 135–145)
Total Bilirubin: 0.6 mg/dL (ref 0.3–1.2)
Total Protein: 6.1 g/dL — ABNORMAL LOW (ref 6.5–8.1)

## 2016-07-14 MED ORDER — ENOXAPARIN SODIUM 40 MG/0.4ML ~~LOC~~ SOLN
40.0000 mg | SUBCUTANEOUS | Status: DC
  Administered 2016-07-14 – 2016-07-15 (×2): 40 mg via SUBCUTANEOUS
  Filled 2016-07-14 (×2): qty 0.4

## 2016-07-14 MED ORDER — WITCH HAZEL-GLYCERIN EX PADS
MEDICATED_PAD | CUTANEOUS | Status: DC | PRN
Start: 2016-07-14 — End: 2016-07-15
  Administered 2016-07-14: 17:00:00 via TOPICAL
  Filled 2016-07-14: qty 100

## 2016-07-14 MED ORDER — DULOXETINE HCL 30 MG PO CPEP
30.0000 mg | ORAL_CAPSULE | Freq: Every day | ORAL | Status: DC
  Administered 2016-07-14 – 2016-07-15 (×2): 30 mg via ORAL
  Filled 2016-07-14 (×2): qty 1

## 2016-07-14 MED ORDER — GABAPENTIN 100 MG PO CAPS
100.0000 mg | ORAL_CAPSULE | Freq: Two times a day (BID) | ORAL | Status: DC
  Administered 2016-07-14: 100 mg via ORAL
  Filled 2016-07-14: qty 1

## 2016-07-14 MED ORDER — ALPRAZOLAM 0.5 MG PO TABS
0.5000 mg | ORAL_TABLET | Freq: Two times a day (BID) | ORAL | Status: DC | PRN
Start: 2016-07-14 — End: 2016-07-15
  Administered 2016-07-14: 0.5 mg via ORAL
  Filled 2016-07-14: qty 1

## 2016-07-14 MED ORDER — MIRTAZAPINE 15 MG PO TABS
7.5000 mg | ORAL_TABLET | Freq: Every day | ORAL | Status: DC
  Administered 2016-07-14: 7.5 mg via ORAL
  Filled 2016-07-14: qty 1

## 2016-07-14 MED ORDER — GABAPENTIN 100 MG PO CAPS
100.0000 mg | ORAL_CAPSULE | Freq: Three times a day (TID) | ORAL | Status: DC
  Administered 2016-07-14 (×2): 100 mg via ORAL
  Filled 2016-07-14 (×2): qty 1

## 2016-07-14 MED ORDER — BISACODYL 10 MG RE SUPP
10.0000 mg | Freq: Once | RECTAL | Status: AC
  Administered 2016-07-14: 10 mg via RECTAL
  Filled 2016-07-14: qty 1

## 2016-07-14 MED ORDER — CLONAZEPAM 0.5 MG PO TABS
0.5000 mg | ORAL_TABLET | Freq: Two times a day (BID) | ORAL | Status: DC
  Administered 2016-07-14 – 2016-07-15 (×2): 0.5 mg via ORAL
  Filled 2016-07-14 (×2): qty 1

## 2016-07-14 MED ORDER — POLYETHYLENE GLYCOL 3350 17 G PO PACK
17.0000 g | PACK | Freq: Two times a day (BID) | ORAL | Status: DC
  Administered 2016-07-14 (×2): 17 g via ORAL
  Filled 2016-07-14 (×3): qty 1

## 2016-07-14 NOTE — Progress Notes (Signed)
[facility] Progress Note  Sandy OrisBetty R Hodges 71 y.o. 1945/12/03  CC:  Abdominal pain   Subjective: Patient had 3 episodes of anxiety and panic attack yesterday. According to her she had nausea and abdominal distention along with those episodes. No bowel movement since admission. She denied any abdominal pain this morning.  ROS : Positive for anxiety and panic attack. Negative for chest pain and shortness of breath.   Objective: Vital signs in last 24 hours: Vitals:   07/13/16 2207 07/14/16 0525  BP: (!) 157/81 116/62  Pulse: 64 61  Resp: 20 20  Temp: 98.5 F (36.9 C) 98.4 F (36.9 C)    Physical Exam:  General:  Alert, cooperative, no distress, appears stated age  Head:  Normocephalic, without obvious abnormality, atraumatic  Eyes:  , EOM's intact,   Lungs:   Clear to auscultation bilaterally, respirations unlabored  Heart:  Regular rate and rhythm, S1, S2 normal  Abdomen:   Soft, non-tender, bowel sounds active all four quadrants,  no masses,   Extremities: Extremities normal, atraumatic, no  edema  Pulses: 2+ and symmetric    Lab Results:  Recent Labs  07/12/16 2008 07/13/16 1022 07/14/16 0524  NA 132*  --  138  K 4.2  --  4.0  CL 99*  --  104  CO2 24  --  24  GLUCOSE 107*  --  93  BUN 15  --  20  CREATININE 0.90 0.82 0.99  CALCIUM 9.8  --  9.7    Recent Labs  07/12/16 2008 07/14/16 0524  AST 19 19  ALT 13* 11*  ALKPHOS 33* 31*  BILITOT 0.7 0.6  PROT 6.7 6.1*  ALBUMIN 4.0 3.5    Recent Labs  07/12/16 2008 07/13/16 1022  WBC 3.0* 2.5*  HGB 10.6* 10.8*  HCT 32.2* 33.2*  MCV 94.2 93.8  PLT 233 211   No results for input(s): LABPROT, INR in the last 72 hours.    Assessment/Plan: - Left upper quadrant pain.  Unclear etiology. Recent CT on 07/10/2016 showed no acute changes.?? Worse with anxiety. - GERD. Improving. - Mild elevated lipase. Nonspecific. CT scan showed no evidence of pancreatitis. - Acute on chronic anemia. -  Constipation.  - Status post left TKA  06/24/2016 . Currently on Eliquis   Recommendations ------------------------- - Patient complaining of nausea and abdominal distention which is worse with anxiety. She is currently on a Eliquis   for DVT prophylaxis after her total knee replacement. - Upper GI with small bowel series today. If negative consider EGD tomorrow. - Patient is also  on the Reglan. Continue current medication. Continue Carafate and twice a day PPI - Patient has no bowel movement since admission. Increase MiraLAX to twice a day. Add Dulcolax suppository - She was advised to follow-up with Dr. Madilyn FiremanHayes after discharge for follow-up of anemia.   Kathi DerParag Elnoria Livingston MD, FACP 07/14/2016, 8:36 AM  Pager [phone]  If no answer or after 5 PM call [phone]

## 2016-07-14 NOTE — Progress Notes (Signed)
PROGRESS NOTE    Sandy Hodges  ZOX:[id number] DOB: February 16, 1945 DOA: 07/13/2016 PCP: [name], MD   Brief Narrative: 71 y.o. female with medical history significant of osteoarthritis, hypertension, hyperlipidemia, chronic pancytopenia, fibromyalgia, depression, anxiety presented to the ER with the complaints of epigastric abdominal pain. Patient had total knee replacement on 06/24/2016. The patient came to ER on 6/24 for similar complaint. At that time the CT scan of abdomen pelvis and labs are unremarkable and patient was discharged. The pain is not improving and worsened with her anxiety and depression. Seen by GI.  Assessment & Plan:  # Persistent left upper quadrant/epigastric pain : Exact etiology unknown, gastritis versus functional dyspepsia. CT scan of abdomen and pelvis done few days ago showed scattered colon diverticula without any acute finding. Labs including lipase level unremarkable. Patient with anxiety depression and has crying spells. Unknown if her symptoms are exacerbated by her anxiety/depression. -Already evaluated by GI, ordered a small bowel series. If this is negative consider endoscopy. Patient is nothing by mouth. -Continue PPI, MiraLAX and supportive care.  #Hypertension: Continue atenolol, lisinopril/hctz.monitor BP  # Anxiety/Severe depression: Patient reported depression and having crying spells. Patient's son at bedside. Patient reported that her symptoms worsened after her husband passed away last December 29, 2022. She was recently started on Zoloft. She is willing to talk to psychiatrist. I discussed with the psychiatrist for the consult. Patient denied suicidal or homicidal ideation.  #Osteoarthritis of Knees, s/p recent surgery: Pain management and supportive care. PT OT evaluation  #Hyperlipidemia: Continue statin  #anemia of chronic disease: Continue hemoglobin monitoring.   Active Problems:   Abdominal pain  DVT prophylaxis: Lovenox subcutaneous    Code Status: Full code Family Communication:Patient's son at bedside Disposition Plan: Likely discharge home tomorrow    Consultants:    GI  Psychiatrist   Procedures: none  Antimicrobials: none  Subjective:  seen and examined at bedside. He reported anxiety depression and not improvement in left upper quadrant pain. Denied headache, dizziness, chest pain or shortness of breath. Denied suicidal or homicidal ideation.   Objective: Vitals:   07/13/16 0841 07/13/16 1422 07/13/16 2207 07/14/16 0525  BP: (!) 158/90 138/81 (!) 157/81 116/62  Pulse: (!) 107 81 64 61  Resp: 20 17 20 20   Temp: 99.1 F (37.3 C) 98.5 F (36.9 C) 98.5 F (36.9 C) 98.4 F (36.9 C)  TempSrc: Oral Oral Oral Oral  SpO2: 100% 99% 100% 99%  Weight:  66.1 kg (145 lb 11.2 oz)    Height: 5\' 5"  (1.651 m)       Intake/Output Summary (Last 24 hours) at 07/14/16 1312 Last data filed at 07/14/16 0600  Gross per 24 hour  Intake              360 ml  Output                0 ml  Net              360 ml   Filed Weights   07/13/16 1422  Weight: 66.1 kg (145 lb 11.2 oz)    Examination:  General exam: Appears calm and comfortable  Respiratory system: Clear to auscultation. Respiratory effort normal. No wheezing or crackle Cardiovascular system: S1 & S2 heard, RRR.  No pedal edema. Gastrointestinal system: Abdomen is nondistended, soft and nontender. Normal bowel sounds heard. Central nervous system: Alert and oriented. No focal neurological deficits. Extremities: Symmetric 5 x 5 power. Skin: No rashes, lesions or ulcers Psychiatry: +  anxiety, +depression.  denied suicidal or homicidal ideation.     Data Reviewed: I have personally reviewed following labs and imaging studies  CBC:  Recent Labs Lab 07/10/16 1538 07/12/16 2008 07/13/16 1022  WBC 3.3* 3.0* 2.5*  HGB 10.6* 10.6* 10.8*  HCT 32.6* 32.2* 33.2*  MCV 95.3 94.2 93.8  PLT 234 233 211   Basic Metabolic Panel:  Recent Labs Lab  07/10/16 1538 07/12/16 2008 07/13/16 1022 07/14/16 0524  NA 135 132*  --  138  K 4.5 4.2  --  4.0  CL 102 99*  --  104  CO2 25 24  --  24  GLUCOSE 113* 107*  --  93  BUN 22* 15  --  20  CREATININE 1.09* 0.90 0.82 0.99  CALCIUM 10.0 9.8  --  9.7   GFR: Estimated Creatinine Clearance: 46.2 mL/min (by C-G formula based on SCr of 0.99 mg/dL). Liver Function Tests:  Recent Labs Lab 07/10/16 1538 07/12/16 2008 07/14/16 0524  AST 19 19 19   ALT 13* 13* 11*  ALKPHOS 31* 33* 31*  BILITOT 0.6 0.7 0.6  PROT 6.8 6.7 6.1*  ALBUMIN 4.1 4.0 3.5    Recent Labs Lab 07/10/16 1538 07/12/16 2008  LIPASE 43 57*   No results for input(s): AMMONIA in the last 168 hours. Coagulation Profile: No results for input(s): INR, PROTIME in the last 168 hours. Cardiac Enzymes:  Recent Labs Lab 07/13/16 1022 07/13/16 1431 07/13/16 2057  TROPONINI <0.03 <0.03 <0.03   BNP (last 3 results) No results for input(s): PROBNP in the last 8760 hours. HbA1C: No results for input(s): HGBA1C in the last 72 hours. CBG: No results for input(s): GLUCAP in the last 168 hours. Lipid Profile: No results for input(s): CHOL, HDL, LDLCALC, TRIG, CHOLHDL, LDLDIRECT in the last 72 hours. Thyroid Function Tests: No results for input(s): TSH, T4TOTAL, FREET4, T3FREE, THYROIDAB in the last 72 hours. Anemia Panel: No results for input(s): VITAMINB12, FOLATE, FERRITIN, TIBC, IRON, RETICCTPCT in the last 72 hours. Sepsis Labs: No results for input(s): PROCALCITON, LATICACIDVEN in the last 168 hours.  No results found for this or any previous visit (from the past 240 hour(s)).       Radiology Studies: Dg Abdomen Acute W/chest  Result Date: 07/13/2016 CLINICAL DATA:  Abdominal pain for several weeks, worsened over the past few days. EXAM: DG ABDOMEN ACUTE W/ 1V CHEST COMPARISON:  CT 07/10/2016 FINDINGS: There is no evidence of dilated bowel loops or free intraperitoneal air. No radiopaque calculi or other  significant radiographic abnormality is seen. Heart size and mediastinal contours are within normal limits. Both lungs are clear. IMPRESSION: Negative abdominal radiographs.  No acute cardiopulmonary disease. Electronically Signed   By: Ellery Plunk M.D.   On: 07/13/2016 05:04        Scheduled Meds: . ALPRAZolam  0.5 mg Oral Daily  . ALPRAZolam  1 mg Oral QHS  . aspirin EC  81 mg Oral Daily  . atenolol  50 mg Oral QPM  . calcium-vitamin D   Oral BID  . cholecalciferol  1,000 Units Oral Daily  . docusate sodium  100 mg Oral BID  . enoxaparin (LOVENOX) injection  40 mg Subcutaneous Q24H  . ferrous sulfate  325 mg Oral Q M,W,F  . gabapentin  100 mg Oral BID  . lisinopril  20 mg Oral Daily   And  . hydrochlorothiazide  12.5 mg Oral Daily  . loratadine  10 mg Oral BID  . multivitamin with minerals  1 tablet Oral QPM  . omega-3 acid ethyl esters  2 g Oral BID  . pantoprazole  40 mg Oral BID  . polyethylene glycol  17 g Oral BID  . sertraline  25 mg Oral Daily  . simvastatin  20 mg Oral QPM  . sucralfate  1 g Oral TID WC & HS   Continuous Infusions:   LOS: 0 days    Dron Jaynie CollinsPrasad Bhandari, MD [facility] Pager [phone]  If 7PM-7AM, please contact night-coverage [email or web address] Password [email or web address] 07/14/2016, 1:12 PM

## 2016-07-14 NOTE — Consult Note (Signed)
[facility] Consult   Reason for Consult:  Depression and loss of family Referring Physician:  Dr. Carolin Sicks Patient Identification: Sandy Hodges MRN:  [id number] Principal Diagnosis: MDD (major depressive disorder), recurrent severe, without psychosis ([place]) Diagnosis:   Patient Active Problem List   Diagnosis Date Noted  . Abdominal pain [R10.9] 07/13/2016  . Primary localized osteoarthritis of left knee [M17.12] 06/24/2016  . Primary localized osteoarthritis of right knee [M17.11] 10/16/2015  . Depression [F32.9] 10/16/2014  . Arthritis [M19.90] 11/14/2013  . Essential hypertension [I10] 11/14/2013  . Preventive measure [Z29.9] 11/14/2013  . Uterine mass [N85.9] 08/01/2013  . Deficiency anemia [D53.9] 07/09/2013  . Fatigue [R53.83] 07/09/2013  . Knee pain [M25.569] 07/09/2013  . Back pain [M54.9] 07/09/2013  . Abdominal pain, unspecified site [R10.9] 07/09/2013  . Leukopenia [D72.819] 07/09/2013  . Flank pain [R10.9] 07/09/2013  . Pancytopenia [facility]) [D61.818] 12/14/2011    Total Time spent with patient: 1 hour  Subjective:   Sandy Hodges is a 71 y.o. female patient admitted with abdomen pain.  HPI:  Sandy Hodges is a 71 y.o. femalew, seen, chart reviewed and case discussed with CSW for this face-to-face psychiatric consultation for increased symptoms of depression without psychosis or suicidal ideation. Patient has been suffering with depression more than 2 years secondary to her husband's chronic medical condition and now he passed away about 6 months ago. Patient reported she has been living by herself and has been depressed, dysphoric, tearful, loss of energy, loss of interest, no motivation and has been receiving outpatient medication management from primary care physician. Patient reported her depression has been extremely increased since he had a left knee surgery about 2-3 weeks ago on 06/24/2016 and has been taking opiates alternating with Tylenol  to manage her pain. Patient reported she is able to walk with the help of physical therapist. Patient is willing to give a trial of Cymbalta, gabapentin and Remeron and to control her depression, anxiety and insomnia. Patient reported she has it to children and 6 grandchildren who are close to her.  Past Psychiatric History: Patient has no previous acute psychiatric hospitalization or outpatient psychiatric medication management.  Risk to Self: Is patient at risk for suicide?: No Risk to Others:   Prior Inpatient Therapy:   Prior Outpatient Therapy:    Past Medical History:  Past Medical History:  Diagnosis Date  . Abdominal pain, unspecified site 07/09/2013  . Anxiety   . Arthritis 11/14/2013  . Back pain 07/09/2013  . Blood dyscrasia    Pancytopenial; followed at [facility] - Dr. Alvy Bimler  . Depression 10/16/2014  . Essential hypertension 11/14/2013  . Fatigue 07/09/2013  . Fibromyalgia   . Hyperlipidemia   . Hypertension   . Knee pain 07/09/2013  . OA (osteoarthritis) of knee   . Pancytopenia ([place]) 12/14/2011  . Pancytopenia ([place])   . PONV (postoperative nausea and vomiting)    nausea an vomiting  . Unspecified deficiency anemia 07/09/2013    Past Surgical History:  Procedure Laterality Date  . COLONOSCOPY    . JOINT REPLACEMENT    . 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 ROTATOR CUFF REPAIR Right 09/2014  . 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 TOTAL KNEE ARTHROPLASTY Left 06/24/2016   Procedure: LEFT TOTAL KNEE ARTHROPLASTY;  Surgeon: Earlie Server, MD;  Location: [facility];  Service: Orthopedics;  Laterality: Left;   Family History:  Family History  Problem Relation Age of Onset  . Cancer Mother        breast ca  . Hypertension Father   . CVA Father    Family Psychiatric  History: Patient endorses family history of depression  and anxiety and multiple family members.  Social History:  History  Alcohol Use No     History  Drug Use No    Social History   Social History  . Marital status: Widowed    Spouse name: N/A  . Number of children: N/A  . Years of education: N/A   Social History Main Topics  . Smoking status: Never Smoker  . Smokeless tobacco: Never Used  . Alcohol use No  . Drug use: No  . Sexual activity: Not Asked   Other Topics Concern  . None   Social History Narrative  . None   Additional Social History:    Allergies:   Allergies  Allergen Reactions  . No Known Allergies     Labs:  Results for orders placed or performed during the hospital encounter of 07/13/16 (from the past 48 hour(s))  Lipase, blood     Status: Abnormal   Collection Time: 07/12/16  8:08 PM  Result Value Ref Range   Lipase 57 (H) 11 - 51 U/L  Comprehensive metabolic panel     Status: Abnormal   Collection Time: 07/12/16  8:08 PM  Result Value Ref Range   Sodium 132 (L) 135 - 145 mmol/L   Potassium 4.2 3.5 - 5.1 mmol/L   Chloride 99 (L) 101 - 111 mmol/L   CO2 24 22 - 32 mmol/L   Glucose, Bld 107 (H) 65 - 99 mg/dL   BUN 15 6 - 20 mg/dL   Creatinine, Ser 0.90 0.44 - 1.00 mg/dL   Calcium 9.8 8.9 - 10.3 mg/dL   Total Protein 6.7 6.5 - 8.1 g/dL   Albumin 4.0 3.5 - 5.0 g/dL   AST 19 15 - 41 U/L   ALT 13 (L) 14 - 54 U/L   Alkaline Phosphatase 33 (L) 38 - 126 U/L   Total Bilirubin 0.7 0.3 - 1.2 mg/dL   GFR calc non Af Amer >60 >60 mL/min   GFR calc Af Amer >60 >60 mL/min    Comment: (NOTE) The eGFR has been calculated using the CKD EPI equation. This calculation has not been validated in all clinical situations. eGFR's persistently <60 mL/min signify possible Chronic Kidney Disease.    Anion gap 9 5 - 15  CBC     Status: Abnormal   Collection Time: 07/12/16  8:08 PM  Result Value Ref Range   WBC 3.0 (L) 4.0 - 10.5 K/uL   RBC 3.42 (L) 3.87 - 5.11 MIL/uL   Hemoglobin 10.6 (L) 12.0 - 15.0 g/dL    HCT 32.2 (L) 36.0 - 46.0 %   MCV 94.2 78.0 - 100.0 fL   MCH 31.0 26.0 - 34.0 pg   MCHC 32.9 30.0 - 36.0 g/dL   RDW 14.0 11.5 - 15.5 %   Platelets 233 150 - 400 K/uL  Urinalysis, Routine w reflex microscopic     Status: Abnormal   Collection Time: 07/13/16  5:59 AM  Result Value Ref Range   Color, Urine STRAW (A) YELLOW   APPearance CLEAR CLEAR   Specific Gravity, Urine 1.004 (L) 1.005 - 1.030   pH 8.0 5.0 - 8.0   Glucose, UA NEGATIVE NEGATIVE mg/dL  Hgb urine dipstick NEGATIVE NEGATIVE   Bilirubin Urine NEGATIVE NEGATIVE   Ketones, ur NEGATIVE NEGATIVE mg/dL   Protein, ur NEGATIVE NEGATIVE mg/dL   Nitrite NEGATIVE NEGATIVE   Leukocytes, UA TRACE (A) NEGATIVE   RBC / HPF 0-5 0 - 5 RBC/hpf   WBC, UA 0-5 0 - 5 WBC/hpf   Bacteria, UA RARE (A) NONE SEEN   Squamous Epithelial / LPF NONE SEEN NONE SEEN  I-Stat Troponin, ED (not at [facility])     Status: None   Collection Time: 07/13/16  8:09 AM  Result Value Ref Range   Troponin i, poc 0.00 0.00 - 0.08 ng/mL   Comment 3            Comment: Due to the release kinetics of cTnI, a negative result within the first hours of the onset of symptoms does not rule out myocardial infarction with certainty. If myocardial infarction is still suspected, repeat the test at appropriate intervals.   CBC     Status: Abnormal   Collection Time: 07/13/16 10:22 AM  Result Value Ref Range   WBC 2.5 (L) 4.0 - 10.5 K/uL   RBC 3.54 (L) 3.87 - 5.11 MIL/uL   Hemoglobin 10.8 (L) 12.0 - 15.0 g/dL   HCT 33.2 (L) 36.0 - 46.0 %   MCV 93.8 78.0 - 100.0 fL   MCH 30.5 26.0 - 34.0 pg   MCHC 32.5 30.0 - 36.0 g/dL   RDW 14.3 11.5 - 15.5 %   Platelets 211 150 - 400 K/uL  Creatinine, serum     Status: None   Collection Time: 07/13/16 10:22 AM  Result Value Ref Range   Creatinine, Ser 0.82 0.44 - 1.00 mg/dL   GFR calc non Af Amer >60 >60 mL/min   GFR calc Af Amer >60 >60 mL/min    Comment: (NOTE) The eGFR has been calculated using the CKD EPI equation. This  calculation has not been validated in all clinical situations. eGFR's persistently <60 mL/min signify possible Chronic Kidney Disease.   Troponin I     Status: None   Collection Time: 07/13/16 10:22 AM  Result Value Ref Range   Troponin I <0.03 <0.03 ng/mL  Troponin I     Status: None   Collection Time: 07/13/16  2:31 PM  Result Value Ref Range   Troponin I <0.03 <0.03 ng/mL  Troponin I     Status: None   Collection Time: 07/13/16  8:57 PM  Result Value Ref Range   Troponin I <0.03 <0.03 ng/mL  Comprehensive metabolic panel     Status: Abnormal   Collection Time: 07/14/16  5:24 AM  Result Value Ref Range   Sodium 138 135 - 145 mmol/L   Potassium 4.0 3.5 - 5.1 mmol/L   Chloride 104 101 - 111 mmol/L   CO2 24 22 - 32 mmol/L   Glucose, Bld 93 65 - 99 mg/dL   BUN 20 6 - 20 mg/dL   Creatinine, Ser 0.99 0.44 - 1.00 mg/dL   Calcium 9.7 8.9 - 10.3 mg/dL   Total Protein 6.1 (L) 6.5 - 8.1 g/dL   Albumin 3.5 3.5 - 5.0 g/dL   AST 19 15 - 41 U/L   ALT 11 (L) 14 - 54 U/L   Alkaline Phosphatase 31 (L) 38 - 126 U/L   Total Bilirubin 0.6 0.3 - 1.2 mg/dL   GFR calc non Af Amer 56 (L) >60 mL/min   GFR calc Af Amer >60 >60 mL/min    Comment: (  NOTE) The eGFR has been calculated using the CKD EPI equation. This calculation has not been validated in all clinical situations. eGFR's persistently <60 mL/min signify possible Chronic Kidney Disease.    Anion gap 10 5 - 15    Current Facility-Administered Medications  Medication Dose Route Frequency Provider Last Rate Last Dose  . acetaminophen (TYLENOL) tablet 1,000 mg  1,000 mg Oral BID PRN Damita Lack, MD   1,000 mg at 07/13/16 2150  . ALPRAZolam Duanne Moron) tablet 0.5 mg  0.5 mg Oral Daily Amin, Ankit Chirag, MD   0.5 mg at 07/14/16 1026  . ALPRAZolam Duanne Moron) tablet 1 mg  1 mg Oral QHS Amin, Ankit Chirag, MD   1 mg at 07/13/16 2150  . aspirin EC tablet 81 mg  81 mg Oral Daily Amin, Ankit Chirag, MD   81 mg at 07/14/16 1026  . atenolol  (TENORMIN) tablet 50 mg  50 mg Oral QPM Amin, Ankit Chirag, MD   50 mg at 07/13/16 1741  . calcium-vitamin D (OSCAL WITH D) 500-200 MG-UNIT per tablet   Oral BID Damita Lack, MD   1 tablet at 07/14/16 1026  . cholecalciferol (VITAMIN D) tablet 1,000 Units  1,000 Units Oral Daily Amin, Ankit Chirag, MD   1,000 Units at 07/14/16 1026  . docusate sodium (COLACE) capsule 100 mg  100 mg Oral BID Amin, Ankit Chirag, MD   100 mg at 07/14/16 1026  . enoxaparin (LOVENOX) injection 40 mg  40 mg Subcutaneous Q24H Rosita Fire, MD      . ferrous sulfate tablet 325 mg  325 mg Oral Q M,W,F Amin, Ankit Chirag, MD   325 mg at 07/13/16 1741  . gabapentin (NEURONTIN) capsule 100 mg  100 mg Oral BID Rosita Fire, MD   100 mg at 07/14/16 1026  . lisinopril (PRINIVIL,ZESTRIL) tablet 20 mg  20 mg Oral Daily Amin, Ankit Chirag, MD   20 mg at 07/14/16 1026   And  . hydrochlorothiazide (MICROZIDE) capsule 12.5 mg  12.5 mg Oral Daily Amin, Ankit Chirag, MD   12.5 mg at 07/14/16 1026  . loratadine (CLARITIN) tablet 10 mg  10 mg Oral BID Damita Lack, MD   10 mg at 07/13/16 2150  . metoCLOPramide (REGLAN) injection 5 mg  5 mg Intravenous Q8H PRN Amin, Ankit Chirag, MD   5 mg at 07/13/16 1703  . multivitamin with minerals tablet 1 tablet  1 tablet Oral QPM Amin, Jeanella Flattery, MD   1 tablet at 07/13/16 1741  . omega-3 acid ethyl esters (LOVAZA) capsule 2 g  2 g Oral BID Amin, Ankit Chirag, MD   2 g at 07/14/16 1026  . ondansetron (ZOFRAN) tablet 4 mg  4 mg Oral Q8H PRN Amin, Ankit Chirag, MD   4 mg at 07/13/16 1519  . oxyCODONE-acetaminophen (PERCOCET/ROXICET) 5-325 MG per tablet 1-2 tablet  1-2 tablet Oral Q4H PRN Damita Lack, MD   2 tablet at 07/13/16 1741  . pantoprazole (PROTONIX) EC tablet 40 mg  40 mg Oral BID Brahmbhatt, Parag, MD   40 mg at 07/14/16 1027  . polyethylene glycol (MIRALAX / GLYCOLAX) packet 17 g  17 g Oral BID Brahmbhatt, Parag, MD   17 g at 07/14/16 1027  . sertraline  (ZOLOFT) tablet 25 mg  25 mg Oral Daily Amin, Ankit Chirag, MD   25 mg at 07/14/16 1026  . simvastatin (ZOCOR) tablet 20 mg  20 mg Oral QPM Amin, Jeanella Flattery, MD  20 mg at 07/13/16 1741  . sucralfate (CARAFATE) 1 GM/10ML suspension 1 g  1 g Oral TID WC & HS Amin, Ankit Chirag, MD   1 g at 07/14/16 1024    Musculoskeletal: Strength & Muscle Tone: decreased Gait & Station: unable to stand Patient leans: N/A  Psychiatric Specialty Exam: Physical Exam as per history and physical   ROS patient complaining about abdominal pain, nausea, decreased appetite and insomnia. Patient has no chest pain or shortness of breath. No Fever-chills, No Headache, No changes with Vision or hearing, reports vertigo No problems swallowing food or Liquids, No Chest pain, Cough or Shortness of Breath, No Abdominal pain, No Nausea or Vommitting, Bowel movements are regular, No Blood in stool or Urine, No dysuria, No new skin rashes or bruises, No new joints pains-aches,  No new weakness, tingling, numbness in any extremity, No recent weight gain or loss, No polyuria, polydypsia or polyphagia,  A full 10 point Review of Systems was done, except as stated above, all other Review of Systems were negative.  Blood pressure 116/62, pulse 61, temperature 98.4 F (36.9 C), temperature source Oral, resp. rate 20, height '5\' 5"'$  (1.651 m), weight 66.1 kg (145 lb 11.2 oz), SpO2 99 %.Body mass index is 24.25 kg/m.  General Appearance: Guarded  Eye Contact:  Fair  Speech:  Clear and Coherent and Slow  Volume:  Decreased  Mood:  Anxious and Depressed  Affect:  Depressed and Tearful  Thought Process:  Coherent and Goal Directed  Orientation:  Full (Time, Place, and Person)  Thought Content:  Logical and Rumination  Suicidal Thoughts:  No  Homicidal Thoughts:  No  Memory:  Immediate;   Fair Recent;   Fair Remote;   Fair  Judgement:  Intact  Insight:  Good  Psychomotor Activity:  Psychomotor Retardation   Concentration:  Concentration: Fair and Attention Span: Fair  Recall:  Good  Fund of Knowledge:  Fair  Language:  Good  Akathisia:  Negative  Handed:  Right  AIMS (if indicated):     Assets:  Communication Skills Desire for Improvement Financial Resources/Insurance Housing Leisure Time Resilience Social Support  ADL's:  Impaired  Cognition:  WNL  Sleep:        Treatment Plan Summary: 59 sol female presented with increased symptoms of depression, anxiety, psychomotor retardation and multiple medical problems and also presented with nausea and vomiting. Patient reportedly started SSRI Zoloft 25 mg once daily about 2 weeks ago.  Patient has no safety concerns  Will discontinue Zoloft as it causes GI disturbance  We start Cymbalta 30 mg daily starting today  We will increase gabapentin 100 mg 3 times daily  We start mirtazapine 7.5 mg at bedtime for insomnia and poor appetite We start clonazepam 0.5 mg twice daily for anxiety  Continue alprazolam 0.5 mg twice daily as needed for panic episodes.   Daily contact with patient to assess and evaluate symptoms and progress in treatment and Medication management   CSW we provide referral to the outpatient psychiatric services in [place].  Appreciate psychiatric consultation and follow up as clinically required Please contact [phone] or [phone] if needs further assistance  Disposition: Patient will be referred to the outpatient ligation management plan medically stable. No evidence of imminent risk to self or others at present.   Supportive therapy provided about ongoing stressors.  Ambrose Finland, MD 07/14/2016 11:43 AM

## 2016-07-14 NOTE — Clinical Social Work Note (Signed)
Clinical Social Work Assessment  Patient Details  Name: Sandy Hodges MRN: [id number] Date of Birth: Jul 03, 1945  Date of referral:  07/14/16               Reason for consult:  Facility Placement                Permission sought to share information with:  Psychiatrist Permission granted to share information::     Name::        Agency::     Relationship::     Contact Information:     Housing/Transportation Living arrangements for the past 2 months:  Single Family Home Source of Information:  Patient Patient Interpreter Needed:  None Criminal Activity/Legal Involvement Pertinent to Current Situation/Hospitalization:  No - Comment as needed Significant Relationships:  Adult Children, [place] Lives with:  Adult Children Do you feel safe going back to the place where you live?  Yes Need for family participation in patient care:  No (Coment)  Care giving concerns:  Patient admitted for abdominal pain. Patient has significant medical history of depression and anxiety. Patient has been very tearful and reports feeling depressed during this admission. Patient reports during her spouse long illness and death her depression started.  Patient reports since her knee replacement 06/24/2016 she has been feeling even more sadness. Patient denies any thoughts of SI/HI.   Social Worker assessment / plan:  CSW and psychiatrist met with patient at bedside. Patient alert and oriented and agreeable to talk. Patient reports she has been feeling depressed and has been taking xanax for two years and was recently prescribed zoloft medications by her primary care physician. She is not sure if the medications "have started working." Patient reports she went to counseling at her church for sometime and then stopped when she felt she had accepted her spouses death. Patient reports she has a strong support system, "My two sons and seven grandchildren have been very helpful."  Patient reports at this time she is open  to see a psychiatrist outptient for medication management counseling. CSW provided patient with list of outpatient psychiatry services. Patient reports she prefers to see a psychiatrist affiliated with [facility]. CSW made appointment with [facility] outpatient services, appointment  September 08, 2016 at 10:00am with Dr. Adele Schilder. CSW offered to find another services with closer date, patient reports, " I am okay with the date, I want to see how the medication works first." CSW put information on AVS- provider follow up.   Plan: Patient to return home and follow up with outpatient provider.   Employment status:  Retired Nurse, adult PT Recommendations:  Not assessed at this time Information / Referral to community resources:  Outpatient Psychiatric Care (Comment Required)  Patient/Family's Response to care: No family at bedside during the time of assessment, patient reports her son has been at bedside when not working.  Patient appreciative of CSW services.   Patient/Family's Understanding of and Emotional Response to Diagnosis, Current Treatment, and Prognosis: " I am hopeful the new medications will help with my depression."  Patient was agreeable to start new medications prescribed by psychiatrist for depression and pain in her knee and back.   Emotional Assessment Appearance:  Developmentally appropriate Attitude/Demeanor/Rapport:    Affect (typically observed):  Accepting, Pleasant, Depressed Orientation:  Oriented to Self, Oriented to Place, Oriented to  Time, Oriented to Situation Alcohol / Substance use:  Not Applicable Psych involvement (Current and /or in the community):  No (Comment)  Discharge Needs  Concerns to be addressed:  Discharge Planning Concerns, Care Coordination Readmission within the last 30 days:  No Current discharge risk:  None Barriers to Discharge:  Continued Medical Work up   [employer], LCSW 07/14/2016, 2:22  PM

## 2016-07-15 ENCOUNTER — Observation Stay (HOSPITAL_COMMUNITY): Payer: Medicare Other

## 2016-07-15 DIAGNOSIS — F332 Major depressive disorder, recurrent severe without psychotic features: Secondary | ICD-10-CM | POA: Diagnosis not present

## 2016-07-15 LAB — CBC WITH DIFFERENTIAL/PLATELET
BASOS ABS: 0 10*3/uL (ref 0.0–0.1)
Basophils Relative: 0 %
Eosinophils Absolute: 0.1 10*3/uL (ref 0.0–0.7)
Eosinophils Relative: 4 %
HCT: 33.3 % — ABNORMAL LOW (ref 36.0–46.0)
HEMOGLOBIN: 10.9 g/dL — AB (ref 12.0–15.0)
LYMPHS ABS: 0.6 10*3/uL — AB (ref 0.7–4.0)
LYMPHS PCT: 18 %
MCH: 31.1 pg (ref 26.0–34.0)
MCHC: 32.7 g/dL (ref 30.0–36.0)
MCV: 95.1 fL (ref 78.0–100.0)
Monocytes Absolute: 0.4 10*3/uL (ref 0.1–1.0)
Monocytes Relative: 12 %
NEUTROS PCT: 66 %
Neutro Abs: 2.2 10*3/uL (ref 1.7–7.7)
Platelets: 187 10*3/uL (ref 150–400)
RBC: 3.5 MIL/uL — AB (ref 3.87–5.11)
RDW: 14.5 % (ref 11.5–15.5)
WBC: 3.3 10*3/uL — AB (ref 4.0–10.5)

## 2016-07-15 MED ORDER — MIRTAZAPINE 7.5 MG PO TABS: 7.5000 mg | ORAL_TABLET | Freq: Every day | ORAL | 0 refills | Status: DC

## 2016-07-15 MED ORDER — CLONAZEPAM 0.5 MG PO TABS: 0.5000 mg | ORAL_TABLET | Freq: Two times a day (BID) | ORAL | 0 refills | Status: DC

## 2016-07-15 MED ORDER — GABAPENTIN 100 MG PO CAPS: 100.0000 mg | ORAL_CAPSULE | Freq: Two times a day (BID) | ORAL | 0 refills | Status: DC

## 2016-07-15 MED ORDER — DULOXETINE HCL 30 MG PO CPEP: 30.0000 mg | ORAL_CAPSULE | Freq: Every day | ORAL | 0 refills | Status: DC

## 2016-07-15 MED ORDER — GABAPENTIN 100 MG PO CAPS
100.0000 mg | ORAL_CAPSULE | Freq: Two times a day (BID) | ORAL | Status: DC
  Administered 2016-07-15: 100 mg via ORAL
  Filled 2016-07-15: qty 1

## 2016-07-15 MED ORDER — ALPRAZOLAM 1 MG PO TABS: 0.5000 mg | ORAL_TABLET | Freq: Two times a day (BID) | ORAL | 0 refills | Status: DC | PRN

## 2016-07-15 NOTE — Discharge Summary (Addendum)
Physician Discharge Summary  Sandy Hodges:[id number] DOB: 1945-02-06 DOA: 07/13/2016  PCP: Sandy Palmer, MD  Admit date: 07/13/2016 Discharge date: 07/15/2016  Time spent: 40 minutes  Recommendations for Outpatient Follow-up:  1. Patient has been discharged with multiple new psychotropic medications for situational depression and pathologic grief-recommend close follow-up as an out patient psychiatrist 2. We have resumed home health services PT OT 3. 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 and should have follow-up as an outpatient regarding this  Discharge Diagnoses:  Principal Problem:   MDD (major depressive disorder), recurrent severe, without psychosis (HCC) Active Problems:   Abdominal pain   Nausea   Anxiety and depression   Prolonged grief reaction   Discharge Condition: good  Diet recommendation: soft  Filed Weights   07/13/16 1422  Weight: 66.1 kg (145 lb 11.2 oz)    History of present illness:  71 Known history of fibromyalgia Bipolar Osteoarthritis Hypertension admitted 07/13/2016 with epigastric pain 8/10 intensity or appetite started after taking NSAIDs as well as oxycodone for her recent surgery 06/24/2016 Patient has not really been able to eat or drink has lost interest in many inks and has lost 3-4 pounds and was seen 07/10/2016 with similar complaints and sent home because workup was negative-workup on admission was also negative and GI saw the patient in consult   Hospital Course:   # Persistent left upper quadrant/epigastric pain :   functional dyspepsia.  CT scan of abdomen and pelvis done few days ago showed scattered colon diverticula without any acute finding.  Labs including lipase level unremarkable.  Patient with anxiety depression and has crying spells.  Unknown if her symptoms are exacerbated by her anxiety/depression. -Already evaluated by GI, ordered a small bowel series  which was negative--t by discharge most severe issues had resolved and patient has had 2 meals without significant issue -Continue PPI, MiraLAX and supportive care.  #Hypertension: Continue atenolol, lisinopril/hctz.monitor BP  # Bipolar  Patient reported depression and having crying spells. Patient's son at bedside.  Patient reported that her symptoms worsened after her husband passed away last 01/09/23.  She was recently started on Zoloft.  On this admission multiple new meds restarted namely Cymbalta 30, gabapentin 100 twice a day (kidney function is little bit off so not 3 times a day), mirtazapine 7.5 at bedtime and clonazepam was started 0.5 daily for anxiety in addition to continuing twice daily Xanax as needed for panic episodes She should follow closely with an outpatient psychiatrist  #Osteoarthritis of Knees, s/p recent surgery: Pain management and supportive care. PT OT evaluation  #Hyperlipidemia: Continue statin  #anemia of chronic disease: Continue hemoglobin monitoring as an outpatient  Consultations:  Psychiatry  Discharge Exam: Vitals:   07/14/16 2057 07/15/16 0520  BP: (!) 156/71 140/67  Pulse: 60 63  Resp: 16 16  Temp: 98.3 F (36.8 C) 97.7 F (36.5 C)    General: EOMI NCAT  Cardiovascular: S1-S2 no murmur rub or gallop Respiratory: Clinically clear no added sounds  Discharge Instructions   Discharge Instructions    Diet - low sodium heart healthy    Complete by:  As directed    Discharge instructions    Complete by:  As directed    Continue soft diet for the time being Gradually his diet slowly and do not overdo it Please notice multiple medications have changed or increased and you shouldn't get follow-up as an outpatient with his psychiatrist   Increase  activity slowly    Complete by:  As directed      Current Discharge Medication List    START taking these medications   Details  clonazePAM (KLONOPIN) 0.5 MG tablet Take 1 tablet (0.5  mg total) by mouth 2 (two) times daily. Take if you are having a panic attack spell--note that this is similar to ativan--follo with psychiatrist Qty: 30 tablet, Refills: 0    DULoxetine (CYMBALTA) 30 MG capsule Take 1 capsule (30 mg total) by mouth daily. Qty: 30 capsule, Refills: 0    gabapentin (NEURONTIN) 100 MG capsule Take 1 capsule (100 mg total) by mouth 2 (two) times daily. Qty: 60 capsule, Refills: 0    mirtazapine (REMERON) 7.5 MG tablet Take 1 tablet (7.5 mg total) by mouth at bedtime. Qty: 30 tablet, Refills: 0      CONTINUE these medications which have CHANGED   Details  ALPRAZolam (XANAX) 1 MG tablet Take 0.5-1 tablets (0.5-1 mg total) by mouth 2 (two) times daily as needed for anxiety. 0.5 mg daily and 1 mg at bedtime. Qty: 30 tablet, Refills: 0      CONTINUE these medications which have NOT CHANGED   Details  acetaminophen (TYLENOL) 500 MG tablet Take 1,000 mg by mouth 2 (two) times daily as needed (for pain.).    apixaban (ELIQUIS) 2.5 MG TABS tablet Take 1 tablet (2.5 mg total) by mouth 2 (two) times daily. Qty: 24 tablet, Refills: 0    aspirin EC 81 MG tablet Take 81 mg by mouth daily.    atenolol (TENORMIN) 25 MG tablet Take 50 mg by mouth every evening. Refills: 0    Calcium Carbonate-Vit D-Min (CALCIUM 600+D PLUS MINERALS PO) Take 1 tablet by mouth 2 (two) times daily.    fenofibrate (TRICOR) 145 MG tablet Take 145 mg by mouth every evening.    Associated Diagnoses: Pancytopenia (HCC)    ferrous sulfate 325 (65 FE) MG tablet Take 325 mg by mouth See admin instructions. Take 2-3 times weekly due to side effects    fish oil-omega-3 fatty acids 1000 MG capsule Take 2 g by mouth 2 (two) times daily.    Associated Diagnoses: Pancytopenia (HCC)    lisinopril-hydrochlorothiazide (PRINZIDE,ZESTORETIC) 20-12.5 MG per tablet Take 1 tablet by mouth every evening.  Refills: 0    loratadine (CLARITIN) 10 MG tablet Take 10 mg by mouth 2 (two) times daily.     Associated Diagnoses: Pancytopenia (HCC)    Multiple Vitamin (MULTIVITAMIN WITH MINERALS) TABS tablet Take 1 tablet by mouth every evening. One-A-Day Women 50+    omeprazole (PRILOSEC) 20 MG capsule Take 1 capsule (20 mg total) by mouth daily. Qty: 14 capsule, Refills: 0    ondansetron (ZOFRAN) 4 MG tablet Take 1 tablet (4 mg total) by mouth every 8 (eight) hours as needed for nausea or vomiting. Qty: 30 tablet, Refills: 0    Probiotic Product (PROBIOTIC PO) Take 1 capsule by mouth every evening.    simvastatin (ZOCOR) 20 MG tablet Take 20 mg by mouth every evening.    Associated Diagnoses: Pancytopenia (HCC)    sucralfate (CARAFATE) 1 GM/10ML suspension Take 10 mLs (1 g total) by mouth 4 (four) times daily -  with meals and at bedtime. Qty: 420 mL, Refills: 0      STOP taking these medications     Cholecalciferol (VITAMIN D-3) 1000 UNITS CAPS      docusate sodium (COLACE) 100 MG capsule      ibuprofen (ADVIL,MOTRIN) 200 MG tablet  oxyCODONE-acetaminophen (ROXICET) 5-325 MG tablet      sertraline (ZOLOFT) 50 MG tablet        Allergies  Allergen Reactions  . No Known Allergies    Follow-up Information    [facility] [place]. Go on 09/08/2016.   Why:  Please arrive at [facility] card with you.  Your appointment is with : Dr. Lolly Mustache.  Contact information: [address] [place], [place] [address]       [facility].   Specialty:  Home Health Services Why:  [facility] physical/occupational therapy Contact information: [address] [place] [address] [place] [address] [phone]            The results of significant diagnostics from this hospitalization (including imaging, microbiology, ancillary and laboratory) are listed below for reference.    Significant Diagnostic Studies: Ct Abdomen Pelvis W Contrast  Result Date: 07/10/2016 CLINICAL DATA:  c/o of left sided abdominal pain that began about 4  days ago. Pt states she has upper left sided abdominal pain. Pt states she has had excess gas, and LBM was yesterday. Pt states she has been taking oxycodone for a knee surgery on 6/8. Patient also c/o constipation. EXAM: CT ABDOMEN AND PELVIS WITH CONTRAST TECHNIQUE: Multidetector CT imaging of the abdomen and pelvis was performed using the standard protocol following bolus administration of intravenous contrast. CONTRAST:  ISOVUE-300 IOPAMIDOL (ISOVUE-300) INJECTION 61% COMPARISON:  07/10/2013 FINDINGS: Lower chest: No acute abnormality. Hepatobiliary: No focal liver abnormality is seen. No gallstones, gallbladder wall thickening, or biliary dilatation.Wall Pancreas: Unremarkable. No pancreatic ductal dilatation or surrounding inflammatory changes. Spleen: Spleen is normal in size. 17 mm low-density lesion arises from the inferior aspect of the spleen, likely cysts and stable from the prior exam. No other splenic abnormality. Adrenals/Urinary Tract: No adrenal masses. Bilateral renal sinus cysts. Bilateral renal cortical thinning. Small midpole left renal cortical cyst. Stable appearance from the prior study. No hydronephrosis. Ureters normal course and caliber. Bladder is unremarkable. Stomach/Bowel: Stomach and small bowel unremarkable. There are scattered left colon diverticula mostly along the sigmoid. No diverticulitis. No other colon abnormality. No significant increase in colonic stool. Normal appendix visualized. Vascular/Lymphatic: Aortic atherosclerosis. No enlarged abdominal or pelvic lymph nodes. Reproductive: 2.1 cm uterine fundal fibroid. Uterus otherwise unremarkable. No adnexal masses. Other: No abdominal wall hernia or abnormality. No abdominopelvic ascites. Musculoskeletal: No fracture or acute finding. No osteoblastic or osteolytic lesions. IMPRESSION: 1. No acute findings. No findings to account for the patient's symptoms. 2. No significant increase in stool. 3. Stable renal sinus cysts.  4. Scattered left colon diverticula without diverticulitis. 5. Aortic atherosclerosis. Electronically Signed   By: Amie Portland M.D.   On: 07/10/2016 17:24   Dg Abdomen Acute W/chest  Result Date: 07/13/2016 CLINICAL DATA:  Abdominal pain for several weeks, worsened over the past few days. EXAM: DG ABDOMEN ACUTE W/ 1V CHEST COMPARISON:  CT 07/10/2016 FINDINGS: There is no evidence of dilated bowel loops or free intraperitoneal air. No radiopaque calculi or other significant radiographic abnormality is seen. Heart size and mediastinal contours are within normal limits. Both lungs are clear. IMPRESSION: Negative abdominal radiographs.  No acute cardiopulmonary disease. Electronically Signed   By: Ellery Plunk M.D.   On: 07/13/2016 05:04   Dg Kayleen Memos W/small Bowel  Result Date: 07/15/2016 CLINICAL DATA:  Epigastric pain and some constipation for 6 months. EXAM: UPPER GI SERIES WITH SMALL BOWEL FOLLOW-THROUGH FLUOROSCOPY TIME:  Fluoroscopy Time:  2 minutes and 49 seconds 62.05 mGy TECHNIQUE: Combined double contrast and single contrast upper GI series using effervescent crystals, thick barium, and thin barium. Subsequently, serial images of the small bowel were obtained including spot views of the terminal ileum. COMPARISON:  None. FINDINGS: Scout KUB was obtained showing a nonobstructive bowel gas pattern. No evidence of soft tissue mass or abnormal fluid collection. No evidence of free intraperitoneal air. Degenerative changes are seen throughout the scoliotic thoracolumbar spine, moderate in degree. Atherosclerotic changes noted at the aortic arch. Serial oral boluses of thick and thin consistency barium liquid were administered during fluoroscopic evaluation. Thoracic esophagus appeared normal in caliber and configuration without mass, ulceration or other focal wall irregularity. Contrast moved promptly through the esophagus and into the stomach without obstruction or evidence of dysmotility. Stomach  appeared normal in caliber and configuration without mass, ulceration or other focal wall irregularity. No hiatal hernia elicited despite Valsalva maneuvers. Contrast moved promptly from the stomach into the small bowel without obstruction or evidence of dysmotility. Small bowel appeared normal in caliber and configuration throughout. Fold pattern appeared normal throughout without mass, ulceration or other focal wall irregularity identified. Contrast moved promptly to the colon within 40 minutes with no obstruction or evidence of dysmotility. IMPRESSION: Normal upper GI and small-bowel follow-through examination. Aortic atherosclerosis. Degenerative changes of the scoliotic thoracolumbar spine, moderate in degree. Electronically Signed   By: Bary RichardStan  Maynard M.D.   On: 07/15/2016 10:58    Microbiology: No results found for this or any previous visit (from the past 240 hour(s)).   Labs: Basic Metabolic Panel:  Recent Labs Lab 07/10/16 1538 07/12/16 2008 07/13/16 1022 07/14/16 0524  NA 135 132*  --  138  K 4.5 4.2  --  4.0  CL 102 99*  --  104  CO2 25 24  --  24  GLUCOSE 113* 107*  --  93  BUN 22* 15  --  20  CREATININE 1.09* 0.90 0.82 0.99  CALCIUM 10.0 9.8  --  9.7   Liver Function Tests:  Recent Labs Lab 07/10/16 1538 07/12/16 2008 07/14/16 0524  AST 19 19 19   ALT 13* 13* 11*  ALKPHOS 31* 33* 31*  BILITOT 0.6 0.7 0.6  PROT 6.8 6.7 6.1*  ALBUMIN 4.1 4.0 3.5    Recent Labs Lab 07/10/16 1538 07/12/16 2008  LIPASE 43 57*   No results for input(s): AMMONIA in the last 168 hours. CBC:  Recent Labs Lab 07/10/16 1538 07/12/16 2008 07/13/16 1022 07/15/16 1016  WBC 3.3* 3.0* 2.5* 3.3*  NEUTROABS  --   --   --  2.2  HGB 10.6* 10.6* 10.8* 10.9*  HCT 32.6* 32.2* 33.2* 33.3*  MCV 95.3 94.2 93.8 95.1  PLT 234 233 211 187   Cardiac Enzymes:  Recent Labs Lab 07/13/16 1022 07/13/16 1431 07/13/16 2057  TROPONINI <0.03 <0.03 <0.03   BNP: BNP (last 3 results) No  results for input(s): BNP in the last 8760 hours.  ProBNP (last 3 results) No results for input(s): PROBNP in the last 8760 hours.  CBG: No results for input(s): GLUCAP in the last 168 hours.     SignedRhetta Mura:  Kyree Fedorko, JAI-GURMUKH MD   [facility] 07/15/2016, 2:04 PM

## 2016-07-15 NOTE — Care Management Note (Signed)
Case Management Note  Patient Details  Name: Sandy Hodges MRN: [id number] Date of Birth: 1945-08-14  Subjective/Objective:  [facility] offered choice-[facility] prior for L [facility]- [facility] rep Lupita LeashDonna aware of orders, & d/c today. No further CM needs.                  Action/Plan:d/c home w/HHC.   Expected Discharge Date:  07/14/16               Expected Discharge Plan:  Home w Home Health Services  In-House Referral:     Discharge planning Services  CM Consult  Post Acute Care Choice:  Home Health ([facility] HH[facility] Choice offered to:  Patient  DME Arranged:    DME Agency:     HH Arranged:  PT, OT HH Agency:  [facility] ([facility]  Status of Service:  Completed, signed off  If discussed at [employer] Meetings, dates discussed:    Additional Comments:  Lanier ClamMahabir, Irene Collings, RN 07/15/2016, 2:01 PM

## 2016-08-19 ENCOUNTER — Emergency Department (HOSPITAL_COMMUNITY)
Admission: EM | Admit: 2016-08-19 | Discharge: 2016-08-19 | Disposition: A | Discharge status: Other Acute Inpatient Hospital | Payer: Medicare Other | Attending: Emergency Medicine | Admitting: Emergency Medicine

## 2016-08-19 ENCOUNTER — Inpatient Hospital Stay (HOSPITAL_COMMUNITY)
Admission: RE | Admit: 2016-08-19 | Discharge: 2016-08-25 | DRG: 885 | Disposition: A | Discharge status: Home / Self Care | Payer: Medicare Other | Attending: Psychiatry | Admitting: Psychiatry

## 2016-08-19 ENCOUNTER — Emergency Department (HOSPITAL_COMMUNITY): Payer: Medicare Other

## 2016-08-19 ENCOUNTER — Encounter (HOSPITAL_COMMUNITY): Payer: Self-pay | Admitting: Emergency Medicine

## 2016-08-19 ENCOUNTER — Encounter (HOSPITAL_COMMUNITY): Payer: Self-pay | Admitting: *Deleted

## 2016-08-19 DIAGNOSIS — F410 Panic disorder [episodic paroxysmal anxiety] without agoraphobia: Secondary | ICD-10-CM | POA: Diagnosis present

## 2016-08-19 DIAGNOSIS — Z96653 Presence of artificial knee joint, bilateral: Secondary | ICD-10-CM | POA: Diagnosis present

## 2016-08-19 DIAGNOSIS — Z7982 Long term (current) use of aspirin: Secondary | ICD-10-CM | POA: Diagnosis not present

## 2016-08-19 DIAGNOSIS — J301 Allergic rhinitis due to pollen: Secondary | ICD-10-CM | POA: Diagnosis present

## 2016-08-19 DIAGNOSIS — M797 Fibromyalgia: Secondary | ICD-10-CM | POA: Diagnosis present

## 2016-08-19 DIAGNOSIS — Z634 Disappearance and death of family member: Secondary | ICD-10-CM | POA: Diagnosis not present

## 2016-08-19 DIAGNOSIS — I10 Essential (primary) hypertension: Secondary | ICD-10-CM | POA: Diagnosis present

## 2016-08-19 DIAGNOSIS — F32A Depression, unspecified: Secondary | ICD-10-CM

## 2016-08-19 DIAGNOSIS — Z79899 Other long term (current) drug therapy: Secondary | ICD-10-CM | POA: Diagnosis not present

## 2016-08-19 DIAGNOSIS — F332 Major depressive disorder, recurrent severe without psychotic features: Secondary | ICD-10-CM | POA: Diagnosis present

## 2016-08-19 DIAGNOSIS — F329 Major depressive disorder, single episode, unspecified: Secondary | ICD-10-CM | POA: Diagnosis present

## 2016-08-19 DIAGNOSIS — G4700 Insomnia, unspecified: Secondary | ICD-10-CM | POA: Diagnosis not present

## 2016-08-19 DIAGNOSIS — G479 Sleep disorder, unspecified: Secondary | ICD-10-CM | POA: Diagnosis present

## 2016-08-19 DIAGNOSIS — K219 Gastro-esophageal reflux disease without esophagitis: Secondary | ICD-10-CM | POA: Diagnosis present

## 2016-08-19 DIAGNOSIS — Z8249 Family history of ischemic heart disease and other diseases of the circulatory system: Secondary | ICD-10-CM

## 2016-08-19 DIAGNOSIS — F419 Anxiety disorder, unspecified: Secondary | ICD-10-CM | POA: Diagnosis not present

## 2016-08-19 LAB — RAPID URINE DRUG SCREEN, HOSP PERFORMED
Amphetamines: NOT DETECTED
BARBITURATES: NOT DETECTED
BENZODIAZEPINES: POSITIVE — AB
Cocaine: NOT DETECTED
Opiates: NOT DETECTED
Tetrahydrocannabinol: NOT DETECTED

## 2016-08-19 LAB — CBC WITH DIFFERENTIAL/PLATELET
BASOS ABS: 0 10*3/uL (ref 0.0–0.1)
Basophils Relative: 1 %
EOS ABS: 0.1 10*3/uL (ref 0.0–0.7)
EOS PCT: 1 %
HCT: 36 % (ref 36.0–46.0)
HEMOGLOBIN: 11.9 g/dL — AB (ref 12.0–15.0)
LYMPHS PCT: 25 %
Lymphs Abs: 0.9 10*3/uL (ref 0.7–4.0)
MCH: 30.1 pg (ref 26.0–34.0)
MCHC: 33.1 g/dL (ref 30.0–36.0)
MCV: 91.1 fL (ref 78.0–100.0)
Monocytes Absolute: 0.2 10*3/uL (ref 0.1–1.0)
Monocytes Relative: 6 %
NEUTROS PCT: 67 %
Neutro Abs: 2.3 10*3/uL (ref 1.7–7.7)
PLATELETS: 130 10*3/uL — AB (ref 150–400)
RBC: 3.95 MIL/uL (ref 3.87–5.11)
RDW: 12.3 % (ref 11.5–15.5)
WBC: 3.5 10*3/uL — AB (ref 4.0–10.5)

## 2016-08-19 LAB — COMPREHENSIVE METABOLIC PANEL
ALBUMIN: 4.3 g/dL (ref 3.5–5.0)
ALK PHOS: 28 U/L — AB (ref 38–126)
ALT: 14 U/L (ref 14–54)
AST: 21 U/L (ref 15–41)
Anion gap: 9 (ref 5–15)
BILIRUBIN TOTAL: 0.5 mg/dL (ref 0.3–1.2)
BUN: 16 mg/dL (ref 6–20)
CALCIUM: 10.3 mg/dL (ref 8.9–10.3)
CO2: 26 mmol/L (ref 22–32)
CREATININE: 0.87 mg/dL (ref 0.44–1.00)
Chloride: 100 mmol/L — ABNORMAL LOW (ref 101–111)
GFR calc Af Amer: >60 mL/min (ref >60)
GLUCOSE: 112 mg/dL — AB (ref 65–99)
POTASSIUM: 3.7 mmol/L (ref 3.5–5.1)
Sodium: 135 mmol/L (ref 135–145)
TOTAL PROTEIN: 7.5 g/dL (ref 6.5–8.1)

## 2016-08-19 LAB — URINALYSIS, ROUTINE W REFLEX MICROSCOPIC
Bacteria, UA: NONE SEEN
Bilirubin Urine: NEGATIVE
GLUCOSE, UA: NEGATIVE mg/dL
HGB URINE DIPSTICK: NEGATIVE
KETONES UR: NEGATIVE mg/dL
NITRITE: NEGATIVE
PROTEIN: NEGATIVE mg/dL
Specific Gravity, Urine: 1.009 (ref 1.005–1.030)
pH: 6 (ref 5.0–8.0)

## 2016-08-19 LAB — ETHANOL: Alcohol, Ethyl (B): <5 mg/dL (ref <5)

## 2016-08-19 MED ORDER — MAGNESIUM HYDROXIDE 400 MG/5ML PO SUSP
30.0000 mL | Freq: Every day | ORAL | Status: DC | PRN
  Administered 2016-08-22: 30 mL via ORAL
  Filled 2016-08-19: qty 30

## 2016-08-19 MED ORDER — LISINOPRIL 20 MG PO TABS
20.0000 mg | ORAL_TABLET | Freq: Every day | ORAL | Status: DC
  Filled 2016-08-19: qty 1

## 2016-08-19 MED ORDER — DULOXETINE HCL 30 MG PO CPEP
30.0000 mg | ORAL_CAPSULE | Freq: Every day | ORAL | Status: DC
  Administered 2016-08-20 – 2016-08-21 (×2): 30 mg via ORAL
  Filled 2016-08-19 (×3): qty 1

## 2016-08-19 MED ORDER — DULOXETINE HCL 30 MG PO CPEP
30.0000 mg | ORAL_CAPSULE | Freq: Every day | ORAL | Status: DC
  Administered 2016-08-19: 30 mg via ORAL
  Filled 2016-08-19: qty 1

## 2016-08-19 MED ORDER — PANTOPRAZOLE SODIUM 40 MG PO TBEC
40.0000 mg | DELAYED_RELEASE_TABLET | Freq: Every day | ORAL | Status: DC
  Administered 2016-08-19: 40 mg via ORAL
  Filled 2016-08-19: qty 1

## 2016-08-19 MED ORDER — HYDROXYZINE HCL 25 MG PO TABS
25.0000 mg | ORAL_TABLET | Freq: Three times a day (TID) | ORAL | Status: DC | PRN
  Administered 2016-08-20 – 2016-08-22 (×2): 25 mg via ORAL
  Filled 2016-08-19 (×2): qty 1

## 2016-08-19 MED ORDER — MIRTAZAPINE 7.5 MG PO TABS: 7.5000 mg | ORAL_TABLET | Freq: Every day | ORAL | Status: DC

## 2016-08-19 MED ORDER — ATENOLOL 50 MG PO TABS
50.0000 mg | ORAL_TABLET | Freq: Every evening | ORAL | Status: DC
  Filled 2016-08-19: qty 1

## 2016-08-19 MED ORDER — HYDROCHLOROTHIAZIDE 12.5 MG PO CAPS
12.5000 mg | ORAL_CAPSULE | Freq: Every day | ORAL | Status: DC
  Filled 2016-08-19: qty 1

## 2016-08-19 MED ORDER — GABAPENTIN 100 MG PO CAPS
100.0000 mg | ORAL_CAPSULE | Freq: Two times a day (BID) | ORAL | Status: DC
  Administered 2016-08-19: 100 mg via ORAL
  Filled 2016-08-19: qty 1

## 2016-08-19 MED ORDER — SIMVASTATIN 20 MG PO TABS
20.0000 mg | ORAL_TABLET | Freq: Every evening | ORAL | Status: DC
  Filled 2016-08-19: qty 1

## 2016-08-19 MED ORDER — FENOFIBRATE 160 MG PO TABS
160.0000 mg | ORAL_TABLET | Freq: Every day | ORAL | Status: DC
  Filled 2016-08-19: qty 1

## 2016-08-19 MED ORDER — LORATADINE 10 MG PO TABS
10.0000 mg | ORAL_TABLET | Freq: Two times a day (BID) | ORAL | Status: DC
  Filled 2016-08-19: qty 1

## 2016-08-19 MED ORDER — ALPRAZOLAM 0.5 MG PO TABS
0.5000 mg | ORAL_TABLET | Freq: Two times a day (BID) | ORAL | Status: DC | PRN
  Administered 2016-08-19: 1 mg via ORAL
  Filled 2016-08-19: qty 2

## 2016-08-19 MED ORDER — ALPRAZOLAM 0.5 MG PO TABS
0.5000 mg | ORAL_TABLET | Freq: Two times a day (BID) | ORAL | Status: DC | PRN
  Administered 2016-08-20 – 2016-08-21 (×3): 1 mg via ORAL
  Filled 2016-08-19 (×3): qty 2

## 2016-08-19 MED ORDER — ASPIRIN EC 81 MG PO TBEC
81.0000 mg | DELAYED_RELEASE_TABLET | Freq: Every day | ORAL | Status: DC
  Filled 2016-08-19: qty 1

## 2016-08-19 MED ORDER — TRAZODONE HCL 50 MG PO TABS
50.0000 mg | ORAL_TABLET | Freq: Every evening | ORAL | Status: DC | PRN
  Administered 2016-08-19 – 2016-08-21 (×3): 50 mg via ORAL
  Filled 2016-08-19 (×3): qty 1

## 2016-08-19 MED ORDER — APIXABAN 2.5 MG PO TABS: 2.5000 mg | ORAL_TABLET | Freq: Two times a day (BID) | ORAL | Status: DC

## 2016-08-19 MED ORDER — ACETAMINOPHEN 325 MG PO TABS
650.0000 mg | ORAL_TABLET | Freq: Four times a day (QID) | ORAL | Status: DC | PRN
  Administered 2016-08-19 – 2016-08-24 (×5): 650 mg via ORAL
  Filled 2016-08-19 (×5): qty 2

## 2016-08-19 MED ORDER — LISINOPRIL-HYDROCHLOROTHIAZIDE 20-12.5 MG PO TABS: 1.0000 | ORAL_TABLET | Freq: Every evening | ORAL | Status: DC

## 2016-08-19 MED ORDER — ALUM & MAG HYDROXIDE-SIMETH 200-200-20 MG/5ML PO SUSP
30.0000 mL | ORAL | Status: DC | PRN
  Administered 2016-08-20: 30 mL via ORAL
  Filled 2016-08-19: qty 30

## 2016-08-19 MED ORDER — ATENOLOL 50 MG PO TABS
50.0000 mg | ORAL_TABLET | Freq: Every evening | ORAL | Status: DC
  Administered 2016-08-19 – 2016-08-24 (×6): 50 mg via ORAL
  Filled 2016-08-19 (×2): qty 1
  Filled 2016-08-19: qty 2
  Filled 2016-08-19 (×2): qty 1
  Filled 2016-08-19: qty 2
  Filled 2016-08-19 (×3): qty 1

## 2016-08-19 NOTE — ED Triage Notes (Signed)
Patient from behavioral health who needs to be medically cleared.  She reports that her anxiety and depression has worsened recently, having lost her husband in December and she is not sleeping or eating well.  Her friend is visiting and reports she does have a bed at behavioral health.

## 2016-08-19 NOTE — H&P (Signed)
Behavioral Health Medical Screening Exam  Sandy Hodges is an 71 y.o. female [facility]. Pt recently lost her husband, her sister, and underwent a knee replacement surgery. Pt stated she fell into a deep depression after her surgery and would rather not live this way. Pt has access to weapons and lives alone. Pt appears extremely depressed and has been experiencing debilitating panic attacks.   Total Time spent with patient: 30 minutes  Psychiatric Specialty Exam: Physical Exam  Constitutional: She is oriented to person, place, and time. She appears well-developed and well-nourished.  HENT:  Head: Normocephalic.  Right Ear: External ear normal.  Left Ear: External ear normal.  Neck: Normal range of motion.  Cardiovascular: Normal rate, normal heart sounds and intact distal pulses.   Respiratory: Effort normal.  GI: Soft.  Musculoskeletal: Normal range of motion.  Neurological: She is alert and oriented to person, place, and time.  Skin: Skin is warm and dry.    Review of Systems  Psychiatric/Behavioral: Positive for depression and suicidal ideas. Negative for hallucinations, memory loss and substance abuse. The patient is nervous/anxious. The patient does not have insomnia.   All other systems reviewed and are negative.   Blood pressure 137/79, pulse 72, temperature 98.3 F (36.8 C), temperature source Oral, resp. rate 16, SpO2 100 %.There is no height or weight on file to calculate BMI.  General Appearance: Casual and Fairly Groomed  Eye Contact:  Fair  Speech:  Slow  Volume:  Decreased  Mood:  Anxious, Depressed and Dysphoric  Affect:  Congruent, Depressed and Flat  Thought Process:  Coherent and Linear  Orientation:  Full (Time, Place, and Person)  Thought Content:  Logical  Suicidal Thoughts:  Yes.  without intent/plan  Homicidal Thoughts:  No  Memory:  Immediate;   Good Recent;   Good Remote;   Fair  Judgement:  Fair  Insight:  Fair   Psychomotor Activity:  Normal  Concentration: Concentration: Good and Attention Span: Good  Recall:  Good  Fund of Knowledge:Good  Language: Good  Akathisia:  No  Handed:  Right  AIMS (if indicated):     Assets:  Communication Skills Desire for Improvement Financial Resources/Insurance Housing Resilience Social Support Transportation  Sleep:       Musculoskeletal: Strength & Muscle Tone: within normal limits Gait & Station: normal Patient leans: N/A  Blood pressure 137/79, pulse 72, temperature 98.3 F (36.8 C), temperature source Oral, resp. rate 16, SpO2 100 %.  Recommendations:  Based on my evaluation the patient does not appear to have an emergency medical condition. Recommend gero-psychiatric inpatient admission when medically cleared.  Laveda AbbeLaurie Britton Zoeie Ritter, NP 08/19/2016, 3:46 PM

## 2016-08-19 NOTE — ED Notes (Signed)
Pt son is at bedside. Pt is cooperative at time she is tearful and crying with son. [facility]. Report given to 400, and [facility] has been made contacted for transport.

## 2016-08-19 NOTE — Progress Notes (Signed)
Admission Note:  71 year old female who presents voluntary, in no acute distress, for the treatment of Depression and Anxiety. Patient appears flat and depressed. Patient was calm and cooperative with admission process. Patient currently denies SI and contracts for safety upon admission. Patient denies AVH.  Patient reports that she has been experiencing increased anxiety and depression recently.  Patient reports that her husband died in December and her sister died in May. Today was her sister's birthday who past away in May, which was patient's trigger.  Additionally, patient reports frequent panic attacks and decreased appetite.  Patient attributes her decrease in appetite to her depression and antidepressants stating "I don't think my medicines are doing any good".  Patient lives alone and verbalizes a strong family support system.  [facility], patient is "Hoping to get on something that work" referring to medications and "cope with anxiety and depression".  Skin was assessed and found to be clear of any abnormal marks.  Patient searched and no contraband found, POC and unit policies explained and understanding verbalized. Consents obtained. Food and fluids offered and fluids accepted. Patient had no additional questions or concerns.

## 2016-08-19 NOTE — ED Notes (Signed)
Pt belongs purse, phone, wallet and jewelery has been sent home with [name], Pt verbalized consent.

## 2016-08-19 NOTE — ED Provider Notes (Signed)
[facility]-EMERGENCY DEPT Provider Note   CSN: [id number] Arrival date & time: 08/19/16  1606     History   Chief Complaint Chief Complaint  Patient presents with  . anxiety, depression    HPI Sandy Hodges is a 71 y.o. female.  HPI Patient presents with anxiety and depression. Sent from behavioral health for medical clearance. Inpatient treatment as recommended per patient's family member they have a bed at behavioral health for her. Patient's family member states she was only sent here to get lab work. Patient states she feels bad. Has had a recent death of her husband. Has had a decreased appetite. States she's mostly been drinking Boost. Past Medical History:  Diagnosis Date  . Abdominal pain, unspecified site 07/09/2013  . Anxiety   . Arthritis 11/14/2013  . Back pain 07/09/2013  . Blood dyscrasia    Pancytopenial; [facility] Dr. Bertis RuddyGorsuch  . Depression 10/16/2014  . Essential hypertension 11/14/2013  . Fatigue 07/09/2013  . Fibromyalgia   . Hyperlipidemia   . Hypertension   . Knee pain 07/09/2013  . OA (osteoarthritis) of knee   . Pancytopenia (HCC) 12/14/2011  . Pancytopenia (HCC)   . PONV (postoperative nausea and vomiting)    nausea an vomiting  . Unspecified deficiency anemia 07/09/2013    Patient Active Problem List   Diagnosis Date Noted  . MDD (major depressive disorder), recurrent severe, without psychosis (HCC) 07/14/2016  . 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 Nausea   . Anxiety and depression   . Abdominal pain 07/13/2016  . Primary localized osteoarthritis of left knee 06/24/2016  . Primary localized osteoarthritis of right knee 10/16/2015  . Depression 10/16/2014  . Arthritis 11/14/2013  . Essential hypertension 11/14/2013  . Preventive measure 11/14/2013  . Uterine mass 08/01/2013  . Deficiency anemia 07/09/2013  . Fatigue 07/09/2013  . Knee pain 07/09/2013  . Back pain 07/09/2013  . Abdominal pain, unspecified site 07/09/2013  .  Leukopenia 07/09/2013  . Flank pain 07/09/2013  . Pancytopenia (HCC) 12/14/2011    Past Surgical History:  Procedure Laterality Date  . COLONOSCOPY    . JOINT REPLACEMENT    . 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 ROTATOR CUFF REPAIR Right 09/2014  . TOTAL KNEE ARTHROPLASTY Right 10/16/2015   Procedure: TOTAL KNEE ARTHROPLASTY;  Surgeon: Frederico Hammananiel Caffrey, MD;  [facility];  Service: Orthopedics;  Laterality: Right;  . TOTAL KNEE ARTHROPLASTY Left 06/24/2016   Procedure: LEFT TOTAL KNEE ARTHROPLASTY;  Surgeon: Frederico Hammanaffrey, Daniel, MD;  [facility];  Service: Orthopedics;  Laterality: Left;    OB History    No data available       Home Medications    Prior to Admission medications   Medication Sig Start Date End Date Taking? Authorizing Provider  acetaminophen (TYLENOL) 500 MG tablet Take 1,000 mg by mouth 2 (two) times daily as needed (for pain.).    [provider]  ALPRAZolam Prudy Feeler(XANAX) 1 MG tablet Take 0.5-1 tablets (0.5-1 mg total) by mouth 2 (two) times daily as needed for anxiety. 0.5 mg daily and 1 mg at bedtime. 07/15/16   Rhetta MuraSamtani, Jai-Gurmukh, MD  apixaban (ELIQUIS) 2.5 MG TABS tablet Take 1 tablet (2.5 mg total) by mouth 2 (two) times daily. 06/24/16   Chadwell, Ivin BootyJoshua, PA-C  aspirin EC 81 MG tablet Take 81 mg by mouth daily.    [provider]  atenolol (TENORMIN) 25 MG tablet Take 50 mg by mouth every evening.  05/16/16   [provider]  Calcium Carbonate-Vit D-Min (CALCIUM 600+D PLUS MINERALS PO) Take 1 tablet by mouth 2 (two) times daily.    [provider]  clonazePAM (KLONOPIN) 0.5 MG tablet Take 1 tablet (0.5 mg total) by mouth 2 (two) times daily. Take if you are having a panic attack spell--note that this is similar to ativan--follo with psychiatrist 07/15/16   Rhetta Mura, MD  DULoxetine (CYMBALTA) 30 MG capsule Take 1 capsule (30 mg total) by mouth daily. 07/16/16   Rhetta Mura, MD  fenofibrate  (TRICOR) 145 MG tablet Take 145 mg by mouth every evening.  12/07/11   [provider]  ferrous sulfate 325 (65 FE) MG tablet Take 325 mg by mouth See admin instructions. Take 2-3 times weekly due to side effects    [provider]  fish oil-omega-3 fatty acids 1000 MG capsule Take 2 g by mouth 2 (two) times daily.     [provider]  gabapentin (NEURONTIN) 100 MG capsule Take 1 capsule (100 mg total) by mouth 2 (two) times daily. 07/15/16   Rhetta Mura, MD  lisinopril-hydrochlorothiazide (PRINZIDE,ZESTORETIC) 20-12.5 MG per tablet Take 1 tablet by mouth every evening.  04/28/14   [provider]  loratadine (CLARITIN) 10 MG tablet Take 10 mg by mouth 2 (two) times daily.     [provider]  mirtazapine (REMERON) 7.5 MG tablet Take 1 tablet (7.5 mg total) by mouth at bedtime. 07/15/16   Rhetta Mura, MD  Multiple Vitamin (MULTIVITAMIN WITH MINERALS) TABS tablet Take 1 tablet by mouth every evening. One-A-Day Women 50+    [provider]  omeprazole (PRILOSEC) 20 MG capsule Take 1 capsule (20 mg total) by mouth daily. 07/10/16 07/24/16  Shaune Pollack, MD  ondansetron (ZOFRAN) 4 MG tablet Take 1 tablet (4 mg total) by mouth every 8 (eight) hours as needed for nausea or vomiting. 06/24/16   Chadwell, Ivin Booty, PA-C  Probiotic Product (PROBIOTIC PO) Take 1 capsule by mouth every evening.    [provider]  simvastatin (ZOCOR) 20 MG tablet Take 20 mg by mouth every evening.  12/07/11   [provider]  sucralfate (CARAFATE) 1 GM/10ML suspension Take 10 mLs (1 g total) by mouth 4 (four) times daily -  with meals and at bedtime. 07/10/16   Shaune Pollack, MD    Family History Family History  Problem Relation Age of Onset  . Cancer Mother        breast ca  . Hypertension Father   . CVA Father     Social History Social History  Substance Use Topics  . Smoking status: Never Smoker  . Smokeless tobacco: Never Used    . Alcohol use No     Allergies   No known allergies   Review of Systems Review of Systems  Constitutional: Positive for appetite change. Negative for fever.  Respiratory: Negative for shortness of breath.   Cardiovascular: Negative for chest pain.  Gastrointestinal: Negative for abdominal distention.  Genitourinary: Negative for hematuria.  Musculoskeletal: Negative for back pain.  Neurological: Negative for seizures.  Hematological: Negative for adenopathy.  Psychiatric/Behavioral: Positive for dysphoric mood. Negative for suicidal ideas.     Physical Exam Updated Vital Signs BP (!) 156/69 (BP Location: Left Arm)   Pulse 62   Temp 98 F (36.7 C) (Oral)   Resp 16   SpO2 95%   Physical Exam  Constitutional: She appears well-developed.  Eyes: Pupils are equal, round, and reactive to light.  Neck: Neck supple.  Cardiovascular: Normal rate.   Pulmonary/Chest: Effort normal.  Abdominal: Soft. There is no tenderness.  Musculoskeletal: She exhibits no edema.  Neurological: She is alert.  Skin: Skin is warm. Capillary refill takes less than 2 seconds.  Psychiatric: She has a normal mood and affect.     ED Treatments / Results  Labs (all labs ordered are listed, but only abnormal results are displayed) Labs Reviewed  COMPREHENSIVE METABOLIC PANEL - Abnormal; Notable for the following:       Result Value   Chloride 100 (*)    Glucose, Bld 112 (*)    Alkaline Phosphatase 28 (*)    All other components within normal limits  RAPID URINE DRUG SCREEN, HOSP PERFORMED - Abnormal; Notable for the following:    Benzodiazepines POSITIVE (*)    All other components within normal limits  CBC WITH DIFFERENTIAL/PLATELET - Abnormal; Notable for the following:    WBC 3.5 (*)    Hemoglobin 11.9 (*)    Platelets 130 (*)    All other components within normal limits  URINALYSIS, ROUTINE W REFLEX MICROSCOPIC - Abnormal; Notable for the following:    Leukocytes, UA MODERATE (*)     Squamous Epithelial / LPF 0-5 (*)    All other components within normal limits  ETHANOL    EKG  EKG Interpretation  Date/Time:  Friday August 19 2016 17:57:39 EDT Ventricular Rate:  66 PR Interval:  230 QRS Duration: 138 QT Interval:  446 QTC Calculation: 467 R Axis:   -28 Text Interpretation:  Sinus rhythm with 1st degree A-V block Right bundle branch block Moderate voltage criteria for LVH, may be normal variant Abnormal ECG No significant change since last tracing Confirmed by Benjiman CorePickering, Jamiah Homeyer [phone]([id number]) on 08/19/2016 6:01:41 PM       Radiology Dg Chest 2 View  Result Date: 08/19/2016 CLINICAL DATA:  Depression.  Pancytopenia.  Essential hypertension. EXAM: CHEST  2 VIEW COMPARISON:  10/05/2015 FINDINGS: Heart size remains within normal limits. Ectasia and atherosclerotic calcification of thoracic aorta is stable. Both lungs are clear. No evidence of pleural effusion. IMPRESSION: No active cardiopulmonary disease. Electronically Signed   By: Myles RosenthalJohn  Stahl M.D.   On: 08/19/2016 17:39    Procedures Procedures (including critical care time)  Medications Ordered in ED Medications - No data to display   Initial Impression / Assessment and Plan / ED Course  I have reviewed the triage vital signs and the nursing notes.  Pertinent labs & imaging results that were available during my care of the patient were reviewed by me and considered in my medical decision making (see chart for details).     Patient with anxiety and depression. Medically cleared. Had previously been seen by behavioral health and inpatient treatment recommended. She is medically cleared at this time.  Final Clinical Impressions(s) / ED Diagnoses   Final diagnoses:  Depression, unspecified depression type    New Prescriptions New Prescriptions   No medications on file     Benjiman CorePickering, Milinda Sweeney, MD 08/19/16 [phone]

## 2016-08-19 NOTE — Tx Team (Signed)
Initial Treatment Plan 08/19/2016 10:46 PM Sandy OrisBetty R Wildeman [id number]    PATIENT STRESSORS: Loss of Husband and Sister   PATIENT STRENGTHS: Ability for insight Active sense of humor Average or above average intelligence Capable of independent living MetallurgistCommunication skills Financial means General fund of knowledge Motivation for treatment/growth Physical Health Religious Affiliation Special hobby/interest Supportive family/friends   PATIENT IDENTIFIED PROBLEMS: Depression  Anxiety  "Hoping to get on something that works (meds)"  "Geophysical data processorCope with anxiety and depression"               DISCHARGE CRITERIA:  Ability to meet basic life and health needs Verbal commitment to aftercare and medication compliance  PRELIMINARY DISCHARGE PLAN: Outpatient therapy Return to previous living arrangement  PATIENT/FAMILY INVOLVEMENT: This treatment plan has been presented to and reviewed with the patient, Sandy Hodges.  The patient and family have been given the opportunity to ask questions and make suggestions.  Carleene OverlieMiddleton, Nadiyah Zeis P, RN 08/19/2016, 10:46 PM

## 2016-08-19 NOTE — BH Assessment (Signed)
Tele Assessment Note   Sandy Hodges is an 71 y.o. female [facility] with a longtime friend from her church. The patient admits to daily panic attacks, inability to focus, decreased appetite, not cooking or cleaning the house. States over the last year she's experienced multiple losses and surgeries. Most recently her husband died December 2017, sister died May 2018 and she had knee surgery June 2018.  Since the surgery she reported increased depression and anxiety. The patient appeared to have depressed mood, was leaning against the wall throughout the interview. The patient, unprompted, admits to having a handgun at her bedside for protection and stated "If I can't get well. I'd rather be dead."  Then later states she didn't think she would harm herself, stated she had too much to live for.  Denied SI, HI and A/V.  The patient has lived alone since her husbands death 8 months ago. Has two sons and daughter-n-law's in the area, as well as, multiple grandchildren.  The patient expressed having many friends in the community and through her church. Has been taking Xanax for a while, started on Cymbalta recently and x 2 weeks ago started on Lexapro.  Denies any noticeable improvement. [facility]   Elta GuadeloupeLaurie Parks, NP recommends psychiatric inpatient   Diagnosis: MDD, recurrent severe, without psychosis; GAD  Past Medical History:  Past Medical History:  Diagnosis Date  . Abdominal pain, unspecified site 07/09/2013  . Anxiety   . Arthritis 11/14/2013  . Back pain 07/09/2013  . Blood dyscrasia    [facility]topenial; [facility] Bertis Ruddy[facility]orsuch  . Depression 10/16/2014  . Essential hypertension 11/14/2013  . Fatigue 07/09/2013  . Fibromyalgia   . Hyperlipidemia   . Hypertension   . Knee pain 07/09/2013  . OA (osteoarthritis) of knee   . Pancytopenia (HCC) 12/14/2011  . Pancytopenia (HCC)   . PONV (postoperative nausea and  vomiting)    nausea an vomiting  . Unspecified deficiency anemia 07/09/2013    Past Surgical History:  Procedure Laterality Date  . COLONOSCOPY    . JOINT REPLACEMENT    . 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 ROTATOR CUFF REPAIR Right 09/2014  . TOTAL KNEE ARTHROPLASTY Right 10/16/2015   Procedure: TOTAL KNEE ARTHROPLASTY;  Surgeon: Frederico Hammananiel Caffrey, MD;  [facility];  Service: Orthopedics;  Laterality: Right;  . TOTAL KNEE ARTHROPLASTY Left 06/24/2016   Procedure: LEFT TOTAL KNEE ARTHROPLASTY;  Surgeon: Frederico Hammanaffrey, Daniel, MD;  [facility];  Service: Orthopedics;  Laterality: Left;    Family History:  Family History  Problem Relation Age of Onset  . Cancer Mother        breast ca  . Hypertension Father   . CVA Father     Social History:  reports that she has never smoked. She has never used smokeless tobacco. She reports that she does not drink alcohol or use drugs.  Additional Social History:  Alcohol / Drug Use Pain Medications: see MAR Prescriptions: see MAR Over the Counter: see MAR History of alcohol / drug use?: No history of alcohol / drug abuse  CIWA: CIWA-Ar BP: 137/79 Pulse Rate: 72 COWS:    PATIENT STRENGTHS: (choose at least two) Average or above average intelligence General fund of knowledge  Allergies:  Allergies  Allergen Reactions  . No Known Allergies     Home Medications:  (Not in a hospital admission)  OB/GYN  Status:  No LMP recorded. Patient is postmenopausal.  General Assessment Data Location of Assessment: [facility] Assessment Services TTS Assessment: In system Is this a Tele or Face-to-Face Assessment?: Face-to-Face Is this an Initial Assessment or a Re-assessment for this encounter?: Initial Assessment Marital status: Widowed [place] name: n/a Is patient pregnant?: No Pregnancy Status: No Living Arrangements: Alone Can pt return to current living arrangement?: Yes Admission Status: Voluntary Is patient capable of  signing voluntary admission?: Yes Referral Source: Self/Family/Friend Insurance type: [facility]  Medical Screening Exam [facility] Walk-in ONLY) Medical Exam completed: Yes  Crisis Care Plan Living Arrangements: Alone Name of Psychiatrist: n/a Name of Therapist: n/a  Education Status Is patient currently in school?: No Highest grade of school patient has completed: 12th grade  Risk to self with the past 6 months Suicidal Ideation: Yes-Currently Present Has patient been a risk to self within the past 6 months prior to admission? : Yes Suicidal Intent: No Has patient had any suicidal intent within the past 6 months prior to admission? : No Is patient at risk for suicide?: Yes Suicidal Plan?: No (denies- see note) Has patient had any suicidal plan within the past 6 months prior to admission? : No Access to Means: Yes Specify Access to Suicidal Means: owns a handgun she keeps at her bedside What has been your use of drugs/alcohol within the last 12 months?: n/a Previous Attempts/Gestures: No How many times?: 0 Other Self Harm Risks: 0 Intentional Self Injurious Behavior: None Family Suicide History: No Recent stressful life event(s): Loss (Comment), Recent negative physical changes (multiple losses and surgeries over the last year) Persecutory voices/beliefs?: No Depression: Yes Depression Symptoms: Tearfulness, Isolating, Loss of interest in usual pleasures, Feeling worthless/self pity Substance abuse history and/or treatment for substance abuse?: No Suicide prevention information given to non-admitted patients: Not applicable  Risk to Others within the past 6 months Homicidal Ideation: No Does patient have any lifetime risk of violence toward others beyond the six months prior to admission? : No Thoughts of Harm to Others: No Current Homicidal Intent: No Current Homicidal Plan: No Access to Homicidal Means: No History of harm to others?: No Assessment of Violence: None Noted Does  patient have access to weapons?: Yes (Comment) (has a handgun) Criminal Charges Pending?: No Does patient have a court date: No Is patient on probation?: No  Psychosis Hallucinations: None noted Delusions: None noted  Mental Status Report Appearance/Hygiene: Unremarkable Eye Contact: Fair Motor Activity: Freedom of movement Speech: Logical/coherent Level of Consciousness: Alert Mood: Depressed, Anxious Affect: Depressed Anxiety Level: Panic Attacks Panic attack frequency: daily Most recent panic attack: today Thought Processes: Coherent, Relevant Judgement: Impaired Orientation: Person, Place, Time, Situation Obsessive Compulsive Thoughts/Behaviors: None  Cognitive Functioning Concentration: Normal Memory: Recent Intact, Remote Intact IQ: Average Insight: Fair Impulse Control: Fair Appetite: Poor Weight Loss: 0 Weight Gain: 0 Sleep: Decreased Vegetative Symptoms: None  ADLScreening [facility] Assessment Services) Patient's cognitive ability adequate to safely complete daily activities?: Yes Patient able to express need for assistance with ADLs?: Yes Independently performs ADLs?: Yes (appropriate for developmental age)  Prior Inpatient Therapy Prior Inpatient Therapy: No  Prior Outpatient Therapy Prior Outpatient Therapy: No Does patient have an ACCT team?: No Does patient have Intensive In-House Services?  : No Does patient have [facility] services? : No Does patient have P4CC services?: No  ADL Screening (condition at time of admission) Patient's cognitive ability adequate to safely complete daily activities?: Yes Is the patient deaf or have difficulty hearing?: No Does the patient  have difficulty seeing, even when wearing glasses/contacts?: No Does the patient have difficulty concentrating, remembering, or making decisions?: No Patient able to express need for assistance with ADLs?: Yes Does the patient have difficulty dressing or bathing?: No Independently  performs ADLs?: Yes (appropriate for developmental age)       Abuse/Neglect Assessment (Assessment to be complete while patient is alone) Physical Abuse: Denies Verbal Abuse: Denies Sexual Abuse: Denies     Merchant navy officerAdvance Directives (For Healthcare) Does Patient Have a Medical Advance Directive?: No    Additional Information 1:1 In Past 12 Months?: No CIRT Risk: No Elopement Risk: No Does patient have medical clearance?: No     Disposition:  Disposition Initial Assessment Completed for this Encounter: Yes Disposition of Patient: Inpatient treatment program Type of inpatient treatment program: Adult  Westley Hummershley H Wilver Tignor 08/19/2016 3:50 PM

## 2016-08-19 NOTE — BHH Counselor (Signed)
Per Jorge NyLyndsey, [facility], pt can come after 9pm . Attending physician: Dr. Jama Flavorsobos. Nursing report: [phone].    Redmond Pullingreylese D Paydon Carll, MS, [facility], [facility] Triage Specialist [phone]

## 2016-08-20 DIAGNOSIS — F332 Major depressive disorder, recurrent severe without psychotic features: Principal | ICD-10-CM

## 2016-08-20 MED ORDER — OMEGA-3 FATTY ACIDS 1000 MG PO CAPS
2.0000 g | ORAL_CAPSULE | Freq: Two times a day (BID) | ORAL | Status: DC
  Administered 2016-08-20 – 2016-08-25 (×10): 2 g via ORAL
  Filled 2016-08-20 (×12): qty 2

## 2016-08-20 MED ORDER — HYDROCHLOROTHIAZIDE 12.5 MG PO CAPS
12.5000 mg | ORAL_CAPSULE | Freq: Every day | ORAL | Status: DC
  Administered 2016-08-20 – 2016-08-24 (×5): 12.5 mg via ORAL
  Filled 2016-08-20 (×7): qty 1

## 2016-08-20 MED ORDER — SIMVASTATIN 20 MG PO TABS
20.0000 mg | ORAL_TABLET | Freq: Every evening | ORAL | Status: DC
  Administered 2016-08-20 – 2016-08-24 (×5): 20 mg via ORAL
  Filled 2016-08-20 (×7): qty 1

## 2016-08-20 MED ORDER — LORATADINE 10 MG PO TABS: 10.0000 mg | ORAL_TABLET | Freq: Two times a day (BID) | ORAL | Status: DC

## 2016-08-20 MED ORDER — CALCIUM CARBONATE-VITAMIN D 500-200 MG-UNIT PO TABS
1.0000 | ORAL_TABLET | Freq: Two times a day (BID) | ORAL | Status: DC
  Administered 2016-08-20 – 2016-08-25 (×10): 1 via ORAL
  Filled 2016-08-20 (×12): qty 1

## 2016-08-20 MED ORDER — LISINOPRIL-HYDROCHLOROTHIAZIDE 20-12.5 MG PO TABS: 1.0000 | ORAL_TABLET | Freq: Every evening | ORAL | Status: DC

## 2016-08-20 MED ORDER — SUCRALFATE 1 GM/10ML PO SUSP
1.0000 g | Freq: Three times a day (TID) | ORAL | Status: DC
  Administered 2016-08-20 – 2016-08-25 (×21): 1 g via ORAL
  Filled 2016-08-20 (×26): qty 10

## 2016-08-20 MED ORDER — ONDANSETRON HCL 4 MG PO TABS: 4.0000 mg | ORAL_TABLET | Freq: Three times a day (TID) | ORAL | Status: DC | PRN

## 2016-08-20 MED ORDER — LISINOPRIL 20 MG PO TABS
20.0000 mg | ORAL_TABLET | Freq: Every day | ORAL | Status: DC
  Administered 2016-08-20 – 2016-08-24 (×5): 20 mg via ORAL
  Filled 2016-08-20 (×7): qty 1

## 2016-08-20 MED ORDER — FERROUS SULFATE 325 (65 FE) MG PO TABS
325.0000 mg | ORAL_TABLET | Freq: Every day | ORAL | Status: DC
  Administered 2016-08-21 – 2016-08-25 (×3): 325 mg via ORAL
  Filled 2016-08-20 (×6): qty 1

## 2016-08-20 MED ORDER — ASPIRIN EC 81 MG PO TBEC
81.0000 mg | DELAYED_RELEASE_TABLET | Freq: Every day | ORAL | Status: DC
  Administered 2016-08-20 – 2016-08-25 (×6): 81 mg via ORAL
  Filled 2016-08-20 (×8): qty 1

## 2016-08-20 MED ORDER — CALCIUM 600+D PLUS MINERALS 600-400 MG-UNIT PO TABS: ORAL_TABLET | Freq: Two times a day (BID) | ORAL | Status: DC

## 2016-08-20 MED ORDER — ENSURE ENLIVE PO LIQD
237.0000 mL | Freq: Two times a day (BID) | ORAL | Status: DC
  Administered 2016-08-20 – 2016-08-23 (×5): 237 mL via ORAL

## 2016-08-20 MED ORDER — LORATADINE 10 MG PO TABS
10.0000 mg | ORAL_TABLET | Freq: Every day | ORAL | Status: DC
Start: 2016-08-20 — End: 2016-08-25
  Administered 2016-08-20 – 2016-08-25 (×6): 10 mg via ORAL
  Filled 2016-08-20 (×8): qty 1

## 2016-08-20 MED ORDER — PANTOPRAZOLE SODIUM 40 MG PO TBEC
40.0000 mg | DELAYED_RELEASE_TABLET | Freq: Every day | ORAL | Status: DC
  Administered 2016-08-20 – 2016-08-25 (×6): 40 mg via ORAL
  Filled 2016-08-20 (×8): qty 1

## 2016-08-20 NOTE — B[facility] Suicide Risk Assessment (Signed)
[facility]   Nursing information obtained from:  Patient Demographic factors:  Age 71 or older, Divorced or widowed, Caucasian, Living alone, Unemployed Current Mental Status:  NA Loss Factors:  Loss of significant relationship, Decline in physical health Historical Factors:  NA Risk Reduction Factors:  Positive social support  Total Time spent with patient: 1 hour Principal Problem: <principal problem not specified> Diagnosis:   Patient Active Problem List   Diagnosis Date Noted  . MDD (major depressive disorder), recurrent severe, without psychosis (HCC) [F33.2] 07/14/2016  . 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 Nausea [R11.0]   . Anxiety and depression [F41.9, F32.9]   . Abdominal pain [R10.9] 07/13/2016  . Primary localized osteoarthritis of left knee [M17.12] 06/24/2016  . Primary localized osteoarthritis of right knee [M17.11] 10/16/2015  . Depression [F32.9] 10/16/2014  . Arthritis [M19.90] 11/14/2013  . Essential hypertension [I10] 11/14/2013  . Preventive measure [Z29.9] 11/14/2013  . Uterine mass [N85.9] 08/01/2013  . Deficiency anemia [D53.9] 07/09/2013  . Fatigue [R53.83] 07/09/2013  . Knee pain [M25.569] 07/09/2013  . Back pain [M54.9] 07/09/2013  . Abdominal pain, unspecified site [R10.9] 07/09/2013  . Leukopenia [D72.819] 07/09/2013  . Flank pain [R10.9] 07/09/2013  . Pancytopenia [facility](HCC) [Z61.096][D61.818] 12/14/2011   Subjective Data: Alert, oriented, depressed  Continued Clinical Symptoms:  Alcohol Use Disorder Identification Test Final Score (AUDIT): 0 The "Alcohol Use Disorders Identification Test", Guidelines for Use in Primary Care, Second Edition.  World Science writerHealth Organization [facility](WHO). Score between 0-7:  no or low risk or alcohol related problems. Score between 8-15:  moderate risk of alcohol related problems. Score between 16-19:  high risk of alcohol related problems. Score 20 or above:  warrants further diagnostic evaluation  for alcohol dependence and treatment.   CLINICAL FACTORS:   Depression:   Anhedonia Hopelessness Insomnia Unstable or Poor Therapeutic Relationship   Musculoskeletal: Strength & Muscle Tone: within normal limits Gait & Station: bending due to knee pain replacement surgery recent Patient leans: Front  Psychiatric Specialty Exam: Physical Exam  Review of Systems  Cardiovascular: Negative for chest pain.  Skin: Negative for rash.  Psychiatric/Behavioral: The patient is nervous/anxious.     Blood pressure 114/69, pulse 70, temperature 98.4 F (36.9 C), temperature source Oral, resp. rate 16, height 5\' 3"  (1.6 m), weight 64 kg (141 lb), SpO2 100 %.Body mass index is 24.98 kg/m.  General Appearance: Disheveled  Eye Contact:  Fair  Speech:  Normal Rate  Volume:  Decreased  Mood:  Depressed and Dysphoric  Affect:  Congruent and Constricted  Thought Process:  Goal Directed  Orientation:  Full (Time, Place, and Person)  Thought Content:  Rumination  Suicidal Thoughts:  No  Homicidal Thoughts:  No  Memory:  Immediate;   Fair Recent;   Fair  Judgement:  Fair  Insight:  Shallow  Psychomotor Activity:  Decreased  Concentration:  Concentration: Fair and Attention Span: Fair  Recall:  FiservFair  Fund of Knowledge:  Fair  Language:  Fair  Akathisia:  Negative  Handed:  Right  AIMS (if indicated):     Assets:  Desire for Improvement  ADL's:  Intact  Cognition:  WNL  Sleep:         COGNITIVE FEATURES THAT CONTRIBUTE TO RISK:  Closed-mindedness    SUICIDE RISK:   Moderate:  Frequent suicidal ideation with limited intensity, and duration, some specificity in terms of plans, no associated intent, good self-control, limited dysphoria/symptomatology, some risk factors present, and identifiable protective factors, including  available and accessible social support.  PLAN OF CARE: Admit for stabilization, medication management, safety.   I certify that inpatient services furnished can  reasonably be expected to improve the patient's condition.   Thresa RossAKHTAR, Lynnita Somma, MD 08/20/2016, 10:19 AM

## 2016-08-20 NOTE — Progress Notes (Signed)
Pt rated her day a 4 out of 10. Pt wants to work on her depression.

## 2016-08-20 NOTE — Progress Notes (Signed)
Data. Patient denies SI/HI/AVH.   Patient interacting well with staff and other patients.  Action. Emotional support and encouragement offered. Education provided on medication, indications and side effect. Q 15 minute checks done for safety. Response. Safety on the unit maintained through 15 minute checks.  Medications taken as prescribed. Attended groups. Remained calm and appropriate through out shift. 

## 2016-08-20 NOTE — [facility] Group Notes (Signed)
Adult Therapy Group Note (Clinical Social Work)  Date:  08/20/2016  Time:  10:00-11:00AM  Group Topic/Focus:  HEALTHY COPING SKILLS  Today's group focused on identifying unhealthy and healthy coping skills already in use by each patient.   There was much sharing, support, psychoeducation, and encouragement provided between patients.  A common theme of anxiety and depression, with some grief issues, was expressed among patients.  Ideas about healthy coping skills to learn were generated.   A mindfulness exercise was performed as an example of a healthy coping skill.  Participation Level:  Active  Participation Quality:  Attentive, Sharing and Supportive  Affect:  Anxious, Blunted, Depressed and Tearful  Cognitive:  Appropriate  Insight: Improving  Engagement in Group:  Engaged  Modes of Intervention:  Discussion, Support and Processing  Additional Comments:  The patient shared that the reason she is in the hospital is anxiety and depression, having lost not only her husband in December, but a sister, a brother-in-law and one other family member in the last year, along having with 3 surgeries.  Initially her family was not understanding and thought she should just get up and go about her life, but then they went with her to the doctor and were told about the illness of depression, now understanding better.  She uses a lot of isolation to protect herself from people trying to make her get out and do things she does not have the energy to do.  She stated the mindfulness exercise made her feel more peaceful.  Sandy JaegerMareida J Hodges 06/18/2016, 1:28 PM

## 2016-08-20 NOTE — H&P (Signed)
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 MDD (major depressive disorder), recurrent severe, without psychosis ([place]) [F33.2] 07/14/2016  . 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 Nausea [R11.0]   . Anxiety and depression [F41.9, F32.9]   . Abdominal pain [R10.9] 07/13/2016  . Primary localized osteoarthritis of left knee [M17.12] 06/24/2016  . Primary localized osteoarthritis of right knee [M17.11] 10/16/2015  . Depression [F32.9] 10/16/2014  . Arthritis [M19.90] 11/14/2013  . Essential hypertension [I10] 11/14/2013  . Preventive measure [Z29.9] 11/14/2013  . Uterine mass [N85.9] 08/01/2013  . Deficiency anemia [D53.9] 07/09/2013  . Fatigue [R53.83] 07/09/2013  . Knee pain [M25.569] 07/09/2013  . Back pain [M54.9] 07/09/2013  . Abdominal pain, unspecified site [R10.9] 07/09/2013  . Leukopenia [D72.819] 07/09/2013  . Flank pain [R10.9] 07/09/2013  . Pancytopenia [facility]) [D61.818] 12/14/2011   History of Present Illness: Sandy Hodges is an 71 y.o. female presented with the following recorded history during initial assessment  " Presented  to [facility] for an assessment with a longtime friend from her church. The patient admits to daily panic attacks, inability to focus, decreased appetite, not cooking or cleaning the house. States over the last year she's experienced multiple losses and surgeries. Most recently her husband died 2016-01-07, sister died 06/06/2016 and she had knee surgery June 2018.  Since the surgery she reported increased depression and anxiety. The patient appeared to have depressed mood, was leaning against the wall throughout the interview. The patient, unprompted, admits to having a handgun at her  bedside for protection and stated "If I can't get well. I'd rather be dead."  Then later states she didn't think she would harm herself, stated she had too much to live for.  Denied SI, HI and A/V"  On evaluation today remains depressed, down, sad but not suicidal. Somewhat better since in hospital. Endorsed above factors and lonliness, with multiple deaths in family contributing to depression. Delay recovery in knee replacement . Feels anxious, takes xanax but othermeds not for long.  Insight is improved Anxiety still worriful with poor sleep Modifying factors: some family support Aggravating factor: losses in family and husband death     Associated Signs/Symptoms: Depression Symptoms:  anhedonia, feelings of worthlessness/guilt, difficulty concentrating, hopelessness, anxiety, loss of energy/fatigue, disturbed sleep, (Hypo) Manic Symptoms:  Distractibility, Anxiety Symptoms:  Excessive Worry, Psychotic Symptoms:  denies PTSD Symptoms: Had a traumatic exposure:  deaths in family Hyperarousal:  Emotional Numbness/Detachment Sleep Total Time spent with patient: 1 hour  Past Psychiatric History: Depression, anxiety  Is the patient at risk to self? Yes.    Has the patient been a risk to self in the past 6 months? Yes.    Has the patient been a risk to self within the distant past? No.  Is the patient a risk to others? No.  Has the patient been a risk to others in the past 6 months? No.  Has the patient been a risk to others within the distant past? No.   Prior Inpatient Therapy: Prior Inpatient Therapy: No Prior Outpatient Therapy: Prior Outpatient Therapy: No Does patient have an ACCT team?: No Does patient have Intensive In-House Services?  : No Does patient have [facility] services? : No Does patient have P4CC services?: No  Alcohol Screening: 1.  How often do you have a drink containing alcohol?: Never 9. Have you or someone else been injured as a result of your drinking?:  No 10. Has a relative or friend or a doctor or another health worker been concerned about your drinking or suggested you cut down?: No Alcohol Use Disorder Identification Test Final Score (AUDIT): 0 Brief Intervention: AUDIT score less than 7 or less-screening does not suggest unhealthy drinking-brief intervention not indicated Substance Abuse History in the last 12 months:  No. Consequences of Substance Abuse: NA Previous Psychotropic Medications: Yes  Psychological Evaluations: No  Past Medical History:  Past Medical History:  Diagnosis Date  . Abdominal pain, unspecified site 07/09/2013  . Anxiety   . Arthritis 11/14/2013  . Back pain 07/09/2013  . Blood dyscrasia    Pancytopenial; followed at [facility] - Dr. Alvy Bimler  . Depression 10/16/2014  . Essential hypertension 11/14/2013  . Fatigue 07/09/2013  . Fibromyalgia   . Hyperlipidemia   . Hypertension   . Knee pain 07/09/2013  . OA (osteoarthritis) of knee   . Pancytopenia ([place]) 12/14/2011  . Pancytopenia ([place])   . PONV (postoperative nausea and vomiting)    nausea an vomiting  . Unspecified deficiency anemia 07/09/2013    Past Surgical History:  Procedure Laterality Date  . COLONOSCOPY    . JOINT REPLACEMENT    . 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 ROTATOR CUFF REPAIR Right 09/2014  . 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 TOTAL KNEE ARTHROPLASTY Left 06/24/2016   Procedure: LEFT TOTAL KNEE ARTHROPLASTY;  Surgeon: Earlie Server, MD;  Location: [place];  Service: Orthopedics;  Laterality: Left;   Family History:  Family History  Problem Relation Age of Onset  . Cancer Mother        breast ca  . Hypertension Father   . CVA Father    Family Psychiatric  History: see chart Tobacco Screening: Have you used any form of tobacco in the last 30 days? (Cigarettes, Smokeless Tobacco,  Cigars, and/or Pipes): No Social History:  History  Alcohol Use No     History  Drug Use No    Additional Social History: Marital status: Widowed    Pain Medications: see MAR Prescriptions: see MAR Over the Counter: see MAR History of alcohol / drug use?: No history of alcohol / drug abuse                    Allergies:   Allergies  Allergen Reactions  . No Known Allergies    Lab Results:  Results for orders placed or performed during the hospital encounter of 08/19/16 (from the past 48 hour(s))  Urine rapid drug screen (hosp performed)     Status: Abnormal   Collection Time: 08/19/16  5:03 PM  Result Value Ref Range   Opiates NONE DETECTED NONE DETECTED   Cocaine NONE DETECTED NONE DETECTED   Benzodiazepines POSITIVE (A) NONE DETECTED   Amphetamines NONE DETECTED NONE DETECTED   Tetrahydrocannabinol NONE DETECTED NONE DETECTED   Barbiturates NONE DETECTED NONE DETECTED    Comment:        DRUG SCREEN FOR MEDICAL PURPOSES ONLY.  IF CONFIRMATION IS NEEDED FOR ANY PURPOSE, NOTIFY LAB WITHIN 5 DAYS.        LOWEST DETECTABLE LIMITS FOR URINE DRUG SCREEN Drug Class  Cutoff (ng/mL) Amphetamine      1000 Barbiturate      200 Benzodiazepine   275 Tricyclics       170 Opiates          300 Cocaine          300 THC              50   Urinalysis, Routine w reflex microscopic     Status: Abnormal   Collection Time: 08/19/16  5:03 PM  Result Value Ref Range   Color, Urine YELLOW YELLOW   APPearance CLEAR CLEAR   Specific Gravity, Urine 1.009 1.005 - 1.030   pH 6.0 5.0 - 8.0   Glucose, UA NEGATIVE NEGATIVE mg/dL   Hgb urine dipstick NEGATIVE NEGATIVE   Bilirubin Urine NEGATIVE NEGATIVE   Ketones, ur NEGATIVE NEGATIVE mg/dL   Protein, ur NEGATIVE NEGATIVE mg/dL   Nitrite NEGATIVE NEGATIVE   Leukocytes, UA MODERATE (A) NEGATIVE   RBC / HPF 0-5 0 - 5 RBC/hpf   WBC, UA 0-5 0 - 5 WBC/hpf   Bacteria, UA NONE SEEN NONE SEEN   Squamous Epithelial / LPF 0-5 (A)  NONE SEEN   Mucous PRESENT   Comprehensive metabolic panel     Status: Abnormal   Collection Time: 08/19/16  5:16 PM  Result Value Ref Range   Sodium 135 135 - 145 mmol/L   Potassium 3.7 3.5 - 5.1 mmol/L   Chloride 100 (L) 101 - 111 mmol/L   CO2 26 22 - 32 mmol/L   Glucose, Bld 112 (H) 65 - 99 mg/dL   BUN 16 6 - 20 mg/dL   Creatinine, Ser 0.87 0.44 - 1.00 mg/dL   Calcium 10.3 8.9 - 10.3 mg/dL   Total Protein 7.5 6.5 - 8.1 g/dL   Albumin 4.3 3.5 - 5.0 g/dL   AST 21 15 - 41 U/L   ALT 14 14 - 54 U/L   Alkaline Phosphatase 28 (L) 38 - 126 U/L   Total Bilirubin 0.5 0.3 - 1.2 mg/dL   GFR calc non Af Amer >60 >60 mL/min   GFR calc Af Amer >60 >60 mL/min    Comment: (NOTE) The eGFR has been calculated using the CKD EPI equation. This calculation has not been validated in all clinical situations. eGFR's persistently <60 mL/min signify possible Chronic Kidney Disease.    Anion gap 9 5 - 15  Ethanol     Status: None   Collection Time: 08/19/16  5:16 PM  Result Value Ref Range   Alcohol, Ethyl (B) <5 <5 mg/dL    Comment:        LOWEST DETECTABLE LIMIT FOR SERUM ALCOHOL IS 5 mg/dL FOR MEDICAL PURPOSES ONLY   CBC with Diff     Status: Abnormal   Collection Time: 08/19/16  5:16 PM  Result Value Ref Range   WBC 3.5 (L) 4.0 - 10.5 K/uL   RBC 3.95 3.87 - 5.11 MIL/uL   Hemoglobin 11.9 (L) 12.0 - 15.0 g/dL   HCT 36.0 36.0 - 46.0 %   MCV 91.1 78.0 - 100.0 fL   MCH 30.1 26.0 - 34.0 pg   MCHC 33.1 30.0 - 36.0 g/dL   RDW 12.3 11.5 - 15.5 %   Platelets 130 (L) 150 - 400 K/uL   Neutrophils Relative % 67 %   Neutro Abs 2.3 1.7 - 7.7 K/uL   Lymphocytes Relative 25 %   Lymphs Abs 0.9 0.7 - 4.0 K/uL   Monocytes Relative 6 %  Monocytes Absolute 0.2 0.1 - 1.0 K/uL   Eosinophils Relative 1 %   Eosinophils Absolute 0.1 0.0 - 0.7 K/uL   Basophils Relative 1 %   Basophils Absolute 0.0 0.0 - 0.1 K/uL    Blood Alcohol level:  Lab Results  Component Value Date   ETH <5 16/10/9602     Metabolic Disorder Labs:  No results found for: HGBA1C, MPG No results found for: PROLACTIN No results found for: CHOL, TRIG, HDL, CHOLHDL, VLDL, LDLCALC  Current Medications: Current Facility-Administered Medications  Medication Dose Route Frequency Provider Last Rate Last Dose  . acetaminophen (TYLENOL) tablet 650 mg  650 mg Oral Q6H PRN Ethelene Hal, NP   650 mg at 08/19/16 2240  . ALPRAZolam Duanne Moron) tablet 0.5-1 mg  0.5-1 mg Oral BID PRN Ethelene Hal, NP   1 mg at 08/20/16 0810  . alum & mag hydroxide-simeth (MAALOX/MYLANTA) 200-200-20 MG/5ML suspension 30 mL  30 mL Oral Q4H PRN Ethelene Hal, NP      . atenolol (TENORMIN) tablet 50 mg  50 mg Oral QPM Ethelene Hal, NP   50 mg at 08/19/16 2240  . DULoxetine (CYMBALTA) DR capsule 30 mg  30 mg Oral Daily Ethelene Hal, NP   30 mg at 08/20/16 5409  . feeding supplement (ENSURE ENLIVE) (ENSURE ENLIVE) liquid 237 mL  237 mL Oral BID BM Lindon Romp A, NP      . hydrOXYzine (ATARAX/VISTARIL) tablet 25 mg  25 mg Oral TID PRN Ethelene Hal, NP      . magnesium hydroxide (MILK OF MAGNESIA) suspension 30 mL  30 mL Oral Daily PRN Ethelene Hal, NP      . traZODone (DESYREL) tablet 50 mg  50 mg Oral QHS PRN Ethelene Hal, NP   50 mg at 08/19/16 2241   PTA Medications: Prescriptions Prior to Admission  Medication Sig Dispense Refill Last Dose  . acetaminophen (TYLENOL) 500 MG tablet Take 1,000 mg by mouth 2 (two) times daily as needed (for pain.).   08/18/2016 at Unknown time  . ALPRAZolam (XANAX) 1 MG tablet Take 0.5-1 tablets (0.5-1 mg total) by mouth 2 (two) times daily as needed for anxiety. 0.5 mg daily and 1 mg at bedtime. (Patient taking differently: Take 0.5-1 mg by mouth 3 (three) times daily. 0.5 mg BID and 1 mg at bedtime.) 30 tablet 0 08/19/2016 at Unknown time  . aspirin EC 81 MG tablet Take 81 mg by mouth at bedtime.    08/18/2016 at Unknown time  . atenolol (TENORMIN) 50  MG tablet Take 50 mg by mouth every evening.   0 08/18/2016 at 2100  . Calcium Carbonate-Vit D-Min (CALCIUM 600+D PLUS MINERALS PO) Take 1 tablet by mouth 2 (two) times daily.   08/18/2016 at Unknown time  . clonazePAM (KLONOPIN) 0.5 MG tablet Take 1 tablet (0.5 mg total) by mouth 2 (two) times daily. Take if you are having a panic attack spell--note that this is similar to ativan--follo with psychiatrist (Patient not taking: Reported on 08/19/2016) 30 tablet 0 Completed Course at Unknown time  . DULoxetine (CYMBALTA) 30 MG capsule Take 1 capsule (30 mg total) by mouth daily. (Patient taking differently: Take 30 mg by mouth 2 (two) times daily. ) 30 capsule 0 08/19/2016 at Unknown time  . escitalopram (LEXAPRO) 5 MG tablet Take 5 mg by mouth daily.  0 08/19/2016 at Unknown time  . fenofibrate (TRICOR) 145 MG tablet Take 145 mg by mouth every evening.  08/18/2016 at Unknown time  . ferrous sulfate 325 (65 FE) MG tablet Take 325 mg by mouth daily with breakfast. Take 2-3 times weekly due to side effects   Past Week at Unknown time  . fish oil-omega-3 fatty acids 1000 MG capsule Take 2 g by mouth 2 (two) times daily.    08/19/2016 at Unknown time  . lisinopril-hydrochlorothiazide (PRINZIDE,ZESTORETIC) 20-12.5 MG per tablet Take 1 tablet by mouth every evening.   0 08/18/2016 at Unknown time  . loratadine (CLARITIN) 10 MG tablet Take 10 mg by mouth 2 (two) times daily.    08/19/2016 at Unknown time  . Multiple Vitamin (MULTIVITAMIN WITH MINERALS) TABS tablet Take 1 tablet by mouth every morning. One-A-Day Women 50+   08/19/2016 at Unknown time  . omeprazole (PRILOSEC) 20 MG capsule Take 1 capsule (20 mg total) by mouth daily. 14 capsule 0 Past Week at Unknown time  . ondansetron (ZOFRAN) 4 MG tablet Take 1 tablet (4 mg total) by mouth every 8 (eight) hours as needed for nausea or vomiting. (Patient not taking: Reported on 08/19/2016) 30 tablet 0 Completed Course at Unknown time  . Probiotic Product (PROBIOTIC PO) Take 1  capsule by mouth every morning.    08/19/2016 at Unknown time  . simvastatin (ZOCOR) 20 MG tablet Take 20 mg by mouth every evening.    08/18/2016 at Unknown time  . sucralfate (CARAFATE) 1 GM/10ML suspension Take 10 mLs (1 g total) by mouth 4 (four) times daily -  with meals and at bedtime. 420 mL 0     Musculoskeletal: Strength & Muscle Tone: within normal limits Gait & Station: some bending due to knee pain Patient leans: front  Psychiatric Specialty Exam: Physical Exam  HENT:  Head: Normocephalic.  Skin: She is not diaphoretic.    Review of Systems  Cardiovascular: Negative for chest pain.  Musculoskeletal: Positive for joint pain.  Skin: Negative for rash.  Psychiatric/Behavioral: Positive for depression. Negative for substance abuse. The patient is nervous/anxious.     Blood pressure 114/69, pulse 70, temperature 98.4 F (36.9 C), temperature source Oral, resp. rate 16, height '5\' 3"'$  (1.6 m), weight 64 kg (141 lb), SpO2 100 %.Body mass index is 24.98 kg/m.  General Appearance: Casual  Eye Contact:  Fair  Speech:  Slow  Volume:  Normal  Mood:  Dysphoric  Affect:  Constricted and Depressed  Thought Process:  Goal Directed  Orientation:  Full (Time, Place, and Person)  Thought Content:  Rumination  Suicidal Thoughts:  No  Homicidal Thoughts:  No  Memory:  Immediate;   Fair Recent;   Fair  Judgement:  Intact  Insight:  Shallow  Psychomotor Activity:  Decreased  Concentration:  Concentration: Fair and Attention Span: Fair  Recall:  [employer] of Knowledge:  Fair  Language:  Fair  Akathisia:  Negative  Handed:  Right  AIMS (if indicated):     Assets:  Desire for Improvement  ADL's:  Intact  Cognition:  WNL  Sleep:       Treatment Plan Summary: Daily contact with patient to assess and evaluate symptoms and progress in treatment, Medication management and Plan as follows  Observation Level/Precautions:  15 minute checks  Laboratory:  as needed  Psychotherapy:  As  per unit  Medications:  See chart  Consultations:    Discharge Concerns:    Estimated LOS: 5 days  Other:     Physician Treatment Plan for Primary Diagnosis: <principal problem not specified> Long Term Goal(s): Improvement  in symptoms so as ready for discharge and as follows  Short Term Goals: Ability to identify changes in lifestyle to reduce recurrence of condition will improve, Ability to verbalize feelings will improve, Ability to demonstrate self-control will improve, Ability to maintain clinical measurements within normal limits will improve and Compliance with prescribed medications will improve  Physician Treatment Plan for Secondary Diagnosis: Active Problems:   MDD (major depressive disorder), recurrent severe, without psychosis ([place])  Long Term Goal(s): Improvement in symptoms so as ready for discharge and wrok on compliance and keeping her busy. follow with providers as recommended  Short Term Goals: Ability to verbalize feelings will improve, Ability to disclose and discuss suicidal ideas and Ability to demonstrate self-control will improve  I certify that inpatient services furnished can reasonably be expected to improve the patient's condition.    Merian Capron, MD 8/4/201810:11 AM

## 2016-08-20 NOTE — Progress Notes (Signed)
Nursing Progress Note [id number]  D) Patient presents with blunted affect and depressed mood. Patient is pleasant, calm and cooperative. Patient states "this is all overwhelming". Patient adjusting to the units without questions or concerns. Patient denies SI/HI/AVH but endorses left knee pain from a recent replacement surgery. Patient contracts for safety on the unit. Patient requests medication for sleep.  A) Emotional support given. 1:1 interaction and active listening provided. Patient medicated as prescribed. Medications and plan of care reviewed with patient. Patient verbalized understanding without further questions. Snacks and fluids provided. Opportunities for questions or concerns presented to patient. Patient encouraged to continue to work on treatment goals. Labs, vital signs and patient behavior monitored throughout shift. Patient safety maintained with q15 min safety checks. Low fall risk precautions in place and reviewed with patient; patient verbalized understanding.  R) Patient receptive to interaction with nurse. Patient remains safe on the unit at this time. Patient denies any adverse medication reactions at this time. Patient is resting in bed without complaints. Will continue to monitor.

## 2016-08-20 NOTE — BHH Counselor (Signed)
Adult Comprehensive Assessment  Patient ID: Sandy Hodges, female   DOB: 07/22/1945, 71 y.o.   MRN: [id number]  Information Source: Information source: Patient  Current Stressors:  Educational / Learning stressors: Denies stressors Employment / Job issues: Denies stressors Family Relationships: One of her children and his family did not understand her depression & anxiety, pressured her to "get well."  This stressed her, but this has now been turned around. Financial / Lack of resources (include bankruptcy): Denies stressors. Housing / Lack of housing: Denies stressors.  Sons are taking care of yard, which is quite large. Physical health (include injuries & life threatening diseases): Had knee replacement on right knee just before husband died, shoulder surgery a year earlier, and left knee replacement on left knee in June 2018.  Feels she is doing well. Social relationships: If somebody comes to visit, if they stay very long and talk about things that don't make her happy, it makes her very anxious.  Gets anxious going out in public, even going to church. Substance abuse: Denies stressors. Bereavement / Loss: Husband died in Dec 2017, Sister died in May 2018, Brother-in-law 09/2015, Sister-in-law in 01/2016.    Living/Environment/Situation:  Living Arrangements: Alone Living conditions (as described by patient or guardian): Good How long has patient lived in current situation?: Lived in the same house 32 years, by herself since Dec 2017/. What is atmosphere in current home: Comfortable  Family History:  Marital status: Widowed Widowed, when?: Dec 2017; Had previously been married and was deserted by her first husband when he found out she was pregnant. Are you sexually active?: No What is your sexual orientation?: Straight Does patient have children?: Yes How many children?: 2 How is patient's relationship with their children?: 50yo and 47yo sons, 7 grandchildren - great  family  Childhood History:  By whom was/is the patient raised?: Both parents Description of patient's relationship with caregiver when they were a child: Good relationship with both parents except they did not have a lot. Patient's description of current relationship with people who raised him/her: Both deceased How were you disciplined when you got in trouble as a child/adolescent?: Switch Does patient have siblings?: Yes Number of Siblings: 3 Description of patient's current relationship with siblings: 1 sister who died in May 2018, 1 sister who is still living, 1 brother younger still living  - wonderful relationships Did patient suffer any verbal/emotional/physical/sexual abuse as a child?: No Did patient suffer from severe childhood neglect?: No Has patient ever been sexually abused/assaulted/raped as an adolescent or adult?: No Was the patient ever a victim of a crime or a disaster?: No Witnessed domestic violence?: No Has patient been effected by domestic violence as an adult?: No  Education:  Highest grade of school patient has completed: 12th grade Currently a student?: No Learning disability?: No  Employment/Work Situation:   Employment situation: Retired Therapist, 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 Where was the patient employed at that time?: [employer] Has patient ever been in the [employer] and Companymilitary?: No Are There Guns or Other Weapons in Your Home?: Yes Types of Guns/Weapons: Standing up in closet - 4-5 guns and rifles, 3 pistols - all unloaded except 1 kept loaded for protection Are These ComptrollerWeapons Safely Secured?: No Who Could Verify You Are Able To Have These Secured:: Either of her sons, probably oldest son Jerrell BelfastMark Rauls  Financial Resources:   Financial resources: Income from spouse, Medicare [facility](MCR, AARP, husband's social security) Does patient have  a representative payee or guardian?: No  Alcohol/Substance Abuse:   What has been your use of  drugs/alcohol within the last 12 months?: Denies use If attempted suicide, did drugs/alcohol play a role in this?: No Alcohol/Substance Abuse Treatment Hx: Denies past history Has alcohol/substance abuse ever caused legal problems?: No  Social Support System:   Conservation officer, natureatient's Community Support System: Good Describe Community Support System: Sons, daughters-in-laws, grandchildren, church family, family, friends Type of faith/religion: Christianity How does patient's faith help to cope with current illness?: Helps a lot to believe, participate in church  Leisure/Recreation:   Leisure and Hobbies: Bead, mostly about her grandchildren, goes to watch them play sports.  Strengths/Needs:   What things does the patient do well?: Cooking,sewing, visiting the sick, keeping things neat and clean In what areas does patient struggle / problems for patient: Anxiety and depression, grief  Discharge Plan:   Does patient have access to transportation?: Yes Will patient be returning to same living situation after discharge?: Yes Currently receiving community mental health services: Yes (From Whom) [facility](Sharon Wolters, [facility] [place] ) If no, would patient like referral for services when discharged?: Yes (What county?) (Hospice has been recommended, but she is not sure she needs it, was getting accustomed to her loss until she had surgery.  [place]  Went to [facility] for awhile after her husband's death, does not want to return.  Would prefer Hospi) Does patient have financial barriers related to discharge medications?: No  Summary/Recommendations:   Summary and Recommendations (to be completed by the evaluator): Patient is a 71yo female admitted with suicidal ideation and report that she keeps a handgun at her bedside for protection, stating during assessment "If I can't get well, I'd rather be dead."  She reports daily panic attacks, inability to focus, decreased appetite, not  performing normal cooking/cleaning activities.  Primary stressors include 4 recent family losses including husband in December 2017 and sister in May 2018, along with 2 knee replacement surgeries and shoulder surgeries recently.  She has been through counseling at [facility] and sees a doctor at [facility], has been prescribed Xanax for awhile, was started on Cymbalta recently and Lexapro about 2 weeks ago.  Patient will benefit from crisis stabilization, medication evaluation, group therapy and psychoeducation, in addition to case management for discharge planning. At discharge it is recommended that Patient adhere to the established discharge plan and continue in treatment.  Lynnell ChadMareida J Grossman-Orr. 08/20/2016

## 2016-08-20 NOTE — Plan of Care (Signed)
Problem: Safety: Goal: Periods of time without injury will increase Outcome: Progressing Patient is on q15 minute safety checks and low fall risk precautions. Patient A&0 x4, independently ambulates and contracts for safety on the unit. Patient able to make needs known.

## 2016-08-20 NOTE — BHH Group Notes (Signed)
Identifying Needs   Date:  08/20/2016  Time:  1300  Type of Therapy:  Nurse Education  /  The group is focused on teaching patients how to identify their needs and then how to develop the skills needed to get them met.   Participation Level:  Active  Participation Quality:  Attentive and Drowsy  Affect:  Appropriate  Cognitive:  Alert  Insight:  Appropriate  Engagement in Group:  Engaged  Modes of Intervention:  Education  Summary of Progress/Problems:  Lauralyn Primes 08/20/2016, 2:35 PM

## 2016-08-21 MED ORDER — SERTRALINE HCL 50 MG PO TABS
50.0000 mg | ORAL_TABLET | Freq: Every day | ORAL | Status: DC
  Administered 2016-08-21 – 2016-08-25 (×5): 50 mg via ORAL
  Filled 2016-08-21 (×7): qty 1

## 2016-08-21 NOTE — Progress Notes (Addendum)
Patient attended group and said that her day was a 8.  Patient said she was excited that her family stopped by to visit her.

## 2016-08-21 NOTE — Progress Notes (Signed)
[facility] MD Progress Note  08/21/2016 10:19 AM Sandy Hodges  MRN:  [id number] Subjective:  Alert , somewhat better depression wise as per report. Rested and slept HPI:    As per initial report " Sandy Hodges an 71 y.o.femalepresented with the following recorded history during initial assessment  " Presented  to [facility] for an assessment with a longtime friend from her church. The patient admits to daily panic attacks, inability to focus, decreased appetite, not cooking or cleaning the house. States over the last year she's experienced multiple losses and surgeries. Most recently her husband died December 29, 2015, sister died 05-28-16 and she had knee surgery June 2018. Since the surgery she reported increased depression and anxiety. The patient appeared to have depressed mood, was leaning against the wall throughout the interview. The patient, unprompted, admits to having a handgun at her bedside for protection and stated "If I can't get well. I'd rather be dead." Then later states she didn't think she would harm herself:   On evaluation today somewhat better, was overwhelemed prior to admission with knee surgery and feeling low and depressed. Not suicidal but wants to change her meds  From cymbalta to something different  Slept ok.  Mood not hopeless No psychosis.  Still remains subdued   Principal Problem: <principal problem not specified> Diagnosis:   Patient Active Problem List   Diagnosis Date Noted  . MDD (major depressive disorder), recurrent severe, without psychosis ([place]) [F33.2] 07/14/2016  . 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 Nausea [R11.0]   . Anxiety and depression [F41.9, F32.9]   . Abdominal pain [R10.9] 07/13/2016  . Primary localized osteoarthritis of left knee [M17.12] 06/24/2016  . Primary localized osteoarthritis of right knee [M17.11] 10/16/2015  . Depression [F32.9] 10/16/2014  . Arthritis [M19.90] 11/14/2013  . Essential hypertension [I10] 11/14/2013   . Preventive measure [Z29.9] 11/14/2013  . Uterine mass [N85.9] 08/01/2013  . Deficiency anemia [D53.9] 07/09/2013  . Fatigue [R53.83] 07/09/2013  . Knee pain [M25.569] 07/09/2013  . Back pain [M54.9] 07/09/2013  . Abdominal pain, unspecified site [R10.9] 07/09/2013  . Leukopenia [D72.819] 07/09/2013  . Flank pain [R10.9] 07/09/2013  . Pancytopenia [facility]) [D61.818] 12/14/2011   Total Time spent with patient: 30 minutes  Past Psychiatric History: depression   Past Medical History:  Past Medical History:  Diagnosis Date  . Abdominal pain, unspecified site 07/09/2013  . Anxiety   . Arthritis 11/14/2013  . Back pain 07/09/2013  . Blood dyscrasia    Pancytopenial; followed at [facility] - Dr. Alvy Bimler  . Depression 10/16/2014  . Essential hypertension 11/14/2013  . Fatigue 07/09/2013  . Fibromyalgia   . Hyperlipidemia   . Hypertension   . Knee pain 07/09/2013  . OA (osteoarthritis) of knee   . Pancytopenia ([place]) 12/14/2011  . Pancytopenia ([place])   . PONV (postoperative nausea and vomiting)    nausea an vomiting  . Unspecified deficiency anemia 07/09/2013    Past Surgical History:  Procedure Laterality Date  . COLONOSCOPY    . JOINT REPLACEMENT    . 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 ROTATOR CUFF REPAIR Right 09/2014  . TOTAL KNEE ARTHROPLASTY Right 10/16/2015   Procedure: TOTAL KNEE ARTHROPLASTY;  Surgeon: Earlie Server, MD;  Location: [place];  Service: Orthopedics;  Laterality: Right;  . TOTAL KNEE ARTHROPLASTY Left 06/24/2016   Procedure: LEFT TOTAL KNEE ARTHROPLASTY;  Surgeon: Earlie Server, MD;  Location: [place];  Service: Orthopedics;  Laterality: Left;  Family History:  Family History  Problem Relation Age of Onset  . Cancer Mother        breast ca  . Hypertension Father   . CVA Father     Social History:  History  Alcohol Use No     History  Drug Use No    Social History   Social History  . Marital status: Widowed    Spouse name: N/A  .  Number of children: N/A  . Years of education: N/A   Social History Main Topics  . Smoking status: Never Smoker  . Smokeless tobacco: Never Used  . Alcohol use No  . Drug use: No  . Sexual activity: Not Asked   Other Topics Concern  . None   Social History Narrative  . None   Additional Social History:    Pain Medications: see MAR Prescriptions: see MAR Over the Counter: see MAR History of alcohol / drug use?: No history of alcohol / drug abuse                    Sleep: Fair  Appetite:  Fair  Current Medications: Current Facility-Administered Medications  Medication Dose Route Frequency Provider Last Rate Last Dose  . acetaminophen (TYLENOL) tablet 650 mg  650 mg Oral Q6H PRN Ethelene Hal, NP   650 mg at 08/19/16 2240  . ALPRAZolam Duanne Moron) tablet 0.5-1 mg  0.5-1 mg Oral BID PRN Ethelene Hal, NP   1 mg at 08/20/16 2118  . alum & mag hydroxide-simeth (MAALOX/MYLANTA) 200-200-20 MG/5ML suspension 30 mL  30 mL Oral Q4H PRN Ethelene Hal, NP   30 mL at 08/20/16 1812  . aspirin EC tablet 81 mg  81 mg Oral Daily Merian Capron, MD   81 mg at 08/21/16 0801  . atenolol (TENORMIN) tablet 50 mg  50 mg Oral QPM Ethelene Hal, NP   50 mg at 08/20/16 1823  . calcium-vitamin D (OSCAL WITH D) 500-200 MG-UNIT per tablet 1 tablet  1 tablet Oral BID Cobos, Myer Peer, MD   1 tablet at 08/21/16 0802  . feeding supplement (ENSURE ENLIVE) (ENSURE ENLIVE) liquid 237 mL  237 mL Oral BID BM Lindon Romp A, NP   237 mL at 08/20/16 1616  . ferrous sulfate tablet 325 mg  325 mg Oral Q breakfast Merian Capron, MD   325 mg at 08/21/16 0801  . fish oil-omega-3 fatty acids capsule 2 g  2 g Oral BID Merian Capron, MD   2 g at 08/21/16 0803  . lisinopril (PRINIVIL,ZESTRIL) tablet 20 mg  20 mg Oral q1800 Cobos, Fernando A, MD   20 mg at 08/20/16 1823   And  . hydrochlorothiazide (MICROZIDE) capsule 12.5 mg  12.5 mg Oral q1800 Cobos, Myer Peer, MD   12.5 mg at  08/20/16 1823  . hydrOXYzine (ATARAX/VISTARIL) tablet 25 mg  25 mg Oral TID PRN Ethelene Hal, NP   25 mg at 08/20/16 1812  . loratadine (CLARITIN) tablet 10 mg  10 mg Oral Daily Merian Capron, MD   10 mg at 08/21/16 0803  . magnesium hydroxide (MILK OF MAGNESIA) suspension 30 mL  30 mL Oral Daily PRN Ethelene Hal, NP      . ondansetron [facility]) tablet 4 mg  4 mg Oral Q8H PRN Merian Capron, MD      . pantoprazole (PROTONIX) EC tablet 40 mg  40 mg Oral Daily Merian Capron, MD   40 mg at 08/21/16 0802  .  sertraline (ZOLOFT) tablet 50 mg  50 mg Oral Daily Merian Capron, MD      . simvastatin (ZOCOR) tablet 20 mg  20 mg Oral QPM Merian Capron, MD   20 mg at 08/20/16 1823  . sucralfate (CARAFATE) 1 GM/10ML suspension 1 g  1 g Oral TID WC & HS Merian Capron, MD   1 g at 08/21/16 0803  . traZODone (DESYREL) tablet 50 mg  50 mg Oral QHS PRN Ethelene Hal, NP   50 mg at 08/20/16 2119    Lab Results:  Results for orders placed or performed during the hospital encounter of 08/19/16 (from the past 48 hour(s))  Urine rapid drug screen (hosp performed)     Status: Abnormal   Collection Time: 08/19/16  5:03 PM  Result Value Ref Range   Opiates NONE DETECTED NONE DETECTED   Cocaine NONE DETECTED NONE DETECTED   Benzodiazepines POSITIVE (A) NONE DETECTED   Amphetamines NONE DETECTED NONE DETECTED   Tetrahydrocannabinol NONE DETECTED NONE DETECTED   Barbiturates NONE DETECTED NONE DETECTED    Comment:        DRUG SCREEN FOR MEDICAL PURPOSES ONLY.  IF CONFIRMATION IS NEEDED FOR ANY PURPOSE, NOTIFY LAB WITHIN 5 DAYS.        LOWEST DETECTABLE LIMITS FOR URINE DRUG SCREEN Drug Class       Cutoff (ng/mL) Amphetamine      1000 Barbiturate      200 Benzodiazepine   283 Tricyclics       151 Opiates          300 Cocaine          300 THC              50   Urinalysis, Routine w reflex microscopic     Status: Abnormal   Collection Time: 08/19/16  5:03 PM  Result Value Ref  Range   Color, Urine YELLOW YELLOW   APPearance CLEAR CLEAR   Specific Gravity, Urine 1.009 1.005 - 1.030   pH 6.0 5.0 - 8.0   Glucose, UA NEGATIVE NEGATIVE mg/dL   Hgb urine dipstick NEGATIVE NEGATIVE   Bilirubin Urine NEGATIVE NEGATIVE   Ketones, ur NEGATIVE NEGATIVE mg/dL   Protein, ur NEGATIVE NEGATIVE mg/dL   Nitrite NEGATIVE NEGATIVE   Leukocytes, UA MODERATE (A) NEGATIVE   RBC / HPF 0-5 0 - 5 RBC/hpf   WBC, UA 0-5 0 - 5 WBC/hpf   Bacteria, UA NONE SEEN NONE SEEN   Squamous Epithelial / LPF 0-5 (A) NONE SEEN   Mucous PRESENT   Comprehensive metabolic panel     Status: Abnormal   Collection Time: 08/19/16  5:16 PM  Result Value Ref Range   Sodium 135 135 - 145 mmol/L   Potassium 3.7 3.5 - 5.1 mmol/L   Chloride 100 (L) 101 - 111 mmol/L   CO2 26 22 - 32 mmol/L   Glucose, Bld 112 (H) 65 - 99 mg/dL   BUN 16 6 - 20 mg/dL   Creatinine, Ser 0.87 0.44 - 1.00 mg/dL   Calcium 10.3 8.9 - 10.3 mg/dL   Total Protein 7.5 6.5 - 8.1 g/dL   Albumin 4.3 3.5 - 5.0 g/dL   AST 21 15 - 41 U/L   ALT 14 14 - 54 U/L   Alkaline Phosphatase 28 (L) 38 - 126 U/L   Total Bilirubin 0.5 0.3 - 1.2 mg/dL   GFR calc non Af Amer >60 >60 mL/min   GFR calc Af Amer >60 >  60 mL/min    Comment: (NOTE) The eGFR has been calculated using the CKD EPI equation. This calculation has not been validated in all clinical situations. eGFR's persistently <60 mL/min signify possible Chronic Kidney Disease.    Anion gap 9 5 - 15  Ethanol     Status: None   Collection Time: 08/19/16  5:16 PM  Result Value Ref Range   Alcohol, Ethyl (B) <5 <5 mg/dL    Comment:        LOWEST DETECTABLE LIMIT FOR SERUM ALCOHOL IS 5 mg/dL FOR MEDICAL PURPOSES ONLY   CBC with Diff     Status: Abnormal   Collection Time: 08/19/16  5:16 PM  Result Value Ref Range   WBC 3.5 (L) 4.0 - 10.5 K/uL   RBC 3.95 3.87 - 5.11 MIL/uL   Hemoglobin 11.9 (L) 12.0 - 15.0 g/dL   HCT 36.0 36.0 - 46.0 %   MCV 91.1 78.0 - 100.0 fL   MCH 30.1 26.0 -  34.0 pg   MCHC 33.1 30.0 - 36.0 g/dL   RDW 12.3 11.5 - 15.5 %   Platelets 130 (L) 150 - 400 K/uL   Neutrophils Relative % 67 %   Neutro Abs 2.3 1.7 - 7.7 K/uL   Lymphocytes Relative 25 %   Lymphs Abs 0.9 0.7 - 4.0 K/uL   Monocytes Relative 6 %   Monocytes Absolute 0.2 0.1 - 1.0 K/uL   Eosinophils Relative 1 %   Eosinophils Absolute 0.1 0.0 - 0.7 K/uL   Basophils Relative 1 %   Basophils Absolute 0.0 0.0 - 0.1 K/uL    Blood Alcohol level:  Lab Results  Component Value Date   ETH <5 01/25/3233    Metabolic Disorder Labs: No results found for: HGBA1C, MPG No results found for: PROLACTIN No results found for: CHOL, TRIG, HDL, CHOLHDL, VLDL, LDLCALC  Physical Findings: AIMS: Facial and Oral Movements Muscles of Facial Expression: None, normal Lips and Perioral Area: None, normal Jaw: None, normal Tongue: None, normal,Extremity Movements Upper (arms, wrists, hands, fingers): None, normal Lower (legs, knees, ankles, toes): None, normal, Trunk Movements Neck, shoulders, hips: None, normal, Overall Severity Severity of abnormal movements (highest score from questions above): None, normal Incapacitation due to abnormal movements: None, normal Patient's awareness of abnormal movements (rate only patient's report): No Awareness, Dental Status Current problems with teeth and/or dentures?: No Does patient usually wear dentures?: No  CIWA:    COWS:     Musculoskeletal: Strength & Muscle Tone: within normal limits Gait & Station: normal Patient leans: no lean  Psychiatric Specialty Exam: Physical Exam  Review of Systems  Cardiovascular: Negative for chest pain.  Skin: Negative for rash.  Psychiatric/Behavioral: Positive for depression.    Blood pressure 99/67, pulse 73, temperature 98.6 F (37 C), temperature source Oral, resp. rate 18, height $RemoveBe'5\' 3"'LUnExRzGN$  (1.6 m), weight 64 kg (141 lb), SpO2 99 %.Body mass index is 24.98 kg/m.  General Appearance: Casual  Eye Contact:  Fair   Speech:  Slow  Volume:  Decreased  Mood:  Dysphoric  Affect:  Constricted  Thought Process:  Goal Directed  Orientation:  Full (Time, Place, and Person)  Thought Content:  Rumination  Suicidal Thoughts:  No  Homicidal Thoughts:  No  Memory:  Immediate;   Fair Recent;   Fair  Judgement:  Fair  Insight:  Shallow  Psychomotor Activity:  Decreased  Concentration:  Concentration: Fair and Attention Span: Fair  Recall:  [employer] of Knowledge:  Poor  Language:  Fair  Akathisia:  Negative  Handed:  Right  AIMS (if indicated):     Assets:  Desire for Improvement  ADL's:  Intact  Cognition:  WNL  Sleep:  Number of Hours: 6.5     Treatment Plan Summary: Daily contact with patient to assess and evaluate symptoms and progress in treatment, Medication management and Plan as follows  1. Major depressive disorder, recurrent: will stop cymbalta. Start zoloft '50mg'$ . Increase in one to two days Provided supportive therapy. Attend groups Work on Radiographer, therapeutic and self esteem Reviewed concerns    Merian Capron, MD 08/21/2016, 10:19 AM

## 2016-08-21 NOTE — Plan of Care (Signed)
Problem: Education: Goal: Knowledge of [place] [facility] Education information/materials will improve Outcome: Progressing Patient verbalizes understanding of unit rules and expectations.

## 2016-08-21 NOTE — Progress Notes (Signed)
Nursing Progress Note 1900-0730  D) Patient presents anxious but pleasant. Patient reports she is "overwhelmed" and asks staff frequently "what do I do now?" Patient attended group this evening and observed socializing appropriately with roommate. Patient denies SI/HI/AVH or pain. Patient contracts for safety on the unit. Patient reports poor sleep and requests medicine.  A) Emotional support given. 1:1 interaction and active listening provided. Patient medicated as prescribed. Medications and plan of care reviewed with patient. Patient verbalized understanding without further questions. Snacks and fluids provided. Opportunities for questions or concerns presented to patient. Patient encouraged to continue to work on treatment goals. Labs, vital signs and patient behavior monitored throughout shift. Patient safety maintained with q15 min safety checks. Moderate fall risk precautions in place and reviewed with patient; patient verbalized understanding.  R) Patient receptive to interaction with nurse. Patient remains safe on the unit at this time. Patient denies any adverse medication reactions at this time. Patient is resting in bed without complaints. Will continue to monitor.

## 2016-08-21 NOTE — Plan of Care (Signed)
Problem: Coping: Goal: Ability to verbalize feelings will improve Outcome: Progressing Pt was able to verbalize feeling of frustration to writer this morning in regards to not starting Zoloft as discussed with MD.

## 2016-08-21 NOTE — BHH Group Notes (Signed)
Healthy SUpport Systems  Date:  08/21/2016  Time:  1300  Type of Therapy:  Nurse Education  /  Healthy Support Systems : The group focuses on teaching patients how to develop and utilize healthy support systms to aide in their recovery  Participation Level:  Active  Participation Quality:  Attentive  Affect:  Appropriate  Cognitive:  Alert  Insight:  Improving  Engagement in Group:  Engaged  Modes of Intervention:  Education  Summary of Progress/Problems:  Rich BraveDuke, Deiona Hooper Lynn 08/21/2016, 3:43 PM

## 2016-08-21 NOTE — Plan of Care (Signed)
Problem: Coping: Goal: Ability to demonstrate self-control will improve Outcome: Progressing Pt managed to use coping skills to decrease anxiety level. Pt stated that she usually takes Xanax in the morning but was able to maintained without needing Xanax until late this afternoon.

## 2016-08-21 NOTE — [facility] Group Notes (Signed)
[facility]  Date/Time:  08/21/2016 10:00-11:00AM  Type of Therapy and Topic:  Group Therapy:  Healthy and Unhealthy Supports  Participation Level:  Active   Description of Group:  Patients in this group were introduced to the idea of adding a variety of healthy supports to address the various needs in their lives. The picture on the front of Sunday's workbook was used to demonstrate why more supports are needed in every patient's life.  Patients identified and described healthy supports versus unhealthy supports in general, then gave examples of each in their own lives.   They discussed what additional healthy supports could be helpful in their recovery and wellness after discharge in order to prevent future hospitalizations.   An emphasis was placed on using counselor, doctor, therapy groups, 12-step groups, and problem-specific support groups to expand supports.  They also worked as a group on developing a specific plan for several patients to deal with unhealthy supports through boundary-setting, psychoeducation with loved ones, and even termination of relationships.   Therapeutic Goals:   1)  discuss importance of adding supports to stay well once out of the hospital  2)  compare healthy versus unhealthy supports and identify some examples of each  3)  generate ideas and descriptions of healthy supports that can be added  4)  offer mutual support about how to address unhealthy supports  5)  encourage active participation in and adherence to discharge plan    Summary of Patient Progress:  The patient shared that the current healthy/unhealthy supports available in their life are her sons, daughters-in-law, grandchildren, church, friends and doctor.  She talked about how she got accustomed to life without her husband but when she had her surgeries suddenly she could not get out and do things, and then it got to the point she did not want to get out like she had been, then  it got to the point she would actually get anxious if people came in.  She looks forward to the addition of Hospice therapy.   Therapeutic Modalities:   Motivational Interviewing Brief Solution-Focused Therapy  Ambrose MantleMareida Grossman-Orr, LCSW 08/21/2016, 1:20 PM

## 2016-08-21 NOTE — Progress Notes (Signed)
D: Pt presents anxious on approach. Pt appears insightful about treatment plan. Pt verbalized that she was told that she would be starting Zoloft for depression but haven't started it yet. Pt stated that she's been taking Cymbalta and Lexapro for depression and that it wasn't effective for treating her depression. Pt denies SI. Pt reports decreasing depression and anxiety today. Pt reports sleeping well last night.  A: Medications reviewed with pt. Medications administered as ordered per MD. Pt started on Zoloft per MD. Education about Zoloft provided to pt. Verbal support provided. Pt encouraged to attend groups. 15 minute checks performed for safety.  R: Pt receptive to tx. Pt verbalizes understanding of med regimen.

## 2016-08-22 DIAGNOSIS — Z96653 Presence of artificial knee joint, bilateral: Secondary | ICD-10-CM

## 2016-08-22 DIAGNOSIS — F419 Anxiety disorder, unspecified: Secondary | ICD-10-CM

## 2016-08-22 DIAGNOSIS — Z634 Disappearance and death of family member: Secondary | ICD-10-CM

## 2016-08-22 MED ORDER — ALPRAZOLAM 0.5 MG PO TABS
0.5000 mg | ORAL_TABLET | Freq: Three times a day (TID) | ORAL | Status: DC
  Administered 2016-08-23 – 2016-08-25 (×8): 0.5 mg via ORAL
  Filled 2016-08-22 (×8): qty 1

## 2016-08-22 NOTE — [facility] Suicide Risk Assessment (Signed)
[facility] INPATIENT:  Family/Significant Other Suicide Prevention Education  Suicide Prevention Education:  Education Completed; Sandy Hodges, Sandy Hodges's son [phone], has been identified by the patient as the family member/significant other with whom the patient will be residing, and identified as the person(s) who will aid the patient in the event of a mental health crisis (suicidal ideations/suicide attempt).  With written consent from the patient, the family member/significant other has been provided the following suicide prevention education, prior to the and/or following the discharge of the patient.  The suicide prevention education provided includes the following:  Suicide risk factors  Suicide prevention and interventions  National Suicide Hotline telephone number  [facility] assessment telephone number  [facility] 911  [facility] and/or [facility] telephone number  Request made of family/significant other to:  Remove weapons (e.g., guns, rifles, knives), all items previously/currently identified as safety concern.    Remove drugs/medications (over-the-counter, prescriptions, illicit drugs), all items previously/currently identified as a safety concern.  The family member/significant other verbalizes understanding of the suicide prevention education information provided.  The family member/significant other agrees to remove the items of safety concern listed above.  Per Sandy Hodges's son, Sandy Hodges's anxiety and depression have been increasing over the last 7mo. Per son, there have been 8 deaths in the immediate family within 8 months, including her husband of 5766yrs, as well as a total knee replacement. Sandy Hodges's son reports that he is aware of Sandy Hodges's symptoms and is supportive of her. They are discussing whether patient should move to decrease isolation and feeling of loneliness.   Sandy LennertLauren C Dlynn Hodges 08/22/2016, 4:16 PM

## 2016-08-22 NOTE — Progress Notes (Signed)
D-pt had both of her sons and their wifes visit her tonight and she stated in group that them visiting really made her happy, pt stated that her high anxiety had decreased a lot tonight A-pt was active in group and took her medications as scheduled R-cont to monitor for safety

## 2016-08-22 NOTE — Plan of Care (Signed)
Problem: Activity: Goal: Sleeping patterns will improve Outcome: Not Progressing Pt slept 4 hours last night according to flowsheet.

## 2016-08-22 NOTE — Plan of Care (Signed)
Problem: Education: Goal: Ability to state activities that reduce stress will improve Outcome: Progressing Writer discussed coping mechanism with pt. Pt identifies reading and deep breathing techniques as coping mechanism.

## 2016-08-22 NOTE — [facility] Group Notes (Signed)
[facility]  08/22/2016 8:45 AM  Participation Quality: Alert, Appropriate and Oriented  Mood/Affect: Appropriate  Plan for Discharge/Comments: Pt attended discharge planning group and actively participated in group. CSW discussed suicide prevention education with the group and encouraged them to discuss discharge planning and any relevant barriers. Pt reports that she is feeling better today and is hopeful to discharge soon. Pt reports that she would like to talk to MD about medication adjustments.   Vernie ShanksLauren Keyshawna Prouse, LCSW 08/22/2016 11:25 AM

## 2016-08-22 NOTE — Progress Notes (Signed)
Recreation Therapy Notes  Date: 08/22/2016 Time: 9:30am Location: [address] Dayroom  Group Topic: Stress Management  Goal Area(s) Addresses:  Patient will verbalize importance of using healthy stress management.  Patient will identify positive emotions associated with healthy stress management.   Intervention: Stress Management  Activity :  Guided Body Scan. Recreation Therapy Intern introduced the stress management technique of guided body scans. Recreation Therapy Intern played a YouTube video that allowed patients to focus on the tension built up in each part of their body. Patients were to follow along as script was read to engage in the activity.  Education: Stress Management, Discharge Planning.   Education Outcome: Acknowledges edcuation  Clinical Observations/Feedback: Pt did not attend group.  Rachel Meyer, Recreation Therapy Intern    Timonthy Hovater, LRT/CTRS   

## 2016-08-22 NOTE — Tx Team (Signed)
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 acetaminophen (TYLENOL) tablet 650 mg  650 mg Oral Q6H PRN Laveda Abbe, NP   650 mg at 08/21/16 2114  . ALPRAZolam Prudy Feeler) tablet 0.5-1 mg  0.5-1 mg Oral BID PRN Laveda Abbe, NP   1 mg at 08/21/16 1539  . alum & mag hydroxide-simeth (MAALOX/MYLANTA) 200-200-20 MG/5ML suspension 30 mL  30 mL Oral Q4H PRN Laveda Abbe, NP   30 mL at 08/20/16 1812  . aspirin EC tablet 81 mg  81 mg Oral Daily Thresa Ross, MD   81 mg at 08/22/16 0739  . atenolol (TENORMIN) tablet 50 mg  50 mg Oral QPM Laveda Abbe, NP   50 mg at 08/21/16 1730  . calcium-vitamin D (OSCAL WITH D) 500-200 MG-UNIT per tablet 1 tablet  1 tablet Oral BID Cobos, Rockey Situ, MD   1 tablet at 08/22/16 0739  . feeding supplement (ENSURE ENLIVE) (ENSURE ENLIVE) liquid 237 mL  237 mL Oral BID BM Nira Conn A, NP   237 mL at 08/21/16 1438  . ferrous sulfate tablet 325 mg  325 mg Oral Q breakfast Thresa Ross, MD   325 mg at 08/22/16 0739  . fish oil-omega-3 fatty acids capsule 2 g  2 g Oral BID Thresa Ross, MD   2 g at 08/22/16 0742  . lisinopril (PRINIVIL,ZESTRIL) tablet 20 mg  20 mg Oral q1800 Cobos, Fernando A, MD   20 mg at 08/21/16 1730   And  . hydrochlorothiazide (MICROZIDE) capsule 12.5 mg  12.5 mg Oral q1800 Cobos, Fernando A, MD   12.5 mg at 08/21/16 1730  . hydrOXYzine (ATARAX/VISTARIL) tablet 25 mg  25 mg Oral TID PRN Laveda Abbe, NP   25 mg at 08/20/16 1812  . loratadine (CLARITIN) tablet 10 mg  10 mg Oral Daily Thresa Ross, MD   10 mg at  08/22/16 0739  . magnesium hydroxide (MILK OF MAGNESIA) suspension 30 mL  30 mL Oral Daily PRN Laveda Abbe, NP   30 mL at 08/22/16 0740  . ondansetron (ZOFRAN) tablet 4 mg  4 mg Oral Q8H PRN Thresa Ross, MD      . pantoprazole (PROTONIX) EC tablet 40 mg  40 mg Oral Daily Thresa Ross, MD   40 mg at 08/22/16 0740  . sertraline (ZOLOFT) tablet 50 mg  50 mg Oral Daily Thresa Ross, MD   50 mg at 08/22/16 0739  . simvastatin (ZOCOR) tablet 20 mg  20 mg Oral QPM Thresa Ross, MD   20 mg at 08/21/16 1730  . sucralfate (CARAFATE) 1 GM/10ML suspension 1 g  1 g Oral TID WC & HS Thresa Ross, MD   1 g at 08/22/16 0740  . traZODone (DESYREL) tablet 50 mg  50 mg Oral QHS PRN Laveda Abbe, NP   50 mg at 08/21/16 2114    PTA Medications: Prescriptions Prior to Admission  Medication Sig Dispense Refill Last Dose  . acetaminophen (TYLENOL) 500 MG tablet Take 1,000 mg by mouth 2 (two) times daily as needed (for pain.).   08/18/2016 at Unknown time  . ALPRAZolam (XANAX) 1 MG tablet Take 0.5-1 tablets (0.5-1 mg  total) by mouth 2 (two) times daily as needed for anxiety. 0.5 mg daily and 1 mg at bedtime. (Patient taking differently: Take 0.5-1 mg by mouth 3 (three) times daily. 0.5 mg BID and 1 mg at bedtime.) 30 tablet 0 08/19/2016 at Unknown time  . aspirin EC 81 MG tablet Take 81 mg by mouth at bedtime.    08/18/2016 at Unknown time  . atenolol (TENORMIN) 50 MG tablet Take 50 mg by mouth every evening.   0 08/18/2016 at 2100  . Calcium Carbonate-Vit D-Min (CALCIUM 600+D PLUS MINERALS PO) Take 1 tablet by mouth 2 (two) times daily.   08/18/2016 at Unknown time  . clonazePAM (KLONOPIN) 0.5 MG tablet Take 1 tablet (0.5 mg total) by mouth 2 (two) times daily. Take if you are having a panic attack spell--note that this is similar to ativan--follo with psychiatrist (Patient not taking: Reported on 08/19/2016) 30 tablet 0 Completed Course at Unknown time  . DULoxetine (CYMBALTA) 30 MG capsule Take 1  capsule (30 mg total) by mouth daily. (Patient taking differently: Take 30 mg by mouth 2 (two) times daily. ) 30 capsule 0 08/19/2016 at Unknown time  . escitalopram (LEXAPRO) 5 MG tablet Take 5 mg by mouth daily.  0 08/19/2016 at Unknown time  . fenofibrate (TRICOR) 145 MG tablet Take 145 mg by mouth every evening.    08/18/2016 at Unknown time  . ferrous sulfate 325 (65 FE) MG tablet Take 325 mg by mouth daily with breakfast. Take 2-3 times weekly due to side effects   Past Week at Unknown time  . fish oil-omega-3 fatty acids 1000 MG capsule Take 2 g by mouth 2 (two) times daily.    08/19/2016 at Unknown time  . lisinopril-hydrochlorothiazide (PRINZIDE,ZESTORETIC) 20-12.5 MG per tablet Take 1 tablet by mouth every evening.   0 08/18/2016 at Unknown time  . loratadine (CLARITIN) 10 MG tablet Take 10 mg by mouth 2 (two) times daily.    08/19/2016 at Unknown time  . Multiple Vitamin (MULTIVITAMIN WITH MINERALS) TABS tablet Take 1 tablet by mouth every morning. One-A-Day Women 50+   08/19/2016 at Unknown time  . omeprazole (PRILOSEC) 20 MG capsule Take 1 capsule (20 mg total) by mouth daily. 14 capsule 0 Past Week at Unknown time  . ondansetron (ZOFRAN) 4 MG tablet Take 1 tablet (4 mg total) by mouth every 8 (eight) hours as needed for nausea or vomiting. (Patient not taking: Reported on 08/19/2016) 30 tablet 0 Completed Course at Unknown time  . Probiotic Product (PROBIOTIC PO) Take 1 capsule by mouth every morning.    08/19/2016 at Unknown time  . simvastatin (ZOCOR) 20 MG tablet Take 20 mg by mouth every evening.    08/18/2016 at Unknown time  . sucralfate (CARAFATE) 1 GM/10ML suspension Take 10 mLs (1 g total) by mouth 4 (four) times daily -  with meals and at bedtime. 420 mL 0     Treatment Modalities: Medication Management, Group therapy, Case management,  1 to 1 session with clinician, Psychoeducation, Recreational therapy.  Patient Stressors: Loss of Husband and Sister  Patient Strengths: Ability for  insight Active sense of humor Average or above average intelligence Capable of independent living Occupational psychologist fund of knowledge Motivation for treatment/growth Physical Health Religious Affiliation Special hobby/interest Supportive family/friends  Physician Treatment Plan for Primary Diagnosis: mdd recurrent severe without psychosis Long Term Goal(s): Improvement in symptoms so as ready for discharge  Short Term Goals: Ability to identify changes in  lifestyle to reduce recurrence of condition will improve Ability to verbalize feelings will improve Ability to demonstrate self-control will improve Ability to maintain clinical measurements within normal limits will improve Compliance with prescribed medications will improve Ability to verbalize feelings will improve Ability to disclose and discuss suicidal ideas Ability to demonstrate self-control will improve  Medication Management: Evaluate patient's response, side effects, and tolerance of medication regimen.  Therapeutic Interventions: 1 to 1 sessions, Unit Group sessions and Medication administration.  Evaluation of Outcomes: Progressing  Physician Treatment Plan for Secondary Diagnosis: Active Problems:   MDD (major depressive disorder), recurrent severe, without psychosis ([place])   Long Term Goal(s): Improvement in symptoms so as ready for discharge  Short Term Goals: Ability to identify changes in lifestyle to reduce recurrence of condition will improve Ability to verbalize feelings will improve Ability to demonstrate self-control will improve Ability to maintain clinical measurements within normal limits will improve Compliance with prescribed medications will improve Ability to verbalize feelings will improve Ability to disclose and discuss suicidal ideas Ability to demonstrate self-control will improve  Medication Management: Evaluate patient's response, side effects, and tolerance  of medication regimen.  Therapeutic Interventions: 1 to 1 sessions, Unit Group sessions and Medication administration.  Evaluation of Outcomes: Progressing   RN Treatment Plan for Primary Diagnosis: mdd recurrent severe without psychosis Long Term Goal(s): Knowledge of disease and therapeutic regimen to maintain health will improve  Short Term Goals: Ability to remain free from injury will improve and Ability to demonstrate self-control  Medication Management: RN will administer medications as ordered by provider, will assess and evaluate patient's response and provide education to patient for prescribed medication. RN will report any adverse and/or side effects to prescribing provider.  Therapeutic Interventions: 1 on 1 counseling sessions, Psychoeducation, Medication administration, Evaluate responses to treatment, Monitor vital signs and CBGs as ordered, Perform/monitor CIWA, COWS, AIMS and Fall Risk screenings as ordered, Perform wound care treatments as ordered.  Evaluation of Outcomes: Progressing   LCSW Treatment Plan for Primary Diagnosis: mdd recurrent severe without psychosis Long Term Goal(s): Safe transition to appropriate next level of care at discharge, Engage patient in therapeutic group addressing interpersonal concerns.  Short Term Goals: Engage patient in aftercare planning with referrals and resources, Identify triggers associated with mental health/substance abuse issues and Increase skills for wellness and recovery  Therapeutic Interventions: Assess for all discharge needs, 1 to 1 time with Social worker, Explore available resources and support systems, Assess for adequacy in community support network, Educate family and significant other(s) on suicide prevention, Complete Psychosocial Assessment, Interpersonal group therapy.  Evaluation of Outcomes: Not Met   Progress in Treatment: Attending groups: Yes Participating in groups: Yes  Taking medication as  prescribed: Yes, MD continues to assess for medication changes as needed Toleration medication: Yes, no side effects reported at this time Family/Significant other contact made: No, CSW attempting to make contact with son Patient understands diagnosis: Continuing to assess Discussing patient identified problems/goals with staff: Yes Medical problems stabilized or resolved: Yes Denies suicidal/homicidal ideation: Yes Issues/concerns per patient self-inventory: None Other: N/A  New problem(s) identified: None identified at this time.   New Short Term/Long Term Goal(s): "decrease my anxiety and depression"  Discharge Plan or Barriers: Pt plans to return home and follow up with outpatient as well as [facility] for grief therapy  Reason for Continuation of Hospitalization: Anxiety Depression Medication stabilization  Estimated Length of Stay: 2-3 days; est DC date of 8/8  Attendees: Patient: Sandy Hodges  08/22/2016  11:15 AM  Physician: Dr. Parke Poisson 08/22/2016  11:15 AM  Nursing: Darrol Angel, RN 08/22/2016  11:15 AM  RN Care Manager: Lars Pinks, RN 08/22/2016  11:15 AM  Social Worker: Adriana Reams, LCSW 08/22/2016  11:15 AM  Recreational Therapist:  08/22/2016  11:15 AM  Other: Lindell Spar, NP; Marvia Pickles, NP 08/22/2016  11:15 AM  Other:  08/22/2016  11:15 AM  Other: 08/22/2016  11:15 AM    Scribe for Treatment Team: Gladstone Lighter, LCSW 08/22/2016 11:15 AM

## 2016-08-22 NOTE — Progress Notes (Signed)
D: Pt presents anxious on approach. Pt reports decreased depression and anxiety. Pt denies suicidal thoughts. Pt reports fair sleep at bedtime. Pt verbalized tolerating her meds well. Pt continues to utilize her coping skills to mange anxiety. Pt identifies reading and deep breathing techniques as coping mechanism for periods of increased anxiety. Pt observed smiling today and engaging with other pts on the milieu.  A: Medications reviewed with pt. Medications administered as ordered per MD. Verbal support provided. Pt encouraged to attend groups. 15 minute checks performed for safety. R: Pt receptive to tx.

## 2016-08-22 NOTE — Progress Notes (Signed)
[facility] MD Progress Note  08/22/2016 4:43 PM Sandy Hodges  MRN:  [id number] Subjective:  Patient reports she is feeling better than on admission and at this time denies any suicidal ideations. She is future oriented and currently hoping to be discharged soon. Objective : I have discussed case with treatment team and have met with patient. 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 States that she had experienced grief and loss after her husband died , but that she felt more severely depressed after the surgery. She presented to the hospital on 8/3 due to worsening depression , passive SI, and worsening anxiety, panic attacks. At this time patient is reporting feeling better, less depressed, less anxious, and denies any active SI. She has been participating in groups and behavior on unit is calm and in good control. She denies medication side effects   Principal Problem:  MDD Diagnosis:   Patient Active Problem List   Diagnosis Date Noted  . MDD (major depressive disorder), recurrent severe, without psychosis ([place]) [F33.2] 07/14/2016  . 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 Nausea [R11.0]   . Anxiety and depression [F41.9, F32.9]   . Abdominal pain [R10.9] 07/13/2016  . Primary localized osteoarthritis of left knee [M17.12] 06/24/2016  . Primary localized osteoarthritis of right knee [M17.11] 10/16/2015  . Depression [F32.9] 10/16/2014  . Arthritis [M19.90] 11/14/2013  . Essential hypertension [I10] 11/14/2013  . Preventive measure [Z29.9] 11/14/2013  . Uterine mass [N85.9] 08/01/2013  . Deficiency anemia [D53.9] 07/09/2013  . Fatigue [R53.83] 07/09/2013  . Knee pain [M25.569] 07/09/2013  . Back pain [M54.9] 07/09/2013  . Abdominal pain, unspecified site [R10.9] 07/09/2013  . Leukopenia [D72.819] 07/09/2013  . Flank pain [R10.9] 07/09/2013  . Pancytopenia [facility]) [D61.818] 12/14/2011   Total Time spent with patient: 20  minutes  Past Psychiatric History: depression   Past Medical History:  Past Medical History:  Diagnosis Date  . Abdominal pain, unspecified site 07/09/2013  . Anxiety   . Arthritis 11/14/2013  . Back pain 07/09/2013  . Blood dyscrasia    Pancytopenial; followed at [facility] - Dr. Alvy Bimler  . Depression 10/16/2014  . Essential hypertension 11/14/2013  . Fatigue 07/09/2013  . Fibromyalgia   . Hyperlipidemia   . Hypertension   . Knee pain 07/09/2013  . OA (osteoarthritis) of knee   . Pancytopenia ([place]) 12/14/2011  . Pancytopenia ([place])   . PONV (postoperative nausea and vomiting)    nausea an vomiting  . Unspecified deficiency anemia 07/09/2013    Past Surgical History:  Procedure Laterality Date  . COLONOSCOPY    . JOINT REPLACEMENT    . 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 ROTATOR CUFF REPAIR Right 09/2014  . TOTAL KNEE ARTHROPLASTY Right 10/16/2015   Procedure: TOTAL KNEE ARTHROPLASTY;  Surgeon: Earlie Server, MD;  Location: [facility];  Service: Orthopedics;  Laterality: Right;  . TOTAL KNEE ARTHROPLASTY Left 06/24/2016   Procedure: LEFT TOTAL KNEE ARTHROPLASTY;  Surgeon: Earlie Server, MD;  Location: [place];  Service: Orthopedics;  Laterality: Left;   Family History:  Family History  Problem Relation Age of Onset  . Cancer Mother        breast ca  . Hypertension Father   . CVA Father     Social History:  History  Alcohol Use No     History  Drug Use No    Social History   Social History  .  Marital status: Widowed    Spouse name: N/A  . Number of children: N/A  . Years of education: N/A   Social History Main Topics  . Smoking status: Never Smoker  . Smokeless tobacco: Never Used  . Alcohol use No  . Drug use: No  . Sexual activity: Not Asked   Other Topics Concern  . None   Social History Narrative  . None   Additional Social History:    Pain Medications: see MAR Prescriptions: see MAR Over the Counter: see MAR History of alcohol /  drug use?: No history of alcohol / drug abuse  Sleep: Fair- states it is improving   Appetite:  improved  Current Medications: Current Facility-Administered Medications  Medication Dose Route Frequency Provider Last Rate Last Dose  . acetaminophen (TYLENOL) tablet 650 mg  650 mg Oral Q6H PRN Ethelene Hal, NP   650 mg at 08/21/16 2114  . ALPRAZolam Duanne Moron) tablet 0.5-1 mg  0.5-1 mg Oral BID PRN Analisia Kingsford, Myer Peer, MD   1 mg at 08/21/16 1539  . alum & mag hydroxide-simeth (MAALOX/MYLANTA) 200-200-20 MG/5ML suspension 30 mL  30 mL Oral Q4H PRN Ethelene Hal, NP   30 mL at 08/20/16 1812  . aspirin EC tablet 81 mg  81 mg Oral Daily Merian Capron, MD   81 mg at 08/22/16 0739  . atenolol (TENORMIN) tablet 50 mg  50 mg Oral QPM Ethelene Hal, NP   50 mg at 08/21/16 1730  . calcium-vitamin D (OSCAL WITH D) 500-200 MG-UNIT per tablet 1 tablet  1 tablet Oral BID Marvis Bakken, Myer Peer, MD   1 tablet at 08/22/16 0739  . feeding supplement (ENSURE ENLIVE) (ENSURE ENLIVE) liquid 237 mL  237 mL Oral BID BM Lindon Romp A, NP   237 mL at 08/21/16 1438  . ferrous sulfate tablet 325 mg  325 mg Oral Q breakfast Merian Capron, MD   325 mg at 08/22/16 0739  . fish oil-omega-3 fatty acids capsule 2 g  2 g Oral BID Merian Capron, MD   2 g at 08/22/16 0742  . lisinopril (PRINIVIL,ZESTRIL) tablet 20 mg  20 mg Oral q1800 Juliene Kirsh A, MD   20 mg at 08/21/16 1730   And  . hydrochlorothiazide (MICROZIDE) capsule 12.5 mg  12.5 mg Oral q1800 Mattthew Ziomek A, MD   12.5 mg at 08/21/16 1730  . hydrOXYzine (ATARAX/VISTARIL) tablet 25 mg  25 mg Oral TID PRN Ethelene Hal, NP   25 mg at 08/20/16 1812  . loratadine (CLARITIN) tablet 10 mg  10 mg Oral Daily Merian Capron, MD   10 mg at 08/22/16 0739  . magnesium hydroxide (MILK OF MAGNESIA) suspension 30 mL  30 mL Oral Daily PRN Ethelene Hal, NP   30 mL at 08/22/16 0740  . ondansetron (ZOFRAN) tablet 4 mg  4 mg Oral Q8H PRN Merian Capron, MD      . pantoprazole (PROTONIX) EC tablet 40 mg  40 mg Oral Daily Merian Capron, MD   40 mg at 08/22/16 0740  . sertraline (ZOLOFT) tablet 50 mg  50 mg Oral Daily Merian Capron, MD   50 mg at 08/22/16 0739  . simvastatin (ZOCOR) tablet 20 mg  20 mg Oral QPM Merian Capron, MD   20 mg at 08/21/16 1730  . sucralfate (CARAFATE) 1 GM/10ML suspension 1 g  1 g Oral TID WC & HS Merian Capron, MD   1 g at 08/22/16 1158  . traZODone (DESYREL)  tablet 50 mg  50 mg Oral QHS PRN Ethelene Hal, NP   50 mg at 08/21/16 2114    Lab Results:  No results found for this or any previous visit (from the past 48 hour(s)).  Blood Alcohol level:  Lab Results  Component Value Date   ETH <5 14/97/0263    Metabolic Disorder Labs: No results found for: HGBA1C, MPG No results found for: PROLACTIN No results found for: CHOL, TRIG, HDL, CHOLHDL, VLDL, LDLCALC  Physical Findings: AIMS: Facial and Oral Movements Muscles of Facial Expression: None, normal Lips and Perioral Area: None, normal Jaw: None, normal Tongue: None, normal,Extremity Movements Upper (arms, wrists, hands, fingers): None, normal Lower (legs, knees, ankles, toes): None, normal, Trunk Movements Neck, shoulders, hips: None, normal, Overall Severity Severity of abnormal movements (highest score from questions above): None, normal Incapacitation due to abnormal movements: None, normal Patient's awareness of abnormal movements (rate only patient's report): No Awareness, Dental Status Current problems with teeth and/or dentures?: No Does patient usually wear dentures?: No  CIWA:    COWS:     Musculoskeletal: Strength & Muscle Tone: within normal limits Gait & Station: normal Patient leans: no lean  Psychiatric Specialty Exam: Physical Exam  Review of Systems  Cardiovascular: Negative for chest pain.  Skin: Negative for rash.  Psychiatric/Behavioral: Positive for depression.  denies chest pain, no shortness of breath,  no vomiting   Blood pressure 95/63, pulse 73, temperature 99.1 F (37.3 C), temperature source Oral, resp. rate 16, height '5\' 3"'$  (1.6 m), weight 64 kg (141 lb), SpO2 99 %.Body mass index is 24.98 kg/m.  General Appearance: Well Groomed  Eye Contact:  Good  Speech:  Normal Rate  Volume:  Normal  Mood:  reports feeling better  Affect:  reactive, smiles at times appropriately   Thought Process:  Goal Directed and Descriptions of Associations: Intact  Orientation:  Other:  fully alert and attentive   Thought Content:  denies hallucinations, no delusions, not internally preoccupied   Suicidal Thoughts:  No denies suicidal or self injurious ideations, denies homicidal or violent ideations  Homicidal Thoughts:  No  Memory:  recent and remote grossly intact   Judgement:  Other:  improving   Insight:  improving   Psychomotor Activity:  Normal  Concentration:  Concentration: Good and Attention Span: Good  Recall:  Good  Fund of Knowledge:  Good  Language:  Good  Akathisia:  Negative  Handed:  Right  AIMS (if indicated):     Assets:  Desire for Improvement  ADL's:  Intact  Cognition:  WNL  Sleep:  Number of Hours: 4   Assessment - patient reports she is feeling better, and at this time is focused on being discharged soon. Denies suicidal ideations at this time. Reports she was recently on Cymbalta, which was discontinued. She is now on Zoloft trial, denies side effects thus far. Of note, patient reports long term management of anxiety with Xanax, at 0.5 mgrs BID and 1 mgr QHS. Not currently presenting with any withdrawal symptoms- no tremors, no diaphoresis, no restlessness, and vitals stable.  Treatment Plan Summary: Treatment plan reviewed as below today 8/6  Daily contact with patient to assess and evaluate symptoms and progress in treatment, Medication management and Plan as follows  Encourage group and milieu participation to work on coping skills and symptom reduction Continue Zoloft  50 mgrs QDAY for depression and anxiety  Continue Xanax at 0.5 mgrs TID- hold if sedated ( for anxiety) . Have  reviewed with patient potential long term side effects and complications of BZD management and encouraged her to consider tapering off . She declined detox at this time, but agrees to start gradual taper.  Treatment team working on disposition planning options    Jenne Campus, MD 08/22/2016, 4:43 PMPatient ID: Sandy Hodges, female   DOB: 21-Sep-1945, 71 y.o.   MRN: [id number]

## 2016-08-22 NOTE — Progress Notes (Signed)
D: Pt was in her room upon initial approach.  Pt presents with depressed affect and mood.  She smiles briefly while talking to Clinical research associatewriter.  Describes her day as "pretty good" but reports having "anxiety about mid evening."  Pt reports having a good visit with her son and daughter-in-law tonight.  She discussed how her husband passed away at the end of last year and she had a knee replacement in June.  Pt reports increased anxiety and depression since knee replacement.  Her goal is "hoping I get better."  Pt denies SI/HI, denies hallucinations, reports L knee pain of 2/10.  Pt has been visible in milieu interacting with peers and staff appropriately.  Pt attended evening group.    A: Introduced self to pt.  Actively listened to pt and offered support and encouragement. Medication administered per order.  PRN medication administered for pain.  Cold packs provided for pain.  Q15 minute safety checks maintained.  R: Pt is safe on the unit.  Pt is compliant with medications.  Pt verbally contracts for safety.  Will continue to monitor and assess.

## 2016-08-22 NOTE — [facility] Group Notes (Addendum)
[facility] LCSW Group Therapy Note  Date/Time:  08/22/2016 1:30 PM  Type of Therapy/Topic:  Group Therapy:  Balance in Life  Participation Level:  Active, attentive  Description of Group:    This group will address the concept of balance and how it feels and looks when one is unbalanced. Patients will be encouraged to process areas in their lives that are out of balance, and identify reasons for remaining unbalanced. Facilitators will guide patients utilizing problem- solving interventions to address and correct the stressor making their life unbalanced. Understanding and applying boundaries will be explored and addressed for obtaining  and maintaining a balanced life. Patients will be encouraged to explore ways to assertively make their unbalanced needs known to significant others in their lives, using other group members and facilitator for support and feedback.  Therapeutic Goals: 1. Patient will identify two or more emotions or situations they have that consume much of in their lives. 2. Patient will identify signs/triggers that life has become out of balance:  3. Patient will identify two ways to set boundaries in order to achieve balance in their lives:  4. Patient will demonstrate ability to communicate their needs through discussion and/or role plays  Summary of Patient Progress:  Patient relates loss of balance in her life as related to recent death of her husband and then "surgery 4 months later, Ill never do that again."  States that she feels balanced when w family, grandchildren and church family."  "Im normally a happy person, but I do worry."  States she has learned to "not take everything so seriously and not worry about things that may not happen."  Reports significant support from community, and is usually content with "what I have."  Feels this is a temporary state brought on by stress of death of husband and surgery, confident that "things will get better."    Therapeutic Modalities:    Cognitive Behavioral Therapy Solution-Focused Therapy Assertiveness Training  Santa GeneraAnne Breyden Jeudy, LCSW Lead Clinical Social Worker Phone:  [phone]

## 2016-08-22 NOTE — Progress Notes (Signed)
Pt mentioned she had a bad day and was very depressed. Pt wants to learn coping skills to help her when she is anxious or depressed. This Clinical research associatewriter provided pt with a list of coping mechanism to help out. This Clinical research associatewriter also provided pt with anxiety coloring sheets to help her as well.

## 2016-08-23 DIAGNOSIS — G4700 Insomnia, unspecified: Secondary | ICD-10-CM

## 2016-08-23 MED ORDER — TRAZODONE HCL 50 MG PO TABS
25.0000 mg | ORAL_TABLET | Freq: Every evening | ORAL | Status: DC | PRN
  Administered 2016-08-23 – 2016-08-24 (×2): 25 mg via ORAL
  Filled 2016-08-23 (×2): qty 1

## 2016-08-23 NOTE — [facility] Group Notes (Signed)
[facility] Group Therapy 08/23/2016 1:15pm  Type of Therapy: [employer] Presentation  Participation Level: Active  Participation Quality: Attentive  Affect: Appropriate  Cognitive: Oriented  Insight: Developing/Improving  Engagement in Therapy: Engaged  Modes of Intervention: Discussion, Education and Socialization  Summary of Progress/Problems: [employer] ([facility]) Speaker came to talk about his personal journey with living with a mental health diagnosis.The pt processed ways by which to relate to the speaker. MHA speaker provided handouts and educational information pertaining to groups and services offered by the MHA. Pt was engaged in speaker's presentation and was receptive to resources provided.    Searcy Miyoshi B. Ralynn San, MSW, LCSWA 08/23/2016 2:44 PM   

## 2016-08-23 NOTE — Progress Notes (Signed)
[facility]  08/23/2016 3:11 PM [name]  MRN:  [id number] Subjective:  "I feel much better today. I was upset at first about taking the Xanax at a lower dose but I understand there are risks and it makes sense."   Objective: Pt seen and chart reviewed. Pt is alert/oriented x4, calm, cooperative, and appropriate to situation. Pt denies suicidal/homicidal ideation and psychosis and does not appear to be responding to internal stimuli. Pt presents with severe yet improving anxiety state and reports that she has had a lot of anxiety managing her xanax. Pt is able to self-soothe and states that she knows long-term that xanax is not the ideal medication as it has a short half-life and time of action. Pt reports that she is responding well to the Zoloft.  MDD (major depressive disorder), recurrent severe, without psychosis [facility](HCC)  Patient Active Problem List   Diagnosis Date Noted  . MDD (major depressive disorder), recurrent severe, without psychosis (HCC) [F33.2] 07/14/2016    Priority: High  . 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 Nausea [R11.0]   . Anxiety and depression [F41.9, F32.9]   . Abdominal pain [R10.9] 07/13/2016  . Primary localized osteoarthritis of left knee [M17.12] 06/24/2016  . Primary localized osteoarthritis of right knee [M17.11] 10/16/2015  . Depression [F32.9] 10/16/2014  . Arthritis [M19.90] 11/14/2013  . Essential hypertension [I10] 11/14/2013  . Preventive measure [Z29.9] 11/14/2013  . Uterine mass [N85.9] 08/01/2013  . Deficiency anemia [D53.9] 07/09/2013  . Fatigue [R53.83] 07/09/2013  . Knee pain [M25.569] 07/09/2013  . Back pain [M54.9] 07/09/2013  . Abdominal pain, unspecified site [R10.9] 07/09/2013  . Leukopenia [D72.819] 07/09/2013  . Flank pain [R10.9] 07/09/2013  . Pancytopenia [facility](HCC) [O13.086][D61.818] 12/14/2011   Total Time spent with patient: 25 minutes  Past Psychiatric History: depression   Past Medical History:  Past Medical  History:  Diagnosis Date  . Abdominal pain, unspecified site 07/09/2013  . Anxiety   . Arthritis 11/14/2013  . Back pain 07/09/2013  . Blood dyscrasia    [facility]ial[facility] Dr[facility] Bertis Ruddy[facility]orsuch  . Depression 10/16/2014  . Essential hypertension 11/14/2013  . Fatigue 07/09/2013  . Fibromyalgia   . Hyperlipidemia   . Hypertension   . Knee pain 07/09/2013  . OA (osteoarthritis) of knee   . Pancytopenia (HCC) 12/14/2011  . Pancytopenia (HCC)   . PONV (postoperative nausea and vomiting)    nausea an vomiting  . Unspecified deficiency anemia 07/09/2013    Past Surgical History:  Procedure Laterality Date  . COLONOSCOPY    . JOINT REPLACEMENT    . 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 ROTATOR CUFF REPAIR Right 09/2014  . 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 TOTAL KNEE ARTHROPLASTY Left 06/24/2016   Procedure: LEFT TOTAL KNEE ARTHROPLASTY;  Surgeon: Frederico Hammanaffrey, Daniel, MD;  [facility];  Service: Orthopedics;  Laterality: Left;   Family History:  Family History  Problem Relation Age of Onset  . Cancer Mother        breast ca  . Hypertension Father   . CVA Father     Social History:  History  Alcohol Use No     History  Drug Use No    Social History   Social History  . Marital status: Widowed    Spouse name: N/A  .  Number of children: N/A  . Years of education: N/A   Social History Main Topics  . Smoking status: Never Smoker  . Smokeless tobacco: Never Used  . Alcohol use No  . Drug use: No  . Sexual activity: Not Asked   Other Topics Concern  . None   Social History Narrative  . None   Additional Social History:    Pain Medications: see MAR Prescriptions: see MAR Over the Counter: see MAR History of alcohol / drug use?: No history of alcohol / drug abuse  Sleep: Fair- states it is improving    Appetite:  improved  Current Medications: Current Facility-Administered Medications  Medication Dose Route Frequency Provider Last Rate Last Dose  . acetaminophen (TYLENOL) tablet 650 mg  650 mg Oral Q6H PRN Laveda Abbe, NP   650 mg at 08/22/16 2123  . ALPRAZolam Prudy Feeler) tablet 0.5 mg  0.5 mg Oral TID Cobos, Rockey Situ, MD   0.5 mg at 08/23/16 1151  . alum & mag hydroxide-simeth (MAALOX/MYLANTA) 200-200-20 MG/5ML suspension 30 mL  30 mL Oral Q4H PRN Laveda Abbe, NP   30 mL at 08/20/16 1812  . aspirin EC tablet 81 mg  81 mg Oral Daily Thresa Ross, MD   81 mg at 08/23/16 0755  . atenolol (TENORMIN) tablet 50 mg  50 mg Oral QPM Laveda Abbe, NP   50 mg at 08/22/16 1703  . calcium-vitamin D (OSCAL WITH D) 500-200 MG-UNIT per tablet 1 tablet  1 tablet Oral BID Cobos, Rockey Situ, MD   1 tablet at 08/23/16 0755  . feeding supplement (ENSURE ENLIVE) (ENSURE ENLIVE) liquid 237 mL  237 mL Oral BID BM Nira Conn A, NP   237 mL at 08/23/16 1008  . ferrous sulfate tablet 325 mg  325 mg Oral Q breakfast Thresa Ross, MD   325 mg at 08/22/16 0739  . fish oil-omega-3 fatty acids capsule 2 g  2 g Oral BID Thresa Ross, MD   2 g at 08/23/16 0755  . lisinopril (PRINIVIL,ZESTRIL) tablet 20 mg  20 mg Oral q1800 Cobos, Fernando A, MD   20 mg at 08/22/16 1703   And  . hydrochlorothiazide (MICROZIDE) capsule 12.5 mg  12.5 mg Oral q1800 Cobos, Rockey Situ, MD   12.5 mg at 08/22/16 1703  . loratadine (CLARITIN) tablet 10 mg  10 mg Oral Daily Thresa Ross, MD   10 mg at 08/23/16 0755  . magnesium hydroxide (MILK OF MAGNESIA) suspension 30 mL  30 mL Oral Daily PRN Laveda Abbe, NP   30 mL at 08/22/16 0740  . ondansetron (ZOFRAN) tablet 4 mg  4 mg Oral Q8H PRN Thresa Ross, MD      . pantoprazole (PROTONIX) EC tablet 40 mg  40 mg Oral Daily Thresa Ross, MD   40 mg at 08/23/16 0754  . sertraline (ZOLOFT) tablet 50 mg  50 mg Oral Daily Thresa Ross, MD   50 mg at  08/23/16 0754  . simvastatin (ZOCOR) tablet 20 mg  20 mg Oral QPM Thresa Ross, MD   20 mg at 08/22/16 1703  . sucralfate (CARAFATE) 1 GM/10ML suspension 1 g  1 g Oral TID WC & HS Thresa Ross, MD   1 g at 08/23/16 1151    Lab Results:  No results found for this or any previous visit (from the past 48 hour(s)).  Blood Alcohol level:  Lab Results  Component Value Date   [facility] <5 08/19/2016    Metabolic  Disorder Labs: No results found for: HGBA1C, MPG No results found for: PROLACTIN No results found for: CHOL, TRIG, HDL, CHOLHDL, VLDL, LDLCALC  Physical Findings: AIMS: Facial and Oral Movements Muscles of Facial Expression: None, normal Lips and Perioral Area: None, normal Jaw: None, normal Tongue: None, normal,Extremity Movements Upper (arms, wrists, hands, fingers): None, normal Lower (legs, knees, ankles, toes): None, normal, Trunk Movements Neck, shoulders, hips: None, normal, Overall Severity Severity of abnormal movements (highest score from questions above): None, normal Incapacitation due to abnormal movements: None, normal Patient's awareness of abnormal movements (rate only patient's report): No Awareness, Dental Status Current problems with teeth and/or dentures?: No Does patient usually wear dentures?: No  CIWA:    COWS:     Musculoskeletal: Strength & Muscle Tone: within normal limits Gait & Station: normal Patient leans: no lean  Psychiatric Specialty Exam: Physical Exam  Review of Systems  Cardiovascular: Negative for chest pain.  Skin: Negative for rash.  Psychiatric/Behavioral: Positive for depression. Negative for hallucinations, substance abuse and suicidal ideas. The patient is nervous/anxious and has insomnia.   All other systems reviewed and are negative. denies chest pain, no shortness of breath, no vomiting   Blood pressure (!) 150/72, pulse 88, temperature 98.7 F (37.1 C), temperature source Oral, resp. rate 16, height 5\' 3"  (1.6 m), weight  64 kg (141 lb), SpO2 99 %.Body mass index is 24.98 kg/m.  General Appearance: Well Groomed, casual, in NAD  Eye Contact:  Good  Speech:  Normal Rate, clear, coherent  Volume:  Normal  Mood:  reports feeling better  Affect:  reactive, smiles at times appropriately   Thought Process:  Goal Directed and Descriptions of Associations: Intact  Orientation:  Other:  fully alert and attentive   Thought Content:  denies hallucinations, no delusions, not internally preoccupied   Suicidal Thoughts:  No and is able to contract for safety  Homicidal Thoughts:  No  Memory:  recent and remote grossly intact   Judgement:  Other:  improving   Insight:  improving   Psychomotor Activity:  Normal  Concentration:  Concentration: Good and Attention Span: Good  Recall:  Good  Fund of Knowledge:  Good  Language:  Good  Akathisia:  Negative  Handed:  Right  AIMS (if indicated):     Assets:  Desire for Improvement  ADL's:  Intact  Cognition:  WNL  Sleep:  Number of Hours: 6.75   Treatment Plan Summary: MDD (major depressive disorder), recurrent severe, without psychosis (HCC) unstable yet improving, with improvement in anxiety although poor sleep, managed as below:   Treatment plan reviewed as below today 08/23/16 Daily contact with patient to assess and evaluate symptoms and progress in treatment, Medication management and Plan as follows  Encourage group and milieu participation to work on coping skills and symptom reduction Continue Zoloft 50 mgrs QDAY for depression and anxiety  Continue Xanax at 0.5 mgrs TID- hold if sedated ( for anxiety). Have reviewed with patient potential long term side effects and complications of BZD management and encouraged her to consider tapering off. She declined detox at this time, but agrees to start gradual taper.  Treatment team working on disposition planning options.    -Start trazodone 50mg  po qhs prn insomnia   Beau Fanny, FNP 08/23/2016, 3:11 PM   Agree  with NP Progress Note

## 2016-08-23 NOTE — Progress Notes (Signed)
Adult Psychoeducational Group Note  Date:  08/23/2016 Time:  0845 am  Group Topic/Focus:  Recovery Goals:   The focus of this group is to identify appropriate goals for recovery and establish a plan to achieve them.  Participation Level:  Active  Participation Quality:  Appropriate and Attentive  Affect:  Appropriate  Cognitive:  Alert and Appropriate  Insight: Appropriate  Engagement in Group:  Engaged  Modes of Intervention:  Discussion and Orientation  Additional Comments:

## 2016-08-23 NOTE — Progress Notes (Signed)
D: Pt presents anxious on approach. Pt fidgety and constantly worrying about the fact that her Xanax was decreased yesterday. Pt verbalized that this is the wrong time to have her Xanax decreased because she's still grieving and is afraid that she will discharge home and end up back in a depressive state. Pt verbalized how lonely she is when she's home because her family is at work or have other things going on during the day and can't come to visit. Pt verbalized that she lacks motivation to perform her ADL's and have little interest to engage in activities when she's home alone because she's so depressed. Pt became tearful while conveying her thoughts this morning and appeared to be afraid of returning home alone.  A: Medications reviewed with pt. Medications administered as ordered per MD. Verbal support provided. Writer encouraged pt to utilize coping skills to help distract her from constantly worrying this morning. One on one support provided. MD made aware of pt complaints in tx team.15 minute checks performed for safety. R: Pt compliant with tx.

## 2016-08-24 NOTE — Progress Notes (Signed)
Patient ID: Sandy Hodges, female   DOB: 08/02/1945, 71 y.o.   MRN: [id number]  Pt currently presents with a pelasant affect and anxious behavior. Pt reports to writer that their goal is to "go home and get adjusted." Pt reports good sleep with current medication regimen.   Pt provided with medications per providers orders. Pt's labs and vitals were monitored throughout the night. Pt given a 1:1 about emotional and mental status. Pt supported and encouraged to express concerns and questions. Pt educated on medications.  Pt's safety ensured with 15 minute and environmental checks. Pt currently denies SI/HI and A/V hallucinations. Pt verbally agrees to seek staff if SI/HI or A/VH occurs and to consult with staff before acting on any harmful thoughts. Will continue POC.

## 2016-08-24 NOTE — Progress Notes (Signed)
Recreation Therapy Notes  Date: 08/24/2016 Time: 9:30am Location: [address] Dayroom  Group Topic: Stress Management  Goal Area(s) Addresses:  Patient will verbalize importance of using healthy stress management.  Patient will identify positive emotions associated with healthy stress management.   Intervention: Stress Management  Activity : Guided Energy Starter. Recreation Therapy Intern introduced the stress management technique of a guided energy starter. Recreation Therapy Intern read a script that allowed patients to work on stretching and relaxing some muscles to help them feel energized. Recreation Therapy Intern played calming music. Patients were to follow along as script was read to engage in the activity.  Education: Stress Management, Discharge Planning.   Education Outcome: Needs additional edcuation  Clinical Observations/Feedback: Pt did not attend group.  Rachel Meyer, Recreation Therapy Intern   Karam Dunson, LRT/CTRS  

## 2016-08-24 NOTE — Progress Notes (Signed)
D: Pt was in the hallway with visitors upon initial approach.  Pt presents with depressed affect and mood.  She reports her day "started off pretty rough" and states "I'm fine now."  She reports she had a good visit with her son and daughter-in-law tonight.  Pt states "I'm ready to go home."  Pt denies SI/HI, denies hallucinations, denies pain.  Pt has been visible in milieu interacting with peers and staff appropriately.  Pt attended evening group.    A: Introduced self to pt.  Actively listened to pt and offered support and encouragement. Medication administered per order.  PRN medication administered for sleep.  Q15 minute safety checks maintained.  R: Pt is safe on the unit.  Pt is compliant with medications.  Pt verbally contracts for safety.  Will continue to monitor and assess.

## 2016-08-24 NOTE — Progress Notes (Signed)
Adult Psychoeducational Group Note  Date:  08/24/2016 Time:  12:32 AM  Group Topic/Focus:  Wrap-Up Group:   The focus of this group is to help patients review their daily goal of treatment and discuss progress on daily workbooks.  Participation Level:  Active  Participation Quality:  Appropriate  Affect:  Appropriate  Cognitive:  Appropriate  Insight: Good  Engagement in Group:  Engaged  Modes of Intervention:  Activity  Additional Comments:  Pt rated her day a 6. Pt stated she was having anxiety attacks before arriving at the hospital Pt feels better due to medications  Sandy Hodges 08/24/2016, 12:32 AM

## 2016-08-24 NOTE — Plan of Care (Signed)
Problem: Activity: Goal: Sleeping patterns will improve Outcome: Progressing Slept 6.75 hours last night according to flowsheet.    

## 2016-08-24 NOTE — Progress Notes (Signed)
[facility] MD Progress Note  08/24/2016 6:04 PM Sandy Hodges  MRN:  [id number] Subjective: Patient reports she is feeling better, and is hoping for discharge soon. At this time denies medication side effects.  Objective:  I have discussed case with treatment team and have met with patient. She presents with improving mood and range of affect . Denies suicidal ideations . Denies medication side effects at this time. Of note, CSW reports she spoke with patient's son Kela Millin) , who expressed concern about patient's depression, lack of motivation prior to admission and worsening social isolation. CSW states son expressed concern that encouraging patient to be more active,sociable, less isolative had not worked well . Patient acknowledges she was isolating after her recent knee surgery, but states that she is now feeling better, and plans to be more active, more sociable, and states she plans to return to church regularly. She states " I realize it is not good to isolate, my son is right".  Denies medication side effects. No disruptive or agitated behaviors on unit, going to groups, noted to be interacting appropriately with peers and pleasant, calm on approach.  MDD (major depressive disorder), recurrent severe, without psychosis [facility])  Patient Active Problem List   Diagnosis Date Noted  . MDD (major depressive disorder), recurrent severe, without psychosis ([place]) [F33.2] 07/14/2016  . 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 Nausea [R11.0]   . Anxiety and depression [F41.9, F32.9]   . Abdominal pain [R10.9] 07/13/2016  . Primary localized osteoarthritis of left knee [M17.12] 06/24/2016  . Primary localized osteoarthritis of right knee [M17.11] 10/16/2015  . Depression [F32.9] 10/16/2014  . Arthritis [M19.90] 11/14/2013  . Essential hypertension [I10] 11/14/2013  . Preventive measure [Z29.9] 11/14/2013  . Uterine mass [N85.9] 08/01/2013  . Deficiency anemia [D53.9] 07/09/2013  . Fatigue  [R53.83] 07/09/2013  . Knee pain [M25.569] 07/09/2013  . Back pain [M54.9] 07/09/2013  . Abdominal pain, unspecified site [R10.9] 07/09/2013  . Leukopenia [D72.819] 07/09/2013  . Flank pain [R10.9] 07/09/2013  . Pancytopenia [facility]) [D61.818] 12/14/2011   Total Time spent with patient: 20 minutes  Past Psychiatric History: depression   Past Medical History:  Past Medical History:  Diagnosis Date  . Abdominal pain, unspecified site 07/09/2013  . Anxiety   . Arthritis 11/14/2013  . Back pain 07/09/2013  . Blood dyscrasia    Pancytopenial; followed at [facility] - Dr. Alvy Bimler  . Depression 10/16/2014  . Essential hypertension 11/14/2013  . Fatigue 07/09/2013  . Fibromyalgia   . Hyperlipidemia   . Hypertension   . Knee pain 07/09/2013  . OA (osteoarthritis) of knee   . Pancytopenia ([place]) 12/14/2011  . Pancytopenia ([place])   . PONV (postoperative nausea and vomiting)    nausea an vomiting  . Unspecified deficiency anemia 07/09/2013    Past Surgical History:  Procedure Laterality Date  . COLONOSCOPY    . JOINT REPLACEMENT    . 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 ROTATOR CUFF REPAIR Right 09/2014  . 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 TOTAL KNEE ARTHROPLASTY Left 06/24/2016   Procedure: LEFT TOTAL KNEE ARTHROPLASTY;  Surgeon: Earlie Server, MD;  Location: [place];  Service: Orthopedics;  Laterality: Left;   Family History:  Family History  Problem Relation Age of Onset  . Cancer Mother        breast  ca  . Hypertension Father   . CVA Father     Social History:  History  Alcohol Use No     History  Drug Use No    Social History   Social History  . Marital status: Widowed    Spouse name: N/A  . Number of children: N/A  . Years of education: N/A   Social History Main Topics  . Smoking status: Never Smoker  . Smokeless  tobacco: Never Used  . Alcohol use No  . Drug use: No  . Sexual activity: Not Asked   Other Topics Concern  . None   Social History Narrative  . None   Additional Social History:    Pain Medications: see MAR Prescriptions: see MAR Over the Counter: see MAR History of alcohol / drug use?: No history of alcohol / drug abuse  Sleep: improved   Appetite:  improved  Current Medications: Current Facility-Administered Medications  Medication Dose Route Frequency Provider Last Rate Last Dose  . acetaminophen (TYLENOL) tablet 650 mg  650 mg Oral Q6H PRN Ethelene Hal, NP   650 mg at 08/22/16 2123  . ALPRAZolam Duanne Moron) tablet 0.5 mg  0.5 mg Oral TID Brier Firebaugh, Myer Peer, MD   0.5 mg at 08/24/16 1707  . alum & mag hydroxide-simeth (MAALOX/MYLANTA) 200-200-20 MG/5ML suspension 30 mL  30 mL Oral Q4H PRN Ethelene Hal, NP   30 mL at 08/20/16 1812  . aspirin EC tablet 81 mg  81 mg Oral Daily Merian Capron, MD   81 mg at 08/24/16 0808  . atenolol (TENORMIN) tablet 50 mg  50 mg Oral QPM Ethelene Hal, NP   50 mg at 08/24/16 1707  . calcium-vitamin D (OSCAL WITH D) 500-200 MG-UNIT per tablet 1 tablet  1 tablet Oral BID Jeidy Hoerner, Myer Peer, MD   1 tablet at 08/24/16 1707  . feeding supplement (ENSURE ENLIVE) (ENSURE ENLIVE) liquid 237 mL  237 mL Oral BID BM Lindon Romp A, NP   237 mL at 08/23/16 1008  . ferrous sulfate tablet 325 mg  325 mg Oral Q breakfast Merian Capron, MD   325 mg at 08/22/16 0739  . fish oil-omega-3 fatty acids capsule 2 g  2 g Oral BID Merian Capron, MD   2 g at 08/24/16 1707  . lisinopril (PRINIVIL,ZESTRIL) tablet 20 mg  20 mg Oral q1800 Fifi Schindler A, MD   20 mg at 08/24/16 1707   And  . hydrochlorothiazide (MICROZIDE) capsule 12.5 mg  12.5 mg Oral q1800 Azya Barbero, Myer Peer, MD   12.5 mg at 08/24/16 1707  . loratadine (CLARITIN) tablet 10 mg  10 mg Oral Daily Merian Capron, MD   10 mg at 08/24/16 0808  . magnesium hydroxide (MILK OF MAGNESIA)  suspension 30 mL  30 mL Oral Daily PRN Ethelene Hal, NP   30 mL at 08/22/16 0740  . ondansetron (ZOFRAN) tablet 4 mg  4 mg Oral Q8H PRN Merian Capron, MD      . pantoprazole (PROTONIX) EC tablet 40 mg  40 mg Oral Daily Merian Capron, MD   40 mg at 08/24/16 0808  . sertraline (ZOLOFT) tablet 50 mg  50 mg Oral Daily Merian Capron, MD   50 mg at 08/24/16 0808  . simvastatin (ZOCOR) tablet 20 mg  20 mg Oral QPM Merian Capron, MD   20 mg at 08/24/16 1707  . sucralfate (CARAFATE) 1 GM/10ML suspension 1 g  1 g Oral TID WC & HS De Nurse,  Nadeem, MD   1 g at 08/24/16 1707  . traZODone (DESYREL) tablet 25 mg  25 mg Oral QHS PRN Denese Mentink, Myer Peer, MD   25 mg at 08/23/16 2102    Lab Results:  No results found for this or any previous visit (from the past 48 hour(s)).  Blood Alcohol level:  Lab Results  Component Value Date   ETH <5 50/27/7412    Metabolic Disorder Labs: No results found for: HGBA1C, MPG No results found for: PROLACTIN No results found for: CHOL, TRIG, HDL, CHOLHDL, VLDL, LDLCALC  Physical Findings: AIMS: Facial and Oral Movements Muscles of Facial Expression: None, normal Lips and Perioral Area: None, normal Jaw: None, normal Tongue: None, normal,Extremity Movements Upper (arms, wrists, hands, fingers): None, normal Lower (legs, knees, ankles, toes): None, normal, Trunk Movements Neck, shoulders, hips: None, normal, Overall Severity Severity of abnormal movements (highest score from questions above): None, normal Incapacitation due to abnormal movements: None, normal Patient's awareness of abnormal movements (rate only patient's report): No Awareness, Dental Status Current problems with teeth and/or dentures?: No Does patient usually wear dentures?: No  CIWA:    COWS:     Musculoskeletal: Strength & Muscle Tone: within normal limits Gait & Station: normal Patient leans: no lean  Psychiatric Specialty Exam: Physical Exam  Review of Systems   Cardiovascular: Negative for chest pain.  Skin: Negative for rash.  Psychiatric/Behavioral: Positive for depression. Negative for hallucinations, substance abuse and suicidal ideas. The patient is nervous/anxious and has insomnia.   All other systems reviewed and are negative. denies chest pain, no shortness of breath, no vomiting   Blood pressure (!) 149/87, pulse 87, temperature 98 F (36.7 C), temperature source Oral, resp. rate 15, height '5\' 3"'$  (1.6 m), weight 64 kg (141 lb), SpO2 99 %.Body mass index is 24.98 kg/m.  General Appearance: Well Groomed  Eye Contact:  Good  Speech:  Normal Rate  Volume:  Normal  Mood:  improving mood, less depressed   Affect:  reactive, smiles appropriately throughout session  Thought Process:  Linear and Descriptions of Associations: Intact  Orientation:  Other:  fully alert and attentive   Thought Content:  no hallucinations, no delusions, not internally preoccupied   Suicidal Thoughts:  No , denies any suicidal or self injurious ideations, denies any homicidal or violent ideations,contracts for safety on unit  Homicidal Thoughts:  No  Memory:  recent and remote grossly intact   Judgement:  Other:  improving   Insight:  improving   Psychomotor Activity:  Normal  Concentration:  Concentration: Good and Attention Span: Good  Recall:  Good  Fund of Knowledge:  Good  Language:  Good  Akathisia:  Negative  Handed:  Right  AIMS (if indicated):     Assets:  Desire for Improvement  ADL's:  Intact  Cognition:  WNL  Sleep:  Number of Hours: 6.75    Assessment - patient reports feeling better, currently minimizes depression or significant neuro-vegetative symptoms . Denies SI. Son has expressed concern about patient having been isolative, withdrawn prior to admission- patient acknowledges this , states it was related to her depression, but that she intends to be more socially active after discharge. Currently tolerating medications well - on Zoloft . Has  tolerated Xanax dose taper well - no symptoms of withdrawal and denies increasing anxiety at this time  Reports she slept better last night  Treatment Plan Summary: Treatment plan reviewed as below today 8/8   Daily contact with patient to assess  and evaluate symptoms and progress in treatment, Medication management and Plan as follows  Encourage group and milieu participation to work on coping skills and symptom reduction Continue Zoloft 50 mgrs QDAY for depression and anxiety Continue Trazodone 25 mgrs QHS PRN for insomnia as needed   Continue Xanax at 0.5 mgrs TID- hold if sedated ( for anxiety). Treatment team working on disposition planning options.     Jenne Campus, MD 08/24/2016, 6:04 PM   Patient ID: Sandy Hodges, female   DOB: Jan 27, 1945, 71 y.o.   MRN: [id number]

## 2016-08-24 NOTE — Progress Notes (Signed)
Patient ID: GAE BIHL, female   DOB: 08/24/1945, 71 y.o.   MRN: [id number]  DAR: Pt. Denies SI/HI and A/V Hallucinations. She reports sleep is good, she reports sleeping better last night. She reports her appetite is good, energy level is normal, and concentration is good. She rates depression 0/10, hopelessness 0/10, and anxiety 1/10. However, objectively patient presents with moderate anxiety. Patient does report some mild pain in her left knee but refuses intervention at this time. This afternoon patient's son, Kela Millin called Probation officer to speak about patient's current status and care. Writer was able to discuss patient's care as patient had signed a consent at time of admission. Patient's son stated that he is worried about his mother discharging before her having a different outlook on her present situation. He reports that she is negative in her outlook, non-willing to try new things and is "holing up" in her house. Writer provided support and encouragement to patient's son and allowed him time to vent his frustrations. After speaking with patient's son, Probation officer met with patient, MD Cobos, and LCSW Jeani Hawking to discuss patient's plan of care. Patient reported that she likes the structured schedule at [facility]. Writer suggested that patient continue to utilize structure after discharge in order to provide patient with some motivation in her day. Patient stated that she was open to this idea. She reports that she does not want to participate in grieving counseling after discharge. She reports, "I left it in God's hands." Scheduled medications administered to patient per physician's orders except ferrous sulfate which patient refused. She is seen in the milieu frequently interacting with her roommate and is attending groups. She reports her goal to work on today is, "coping skills." Q15 minute checks are maintained for safety.

## 2016-08-25 MED ORDER — ATENOLOL 50 MG PO TABS
50.0000 mg | ORAL_TABLET | Freq: Every evening | ORAL | 0 refills | Status: DC
Start: 2016-08-25 — End: 2016-09-08

## 2016-08-25 MED ORDER — ALPRAZOLAM 0.5 MG PO TABS: 0.5000 mg | ORAL_TABLET | Freq: Two times a day (BID) | ORAL | 0 refills | Status: DC

## 2016-08-25 MED ORDER — SERTRALINE HCL 50 MG PO TABS: 50.0000 mg | ORAL_TABLET | Freq: Every day | ORAL | 0 refills | Status: DC

## 2016-08-25 NOTE — Discharge Summary (Signed)
Physician Discharge Summary Note  Patient:  Sandy Hodges is an 71 y.o., female MRN:  [id number] DOB:  Apr 01, 1945 Patient phone:  [phone] (home)  Patient address:   [address] [place] [address],  Total Time spent with patient: 20 minutes  Date of Admission:  08/19/2016 Date of Discharge: 08/25/16   Reason for Admission: Increased depression and anxiety    Principal Problem: MDD (major depressive disorder), recurrent severe, without psychosis [facility]) Discharge Diagnoses: Patient Active Problem List   Diagnosis Date Noted  . MDD (major depressive disorder), recurrent severe, without psychosis (HCC) [F33.2] 07/14/2016  . 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 Nausea [R11.0]   . Anxiety and depression [F41.9, F32.9]   . Abdominal pain [R10.9] 07/13/2016  . Primary localized osteoarthritis of left knee [M17.12] 06/24/2016  . Primary localized osteoarthritis of right knee [M17.11] 10/16/2015  . Depression [F32.9] 10/16/2014  . Arthritis [M19.90] 11/14/2013  . Essential hypertension [I10] 11/14/2013  . Preventive measure [Z29.9] 11/14/2013  . Uterine mass [N85.9] 08/01/2013  . Deficiency anemia [D53.9] 07/09/2013  . Fatigue [R53.83] 07/09/2013  . Knee pain [M25.569] 07/09/2013  . Back pain [M54.9] 07/09/2013  . Abdominal pain, unspecified site [R10.9] 07/09/2013  . Leukopenia [D72.819] 07/09/2013  . Flank pain [R10.9] 07/09/2013  . Pancytopenia [facility]) [O13.086] 12/14/2011    Past Psychiatric History: Depression and anxiety  Past Medical History:  Past Medical History:  Diagnosis Date  . Abdominal pain, unspecified site 07/09/2013  . Anxiety   . Arthritis 11/14/2013  . Back pain 07/09/2013  . Blood dyscrasia    Pancytopenial; followed at [facility] - Dr. Bertis Ruddy  . Depression 10/16/2014  . Essential hypertension 11/14/2013  . Fatigue 07/09/2013  . Fibromyalgia   . Hyperlipidemia   . Hypertension   . Knee pain 07/09/2013  . OA (osteoarthritis) of knee    . Pancytopenia (HCC) 12/14/2011  . Pancytopenia (HCC)   . PONV (postoperative nausea and vomiting)    nausea an vomiting  . Unspecified deficiency anemia 07/09/2013    Past Surgical History:  Procedure Laterality Date  . COLONOSCOPY    . JOINT REPLACEMENT    . 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 ROTATOR CUFF REPAIR Right 09/2014  . 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 TOTAL KNEE ARTHROPLASTY Left 06/24/2016   Procedure: LEFT TOTAL KNEE ARTHROPLASTY;  Surgeon: Frederico Hamman, MD;  Location: [facility] OR;  Service: Orthopedics;  Laterality: Left;   Family History:  Family History  Problem Relation Age of Onset  . Cancer Mother        breast ca  . Hypertension Father   . CVA Father    Family Psychiatric  History: none reported Social History:  History  Alcohol Use No     History  Drug Use No    Social History   Social History  . Marital status: Widowed    Spouse name: N/A  . Number of children: N/A  . Years of education: N/A   Social History Main Topics  . Smoking status: Never Smoker  . Smokeless tobacco: Never Used  . Alcohol use No  . Drug use: No  . Sexual activity: Not Asked   Other Topics Concern  . None   Social History Narrative  . None    Hospital Course: 08/19/16:  71 y.o.femalepresented with the following recorded  history during initial assessment  " Presented  to [facility] for an assessment with a longtime friend from her church. The patient admits to daily panic attacks, inability to focus, decreased appetite, not cooking or cleaning the house. States over the last year she's experienced multiple losses and surgeries. Most recently her husband died 01-07-18sister died 06/07/2018and she had knee surgery June 2018. Since the surgery she reported increased depression and anxiety. The patient appeared to have  depressed mood, was leaning against the wall throughout the interview. The patient, unprompted, admits to having a handgun at her bedside for protection and stated "If I can't get well. I'd rather be dead." Then later states she didn't think she would harm herself, stated she had too much to live for. Denied SI, HI and A/V" Patient remained on the inpatient unit through 08/25/16. She was started on Zoloft 50 mg PO Daily and tolerated well. With group therapy and other available resources she improved greatly and plans to continue her medications and go to her follow up appointments. She denies any SI/HI/AVH and will be discharged home. She was very appropriate and cooperative at discharge.  Physical Findings: AIMS: Facial and Oral Movements Muscles of Facial Expression: None, normal Lips and Perioral Area: None, normal Jaw: None, normal Tongue: None, normal,Extremity Movements Upper (arms, wrists, hands, fingers): None, normal Lower (legs, knees, ankles, toes): None, normal, Trunk Movements Neck, shoulders, hips: None, normal, Overall Severity Severity of abnormal movements (highest score from questions above): None, normal Incapacitation due to abnormal movements: None, normal Patient's awareness of abnormal movements (rate only patient's report): No Awareness, Dental Status Current problems with teeth and/or dentures?: No Does patient usually wear dentures?: No  CIWA:    COWS:     Musculoskeletal: Strength & Muscle Tone: within normal limits Gait & Station: normal Patient leans: N/A  Psychiatric Specialty Exam: Physical Exam  Nursing note and vitals reviewed. Constitutional: She is oriented to person, place, and time. She appears well-developed and well-nourished.  Cardiovascular: Normal rate.   Respiratory: Effort normal.  Musculoskeletal: Normal range of motion.  Neurological: She is alert and oriented to person, place, and time.  Skin: Skin is warm.    Review of Systems   Constitutional: Negative.   HENT: Negative.   Eyes: Negative.   Respiratory: Negative.   Cardiovascular: Negative.   Gastrointestinal: Negative.   Genitourinary: Negative.   Musculoskeletal: Negative.   Skin: Negative.   Neurological: Negative.   Endo/Heme/Allergies: Negative.     Blood pressure (!) 149/81, pulse 67, temperature 98.9 F (37.2 C), temperature source Oral, resp. rate 16, height 5\' 3"  (1.6 m), weight 64 kg (141 lb), SpO2 99 %.Body mass index is 24.98 kg/m.  General Appearance: Casual  Eye Contact:  Good  Speech:  Clear and Coherent and Normal Rate  Volume:  Normal  Mood:  Euthymic  Affect:  Appropriate  Thought Process:  Coherent and Descriptions of Associations: Intact  Orientation:  Full (Time, Place, and Person)  Thought Content:  WDL  Suicidal Thoughts:  No  Homicidal Thoughts:  No  Memory:  Immediate;   Good Recent;   Good  Judgement:  Good  Insight:  Good  Psychomotor Activity:  Normal  Concentration:  Concentration: Good and Attention Span: Good  Recall:  Good  Fund of Knowledge:  Good  Language:  Good  Akathisia:  No  Handed:  Right  AIMS (if indicated):     Assets:  Teacher, early years/pre  ADL's:  Intact  Cognition:  WNL  Sleep:  Number of Hours: 6.25     Have you used any form of tobacco in the last 30 days? (Cigarettes, Smokeless Tobacco, Cigars, and/or Pipes): No  Has this patient used any form of tobacco in the last 30 days? (Cigarettes, Smokeless Tobacco, Cigars, and/or Pipes)  No  Blood Alcohol level:  Lab Results  Component Value Date   ETH <5 08/19/2016    Metabolic Disorder Labs:  No results found for: HGBA1C, MPG No results found for: PROLACTIN No results found for: CHOL, TRIG, HDL, CHOLHDL, VLDL, LDLCALC  See Psychiatric Specialty Exam and Suicide Risk Assessment completed by Attending Physician prior to discharge.  Discharge destination:  Home  Is patient on multiple  antipsychotic therapies at discharge:  No   Has Patient had three or more failed trials of antipsychotic monotherapy by history:  No  Recommended Plan for Multiple Antipsychotic Therapies: NA   Allergies as of 08/25/2016      Reactions   No Known Allergies       Medication List    STOP taking these medications   acetaminophen 500 MG tablet Commonly known as:  TYLENOL   clonazePAM 0.5 MG tablet Commonly known as:  KLONOPIN   DULoxetine 30 MG capsule Commonly known as:  CYMBALTA   escitalopram 5 MG tablet Commonly known as:  LEXAPRO   ondansetron 4 MG tablet Commonly known as:  ZOFRAN     TAKE these medications     Indication  ALPRAZolam 0.5 MG tablet Commonly known as:  XANAX Take 1 tablet (0.5 mg total) by mouth 2 (two) times daily. What changed:  medication strength  how much to take  when to take this  reasons to take this  additional instructions  Indication:  Feeling Anxious   aspirin EC 81 MG tablet Take 81 mg by mouth at bedtime.  Indication:  PCP indicated   atenolol 50 MG tablet Commonly known as:  TENORMIN Take 50 mg by mouth every evening. What changed:  Another medication with the same name was changed. Make sure you understand how and when to take each.  Indication:  High Blood Pressure Disorder   atenolol 50 MG tablet Commonly known as:  TENORMIN Take 1 tablet (50 mg total) by mouth every evening. What changed:  medication strength  how much to take  Indication:  High Blood Pressure Disorder   CALCIUM 600+D PLUS MINERALS PO Take 1 tablet by mouth 2 (two) times daily.  Indication:  Preventative   fenofibrate 145 MG tablet Commonly known as:  TRICOR Take 145 mg by mouth every evening.  Indication:  High Amount of Cholesterol in the Blood   ferrous sulfate 325 (65 FE) MG tablet Take 325 mg by mouth daily with breakfast. Take 2-3 times weekly due to side effects  Indication:  Iron Deficiency   fish oil-omega-3 fatty acids 1000  MG capsule Take 2 g by mouth 2 (two) times daily.  Indication:  High Amount of Cholesterol in the Blood   lisinopril-hydrochlorothiazide 20-12.5 MG tablet Commonly known as:  PRINZIDE,ZESTORETIC Take 1 tablet by mouth every evening.  Indication:  High Blood Pressure Disorder   loratadine 10 MG tablet Commonly known as:  CLARITIN Take 10 mg by mouth 2 (two) times daily.  Indication:  Hayfever   multivitamin with minerals Tabs tablet Take 1 tablet by mouth every morning. One-A-Day Women 50+  Indication:  Preventative   omeprazole 20 MG capsule Commonly known as:  PRILOSEC Take 1 capsule (20 mg total) by mouth daily.  Indication:  Gastroesophageal Reflux Disease   PROBIOTIC PO Take 1 capsule by mouth every morning.  Indication:  Preventative   sertraline 50 MG tablet Commonly known as:  ZOLOFT Take 1 tablet (50 mg total) by mouth daily.  Indication:  Major Depressive Disorder   simvastatin 20 MG tablet Commonly known as:  ZOCOR Take 20 mg by mouth every evening.  Indication:  High Amount of Fats in the Blood   sucralfate 1 GM/10ML suspension Commonly known as:  CARAFATE Take 10 mLs (1 g total) by mouth 4 (four) times daily -  with meals and at bedtime.  Indication:  Gastroesophageal Reflux Disease      Follow-up Information    BEHAVIORAL HEALTH OUTPATIENT THERAPY [place] Follow up on 09/08/2016.   Specialty:  Behavioral Health Why:  at 11:00am for medication management with Dr. Lolly Mustache. Contact information: [address] [id number] [place] [address] [phone]       [facility] Follow up on 09/06/2016.   Why:  Therapy appointment 8/21 at 12:00pm with [facility]. Please call the office at least 48 hours before your appointment to pay a 20 dollar deposit. Your appointment will be cancelled if the deposit is not made. Thank you. Contact information: [address] [place] [address] [phone]        [facility]  Follow up.   Specialty:  Behavioral Health Why:  Social worker was unable to get appointments sooner than the listed dates. If you have any mental health concerns before your scheduled outpatient appointments, please go to [facility] to be seen as a walk-in. Walk-in hours are Mon-Fri 8am-3pm. Thank you. [address] [place] [address] [phone]           Follow-up recommendations:  Continue activity as tolerated. Continue diet as recommended by your PCP. Ensure to keep all appointments with outpatient providers.  Comments:  Patient is instructed prior to discharge to: Take all medications as prescribed by his/her mental healthcare provider. Report any adverse effects and or reactions from the medicines to his/her outpatient provider promptly. Patient has been instructed & cautioned: To not engage in alcohol and or illegal drug use while on prescription medicines. In the event of worsening symptoms, patient is instructed to call the crisis hotline, 911 and or go to the nearest ED for appropriate evaluation and treatment of symptoms. To follow-up with his/her primary care provider for your other medical issues, concerns and or health care needs.    Signed: Gerlene Burdock Money, FNP 08/25/2016, 5:45 PM  Patient seen, Suicide Assessment Completed.  Disposition Plan Reviewed

## 2016-08-25 NOTE — Progress Notes (Signed)
Pt discharged home with her son. Pt was ambulatory,stable and appreciative at that time. All papers and prescriptions were given and valuables returned. Verbal understanding expressed. Denies SI/HI and A/VH. Pt given opportunity to express concerns and ask questions.  

## 2016-08-25 NOTE — Progress Notes (Signed)
  [facility] :  Will you be returning to the same living situation after discharge:  Yes,  pt returning home. At discharge, do you have transportation home?: Yes,  pt's family will transport. Do you have the ability to pay for your medications: Yes,  pt has insurance.  Release of information consent forms completed and in the chart;  Patient's signature needed at discharge.  Patient to Follow up at: Follow-up Information    BEHAVIORAL HEALTH OUTPATIENT THERAPY [place] Follow up on 09/08/2016.   Specialty:  Behavioral Health Why:  at 11:00am for medication management with Dr. Lolly MustacheArfeen. Contact information: [address] [id number] [place] [id number] [phone]       [facility] Follow up on 09/06/2016.   Why:  The[facility] appointment 8[facility]21 [facility] 12:00pm [facility]. Please call the office at least 48 hours before your appointment to pay a 20 dollar deposit. Your appointment will be cancelled if the deposit is not made. Thank you. Contact information: [address] [place] [address] [phone]        [facility] Follow up.   Specialty:  Behavioral Health Why:  Social worker was unable to get appointments sooner than the listed dates. If you have any mental health concerns before your scheduled outpatient appointments, [facility]. Walk-in hours are Mon-Fri 8am-3pm. Thank you. Contact information: [address] [place] [address] [phone]          CSW called several providers to get patient an appointment within 7 days of discharge. All providers were unable to schedule for at least 2 weeks. [facility] has any mental health concerns before her first scheduled outpatient appointment. Pt was also offered a Hospice grief counseling referral but declined.  Next level of care provider has access to [facility] Link:yes  Safety  Planning and Suicide Prevention discussed: Yes,  with pt and with pt's son.  Have you used any form of tobacco in the last 30 days? (Cigarettes, Smokeless Tobacco, Cigars, and/or Pipes): No  Has patient been referred to the Quitline?: N/A patient is not a smoker  Patient has been referred for addiction treatment: N/A  Jonathon JordanLynn B Norman Piacentini, MSW, LCSWA 08/25/2016, 1:25 PM

## 2016-08-25 NOTE — [facility]HH Group Notes (Signed)
*  Documented a day after the group was held due to Epic outage on 08/24/16 afternoon.*  [facility] LCSW Group Therapy 08/24/2016 1:15 PM  Type of Therapy: Group Therapy- Emotion Regulation  Participation Level: Active   Participation Quality:  Appropriate  Affect: Appropriate  Cognitive: Alert and Oriented   Insight:  Developing/Improving  Engagement in Therapy: Developing/Improving and Engaged   Modes of Intervention: Clarification, Confrontation, Discussion, Education, Exploration, Limit-setting, Orientation, Problem-solving, Rapport Building, Dance movement psychotherapisteality Testing, Socialization and Support  Summary of Progress/Problems: The topic for group today was emotional regulation. This group focused on both positive and negative emotion identification and allowed group members to process ways to identify feelings, regulate negative emotions, and find healthy ways to manage internal/external emotions. Group members were asked to reflect on a time when their reaction to an emotion led to a negative outcome and explored how alternative responses using emotion regulation would have benefited them. Group members were also asked to discuss a time when emotion regulation was utilized when a negative emotion was experienced. Pt identified loneliness as an emotion that she has a difficult time regulating. Pt states that since her husband died she has really struggled with finding things to fill her day and people to spend time with. Pt states that she has a lot of people in her life that want to spend time with her but she has a hard time reaching out to those people.   Donnelly StagerLynn Mohamadou Hodges, MSW, LCSWA 08/25/2016 9:04 AM

## 2016-08-25 NOTE — BHH Suicide Risk Assessment (Signed)
[facility] Discharge Suicide Risk Assessment   Principal Problem: MDD (major depressive disorder), recurrent severe, without psychosis (HCC) Discharge Diagnoses:  Patient Active Problem List   Diagnosis Date Noted  . MDD (major depressive disorder), recurrent severe, without psychosis (HCC) [F33.2] 07/14/2016  . 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 Nausea [R11.0]   . Anxiety and depression [F41.9, F32.9]   . Abdominal pain [R10.9] 07/13/2016  . Primary localized osteoarthritis of left knee [M17.12] 06/24/2016  . Primary localized osteoarthritis of right knee [M17.11] 10/16/2015  . Depression [F32.9] 10/16/2014  . Arthritis [M19.90] 11/14/2013  . Essential hypertension [I10] 11/14/2013  . Preventive measure [Z29.9] 11/14/2013  . Uterine mass [N85.9] 08/01/2013  . Deficiency anemia [D53.9] 07/09/2013  . Fatigue [R53.83] 07/09/2013  . Knee pain [M25.569] 07/09/2013  . Back pain [M54.9] 07/09/2013  . Abdominal pain, unspecified site [R10.9] 07/09/2013  . Leukopenia [D72.819] 07/09/2013  . Flank pain [R10.9] 07/09/2013  . Pancytopenia [facility]) [D61.818] 12/14/2011    Total Time spent with patient: 30 minutes  Musculoskeletal: Strength & Muscle Tone: within normal limits Gait & Station: normal Patient leans: N/A  Psychiatric Specialty Exam: ROS denies chest pain, no shortness of breath, no vomiting, no fever, no chills   Blood pressure (!) 149/81, pulse 67, temperature 98.9 F (37.2 C), temperature source Oral, resp. rate 16, height 5\' 3"  (1.6 m), weight 64 kg (141 lb), SpO2 99 %.Body mass index is 24.98 kg/m.  General Appearance: Well Groomed  Eye Contact::  Good  Speech:  Normal Rate409  Volume:  Normal  Mood:  reports feeling better, states she is not feeling depressed today  Affect:  Appropriate and more reactive, smiles at times appropriately   Thought Process:  Linear and Descriptions of Associations: Intact  Orientation:  Full (Time, Place, and Person)  Thought  Content:  denies hallucinations, no delusions, not internally preoccupied  Suicidal Thoughts:  No denies any suicidal or self injurious ideations, denies any homicidal or violent ideations   Homicidal Thoughts:  No denies any homicidal or violent ideations  Memory:  recent and remote grossly intact   Judgement:  Other:  improving   Insight:  improving   Psychomotor Activity:  Normal  Concentration:  Good  Recall:  Good  Fund of Knowledge:Good  Language: Good  Akathisia:  Negative  Handed:  Right  AIMS (if indicated):     Assets:  Communication Skills Desire for Improvement Resilience  Sleep:  Number of Hours: 6.25  Cognition: WNL  ADL's:  Intact   Mental Status Per Nursing Assessment::   On Admission:  NA  Demographic Factors:  71 year old female, widowed, lives alone, has 2 adult children   Loss Factors: Widowed, recent elective knee surgery, limited daily structure   Historical Factors: No prior psychiatric admissions, no history of suicide attempts  Risk Reduction Factors:   Sense of responsibility to family and Positive coping skills or problem solving skills  Continued Clinical Symptoms:  Alert and attentive, well related, well groomed,reports feeling better and currently minimizes depression, affect is appropriate and fuller in range, smiles often and appropriately, no thought disorder, no suicidal or self injurious ideations, no homicidal or violent ideations, no hallucinations, no delusions, future oriented. Denies medication side effects. Behavior on unit calm and in good control, pleasant on approach.  Patient states she plans to be less isolative after discharge and states she plans to return to church activities and to go out with friends more often. Of note, patient has slightly low Hgb,  WBC, and platelet count- she states she is aware of this , and was worked up for same by PCP without any findings  Cognitive Features That Contribute To Risk:  No gross  cognitive deficits noted upon discharge. Is alert , attentive, and oriented x 3   Suicide Risk:  Mild:  Suicidal ideation of limited frequency, intensity, duration, and specificity.  There are no identifiable plans, no associated intent, mild dysphoria and related symptoms, good self-control (both objective and subjective assessment), few other risk factors, and identifiable protective factors, including available and accessible social support.  Follow-up Information    BEHAVIORAL HEALTH OUTPATIENT THERAPY [place] Follow up on 09/08/2016.   Specialty:  Behavioral Health Why:  at 11:00am for medication management with Dr. Lolly MustacheArfeen. Contact information: [address] [id number] [place] [id number] [phone]       [facility] Follow up on 09/06/2016.   Why:  The[facility] 8[facility]21 [facility] 12:00pm [facility]. Please call the office at least 48 hours before your appointment to pay a 20 dollar deposit. Your appointment will be cancelled if the deposit is not made. Thank you. Contact information: [address] [place] [id number] [phone]        Gracy BruinsBall, Nancy C, LPC Follow up.   Specialty:  Psychology Why:  Social worker has made a referral on your behalf. Awaiting call back for time and date of your appointment. Contact information: [address] [place] [id number] [phone]           Plan Of Care/Follow-up recommendations:  Activity:  as tolerated  Diet:  regular Tests:  NA Other:  see below  Patient is expressing readiness for discharge and is leaving unit in good spirits  Patient 's son is picking her up later today Plans to return home Follow up as above  She has an established PCP, Dr. Laurine [facility]azerWalters [facility] for management of medical issues as needed  Craige CottaFernando A Cobos, MD 08/25/2016, 12:59 PM

## 2016-09-08 ENCOUNTER — Ambulatory Visit (INDEPENDENT_AMBULATORY_CARE_PROVIDER_SITE_OTHER): Payer: Medicare Other | Admitting: Psychiatry

## 2016-09-08 ENCOUNTER — Encounter (HOSPITAL_COMMUNITY): Payer: Self-pay | Admitting: Psychiatry

## 2016-09-08 VITALS — BP 122/68 | HR 67 | Ht 65.0 in | Wt 140.0 lb

## 2016-09-08 DIAGNOSIS — F331 Major depressive disorder, recurrent, moderate: Secondary | ICD-10-CM | POA: Diagnosis not present

## 2016-09-08 DIAGNOSIS — R45851 Suicidal ideations: Secondary | ICD-10-CM | POA: Diagnosis not present

## 2016-09-08 DIAGNOSIS — Z9889 Other specified postprocedural states: Secondary | ICD-10-CM

## 2016-09-08 DIAGNOSIS — Z818 Family history of other mental and behavioral disorders: Secondary | ICD-10-CM

## 2016-09-08 DIAGNOSIS — Z560 Unemployment, unspecified: Secondary | ICD-10-CM

## 2016-09-08 DIAGNOSIS — F419 Anxiety disorder, unspecified: Secondary | ICD-10-CM

## 2016-09-08 DIAGNOSIS — Z634 Disappearance and death of family member: Secondary | ICD-10-CM | POA: Diagnosis not present

## 2016-09-08 MED ORDER — SERTRALINE HCL 100 MG PO TABS: 100.0000 mg | ORAL_TABLET | Freq: Every day | ORAL | 0 refills | Status: DC

## 2016-09-08 MED ORDER — LORAZEPAM 1 MG PO TABS: ORAL_TABLET | ORAL | 0 refills | Status: DC

## 2016-09-08 NOTE — Progress Notes (Signed)
Psychiatric Initial Adult Assessment   Patient Identification: Sandy Hodges MRN:  [id number] Date of Evaluation:  09/08/2016 Referral Source: [facility] Chief Complaint:   I still feel anxious and nervous.  Visit Diagnosis:    ICD-10-CM   1. MDD (major depressive disorder), recurrent episode, moderate (HCC) F33.1 sertraline (ZOLOFT) 100 MG tablet    LORazepam (ATIVAN) 1 MG tablet    History of Present Illness:  Sandy Hodges is 71 year old Caucasian unemployed widowed female who is referred from inpatient psychotic services.  Patient was admitted to behavioral [place] due to severe depression and anxiety symptoms.  She has multiple losses in 8 months.  Her husband died in 2015/12/27, sister died May 26, 2016, 3 other family member died in past 6 months and she also had knee surgery in June 2018.  She was complaining of daily panic attack, decreased appetite, weight loss, not able to do regular activities, crying spells, poor sleep, anhedonia, feeling of hopelessness and worthlessness.  She also started to have suicidal thoughts and she does not care about her life.  Though she denies any plan but also mentioned that she does not care if she died.  She was brought into the emergency room by her friend from the church.  In the hospital she was given initially Lexapro and Cymbalta but it was switched to Zoloft.  Currently she is taking Zoloft 50 mg daily.  She used to take Xanax 1 mg twice a day prescribed by her primary care physician but it was discontinued in the hospital and tried Klonopin.  However she did not see any improvement with Klonopin and then Xanax was restarted but cut down to 0.5 mg twice a day.  Patient is still feel very anxious, nervous, tearful and poor sleep.  She does not leave her house.  She used to enjoy spending time with grandkids, listing music but lately she has not done these things.  Her older son came with her today was very supportive and cooperative.  She  believe that cutting down Xanax is not helping her anxiety.  She also complaining of knee pain but it is getting better.  She endorse lately her level of function has been down.  She is not active, social and feels burden to her family.  She feel lonely and withdrawn.  She does not believe her current medicine is working.  She also endorse nausea with Zoloft.  Patient denies drinking alcohol or using any illegal substances.  She lives by herself but her older son is very supportive and cooperative.  She has another younger son who also comes every day to see her but sometimes she is not happy with him because he asked her to go outside and do exercise and gets upset when she does not do it.  Patient denies drinking alcohol or using any illegal substances.  She's been married with her husband for more than 47 years .  Her husband has a cancer and slowly and gradually his health has been declining until he died 2015-12-27.  Patient has no tremors, shakes or any EPS.  She denies any nightmares, flashback, paranoia, hallucination or any suicidal thoughts or homicidal thought.  She denies any phobias or any OCD symptoms.    Associated Signs/Symptoms: Depression Symptoms:  depressed mood, anhedonia, insomnia, fatigue, difficulty concentrating, hopelessness, recurrent thoughts of death, anxiety, panic attacks, loss of energy/fatigue, disturbed sleep, weight loss, (Hypo) Manic Symptoms:  Irritable Mood, Anxiety Symptoms:  No psychotic symptoms Psychotic Symptoms:  No psychotic symptoms PTSD Symptoms: NA  Past Psychiatric History:  Patient denies any history of suicidal attempt.  She has been taking Xanax prescribed by her primary care physician when her husband diagnosed with cancer.  She was admitted to behavioral [facility] in August 2018 due to severe depression and having suicidal thoughts.  In the hospital she was given Lexapro, Cymbalta and Klonopin but it was discontinued and given Zoloft.   Patient denies any history of psychosis, paranoia or any hallucination.  Previous Psychotropic Medications: Yes   Substance Abuse History in the last 12 months:  No.  Consequences of Substance Abuse: NA  Past Medical History:  Past Medical History:  Diagnosis Date  . Abdominal pain, unspecified site 07/09/2013  . Anxiety   . Arthritis 11/14/2013  . Back pain 07/09/2013  . Blood dyscrasia    Pancytopenial; followed at [facility] - Dr. Alvy Bimler  . Depression 10/16/2014  . Essential hypertension 11/14/2013  . Fatigue 07/09/2013  . Fibromyalgia   . Hyperlipidemia   . Hypertension   . Knee pain 07/09/2013  . OA (osteoarthritis) of knee   . Pancytopenia ([place]) 12/14/2011  . Pancytopenia ([place])   . PONV (postoperative nausea and vomiting)    nausea an vomiting  . Unspecified deficiency anemia 07/09/2013    Past Surgical History:  Procedure Laterality Date  . COLONOSCOPY    . JOINT REPLACEMENT    . 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 ROTATOR CUFF REPAIR Right 09/2014  . TOTAL KNEE ARTHROPLASTY Right 10/16/2015   Procedure: TOTAL KNEE ARTHROPLASTY;  Surgeon: Earlie Server, MD;  Location: [place];  Service: Orthopedics;  Laterality: Right;  . TOTAL KNEE ARTHROPLASTY Left 06/24/2016   Procedure: LEFT TOTAL KNEE ARTHROPLASTY;  Surgeon: Earlie Server, MD;  Location: [place];  Service: Orthopedics;  Laterality: Left;    Family Psychiatric History: Patient reported her parents, sister and other family member has anxiety illness.  Family History:  Family History  Problem Relation Age of Onset  . Cancer Mother        breast ca  . Hypertension Father   . CVA Father     Social History:   Social History   Social History  . Marital status: Widowed    Spouse name: N/A  . Number of children: N/A  . Years of education: N/A   Social History Main Topics  . Smoking status: Never Smoker  . Smokeless tobacco: Never Used  . Alcohol use No  . Drug use: No  . Sexual activity:  Not Asked   Other Topics Concern  . None   Social History Narrative  . None    Additional Social History: Patient married twice.  Her first marriage ended when her husband left her while she was pregnant.  However her second husband adopted her first child.  They're married for more than 1 years until husband died 12/30/2015 due to cancer.  Patient has 2 sons.  She has 6 grandchildren.  She lives by herself.  Allergies:   Allergies  Allergen Reactions  . No Known Allergies     Metabolic Disorder Labs: Recent Results (from the past 2160 hour(s))  Type and screen Order type and screen if day of surgery is less than 15 days from draw of preadmission visit or order morning of surgery if day of surgery is greater than 6 days from preadmission visit.     Status: None   Collection Time: 06/14/16 12:10 PM  Result Value Ref Range   ABO/RH(D) A POS    Antibody Screen NEG    Sample Expiration 06/28/2016    Extend sample reason NO TRANSFUSIONS OR PREGNANCY IN THE PAST 3 MONTHS   Urinalysis, Routine w reflex microscopic     Status: None   Collection Time: 06/14/16  1:11 PM  Result Value Ref Range   Color, Urine YELLOW YELLOW   APPearance CLEAR CLEAR   Specific Gravity, Urine 1.016 1.005 - 1.030   pH 5.0 5.0 - 8.0   Glucose, UA NEGATIVE NEGATIVE mg/dL   Hgb urine dipstick NEGATIVE NEGATIVE   Bilirubin Urine NEGATIVE NEGATIVE   Ketones, ur NEGATIVE NEGATIVE mg/dL   Protein, ur NEGATIVE NEGATIVE mg/dL   Nitrite NEGATIVE NEGATIVE   Leukocytes, UA NEGATIVE NEGATIVE  Urine culture     Status: Abnormal   Collection Time: 06/14/16  1:11 PM  Result Value Ref Range   Specimen Description URINE, CLEAN CATCH    Special Requests NONE    Culture <10,000 COLONIES/mL INSIGNIFICANT GROWTH (A)    Report Status 06/15/2016 FINAL   Surgical pcr screen     Status: None   Collection Time: 06/14/16  1:11 PM  Result Value Ref Range   MRSA, PCR NEGATIVE NEGATIVE   Staphylococcus aureus NEGATIVE  NEGATIVE    Comment:        The Xpert SA Assay (FDA approved for NASAL specimens in patients over 43 years of age), is one component of a comprehensive surveillance program.  Test performance has been validated by [facility] for patients greater than or equal to 75 year old. It is not intended to diagnose infection nor to guide or monitor treatment.   APTT     Status: None   Collection Time: 06/14/16  1:14 PM  Result Value Ref Range   aPTT 29 24 - 36 seconds  CBC WITH DIFFERENTIAL     Status: Abnormal   Collection Time: 06/14/16  1:14 PM  Result Value Ref Range   WBC 2.5 (L) 4.0 - 10.5 K/uL   RBC 3.70 (L) 3.87 - 5.11 MIL/uL   Hemoglobin 11.1 (L) 12.0 - 15.0 g/dL   HCT 35.3 (L) 36.0 - 46.0 %   MCV 95.4 78.0 - 100.0 fL   MCH 30.0 26.0 - 34.0 pg   MCHC 31.4 30.0 - 36.0 g/dL   RDW 12.7 11.5 - 15.5 %   Platelets 133 (L) 150 - 400 K/uL   Neutrophils Relative % 63 %   Neutro Abs 1.6 (L) 1.7 - 7.7 K/uL   Lymphocytes Relative 22 %   Lymphs Abs 0.6 (L) 0.7 - 4.0 K/uL   Monocytes Relative 6 %   Monocytes Absolute 0.2 0.1 - 1.0 K/uL   Eosinophils Relative 8 %   Eosinophils Absolute 0.2 0.0 - 0.7 K/uL   Basophils Relative 1 %   Basophils Absolute 0.0 0.0 - 0.1 K/uL  Comprehensive metabolic panel     Status: Abnormal   Collection Time: 06/14/16  1:14 PM  Result Value Ref Range   Sodium 141 135 - 145 mmol/L   Potassium 3.8 3.5 - 5.1 mmol/L   Chloride 106 101 - 111 mmol/L   CO2 27 22 - 32 mmol/L   Glucose, Bld 88 65 - 99 mg/dL   BUN 33 (H) 6 - 20 mg/dL   Creatinine, Ser 1.16 (H) 0.44 - 1.00 mg/dL   Calcium 10.5 (H) 8.9 - 10.3 mg/dL   Total Protein 6.6 6.5 - 8.1 g/dL  Albumin 3.9 3.5 - 5.0 g/dL   AST 24 15 - 41 U/L   ALT 18 14 - 54 U/L   Alkaline Phosphatase 28 (L) 38 - 126 U/L   Total Bilirubin 0.5 0.3 - 1.2 mg/dL   GFR calc non Af Amer 46 (L) >60 mL/min   GFR calc Af Amer 53 (L) >60 mL/min    Comment: (NOTE) The eGFR has been calculated using the CKD EPI equation. This  calculation has not been validated in all clinical situations. eGFR's persistently <60 mL/min signify possible Chronic Kidney Disease.    Anion gap 8 5 - 15  Protime-INR     Status: None   Collection Time: 06/14/16  1:14 PM  Result Value Ref Range   Prothrombin Time 12.9 11.4 - 15.2 seconds   INR 0.97   CBC     Status: Abnormal   Collection Time: 06/25/16  4:42 AM  Result Value Ref Range   WBC 8.7 4.0 - 10.5 K/uL   RBC 3.38 (L) 3.87 - 5.11 MIL/uL   Hemoglobin 10.1 (L) 12.0 - 15.0 g/dL   HCT 31.8 (L) 36.0 - 46.0 %   MCV 94.1 78.0 - 100.0 fL   MCH 29.9 26.0 - 34.0 pg   MCHC 31.8 30.0 - 36.0 g/dL   RDW 13.0 11.5 - 15.5 %   Platelets 142 (L) 150 - 400 K/uL  Basic metabolic panel     Status: Abnormal   Collection Time: 06/25/16  4:42 AM  Result Value Ref Range   Sodium 139 135 - 145 mmol/L   Potassium 4.2 3.5 - 5.1 mmol/L   Chloride 103 101 - 111 mmol/L   CO2 26 22 - 32 mmol/L   Glucose, Bld 119 (H) 65 - 99 mg/dL   BUN 20 6 - 20 mg/dL   Creatinine, Ser 1.06 (H) 0.44 - 1.00 mg/dL   Calcium 9.6 8.9 - 10.3 mg/dL   GFR calc non Af Amer 51 (L) >60 mL/min   GFR calc Af Amer 59 (L) >60 mL/min    Comment: (NOTE) The eGFR has been calculated using the CKD EPI equation. This calculation has not been validated in all clinical situations. eGFR's persistently <60 mL/min signify possible Chronic Kidney Disease.    Anion gap 10 5 - 15  Lipase, blood     Status: None   Collection Time: 07/10/16  3:38 PM  Result Value Ref Range   Lipase 43 11 - 51 U/L  Comprehensive metabolic panel     Status: Abnormal   Collection Time: 07/10/16  3:38 PM  Result Value Ref Range   Sodium 135 135 - 145 mmol/L   Potassium 4.5 3.5 - 5.1 mmol/L   Chloride 102 101 - 111 mmol/L   CO2 25 22 - 32 mmol/L   Glucose, Bld 113 (H) 65 - 99 mg/dL   BUN 22 (H) 6 - 20 mg/dL   Creatinine, Ser 1.09 (H) 0.44 - 1.00 mg/dL   Calcium 10.0 8.9 - 10.3 mg/dL   Total Protein 6.8 6.5 - 8.1 g/dL   Albumin 4.1 3.5 - 5.0 g/dL    AST 19 15 - 41 U/L   ALT 13 (L) 14 - 54 U/L   Alkaline Phosphatase 31 (L) 38 - 126 U/L   Total Bilirubin 0.6 0.3 - 1.2 mg/dL   GFR calc non Af Amer 49 (L) >60 mL/min   GFR calc Af Amer 57 (L) >60 mL/min    Comment: (NOTE) The eGFR has been calculated  using the CKD EPI equation. This calculation has not been validated in all clinical situations. eGFR's persistently <60 mL/min signify possible Chronic Kidney Disease.    Anion gap 8 5 - 15  CBC     Status: Abnormal   Collection Time: 07/10/16  3:38 PM  Result Value Ref Range   WBC 3.3 (L) 4.0 - 10.5 K/uL   RBC 3.42 (L) 3.87 - 5.11 MIL/uL   Hemoglobin 10.6 (L) 12.0 - 15.0 g/dL   HCT 32.6 (L) 36.0 - 46.0 %   MCV 95.3 78.0 - 100.0 fL   MCH 31.0 26.0 - 34.0 pg   MCHC 32.5 30.0 - 36.0 g/dL   RDW 14.3 11.5 - 15.5 %   Platelets 234 150 - 400 K/uL  Urinalysis, Routine w reflex microscopic     Status: None   Collection Time: 07/10/16  3:49 PM  Result Value Ref Range   Color, Urine YELLOW YELLOW   APPearance CLEAR CLEAR   Specific Gravity, Urine 1.012 1.005 - 1.030   pH 5.0 5.0 - 8.0   Glucose, UA NEGATIVE NEGATIVE mg/dL   Hgb urine dipstick NEGATIVE NEGATIVE   Bilirubin Urine NEGATIVE NEGATIVE   Ketones, ur NEGATIVE NEGATIVE mg/dL   Protein, ur NEGATIVE NEGATIVE mg/dL   Nitrite NEGATIVE NEGATIVE   Leukocytes, UA NEGATIVE NEGATIVE  I-Stat Troponin, ED (not at [facility])     Status: None   Collection Time: 07/10/16  4:48 PM  Result Value Ref Range   Troponin i, poc 0.01 0.00 - 0.08 ng/mL   Comment 3            Comment: Due to the release kinetics of cTnI, a negative result within the first hours of the onset of symptoms does not rule out myocardial infarction with certainty. If myocardial infarction is still suspected, repeat the test at appropriate intervals.   Lipase, blood     Status: Abnormal   Collection Time: 07/12/16  8:08 PM  Result Value Ref Range   Lipase 57 (H) 11 - 51 U/L  Comprehensive metabolic panel     Status:  Abnormal   Collection Time: 07/12/16  8:08 PM  Result Value Ref Range   Sodium 132 (L) 135 - 145 mmol/L   Potassium 4.2 3.5 - 5.1 mmol/L   Chloride 99 (L) 101 - 111 mmol/L   CO2 24 22 - 32 mmol/L   Glucose, Bld 107 (H) 65 - 99 mg/dL   BUN 15 6 - 20 mg/dL   Creatinine, Ser 0.90 0.44 - 1.00 mg/dL   Calcium 9.8 8.9 - 10.3 mg/dL   Total Protein 6.7 6.5 - 8.1 g/dL   Albumin 4.0 3.5 - 5.0 g/dL   AST 19 15 - 41 U/L   ALT 13 (L) 14 - 54 U/L   Alkaline Phosphatase 33 (L) 38 - 126 U/L   Total Bilirubin 0.7 0.3 - 1.2 mg/dL   GFR calc non Af Amer >60 >60 mL/min   GFR calc Af Amer >60 >60 mL/min    Comment: (NOTE) The eGFR has been calculated using the CKD EPI equation. This calculation has not been validated in all clinical situations. eGFR's persistently <60 mL/min signify possible Chronic Kidney Disease.    Anion gap 9 5 - 15  CBC     Status: Abnormal   Collection Time: 07/12/16  8:08 PM  Result Value Ref Range   WBC 3.0 (L) 4.0 - 10.5 K/uL   RBC 3.42 (L) 3.87 - 5.11 MIL/uL   Hemoglobin  10.6 (L) 12.0 - 15.0 g/dL   HCT 32.2 (L) 36.0 - 46.0 %   MCV 94.2 78.0 - 100.0 fL   MCH 31.0 26.0 - 34.0 pg   MCHC 32.9 30.0 - 36.0 g/dL   RDW 14.0 11.5 - 15.5 %   Platelets 233 150 - 400 K/uL  Urinalysis, Routine w reflex microscopic     Status: Abnormal   Collection Time: 07/13/16  5:59 AM  Result Value Ref Range   Color, Urine STRAW (A) YELLOW   APPearance CLEAR CLEAR   Specific Gravity, Urine 1.004 (L) 1.005 - 1.030   pH 8.0 5.0 - 8.0   Glucose, UA NEGATIVE NEGATIVE mg/dL   Hgb urine dipstick NEGATIVE NEGATIVE   Bilirubin Urine NEGATIVE NEGATIVE   Ketones, ur NEGATIVE NEGATIVE mg/dL   Protein, ur NEGATIVE NEGATIVE mg/dL   Nitrite NEGATIVE NEGATIVE   Leukocytes, UA TRACE (A) NEGATIVE   RBC / HPF 0-5 0 - 5 RBC/hpf   WBC, UA 0-5 0 - 5 WBC/hpf   Bacteria, UA RARE (A) NONE SEEN   Squamous Epithelial / LPF NONE SEEN NONE SEEN  I-Stat Troponin, ED (not at [facility])     Status: None   Collection  Time: 07/13/16  8:09 AM  Result Value Ref Range   Troponin i, poc 0.00 0.00 - 0.08 ng/mL   Comment 3            Comment: Due to the release kinetics of cTnI, a negative result within the first hours of the onset of symptoms does not rule out myocardial infarction with certainty. If myocardial infarction is still suspected, repeat the test at appropriate intervals.   CBC     Status: Abnormal   Collection Time: 07/13/16 10:22 AM  Result Value Ref Range   WBC 2.5 (L) 4.0 - 10.5 K/uL   RBC 3.54 (L) 3.87 - 5.11 MIL/uL   Hemoglobin 10.8 (L) 12.0 - 15.0 g/dL   HCT 33.2 (L) 36.0 - 46.0 %   MCV 93.8 78.0 - 100.0 fL   MCH 30.5 26.0 - 34.0 pg   MCHC 32.5 30.0 - 36.0 g/dL   RDW 14.3 11.5 - 15.5 %   Platelets 211 150 - 400 K/uL  Creatinine, serum     Status: None   Collection Time: 07/13/16 10:22 AM  Result Value Ref Range   Creatinine, Ser 0.82 0.44 - 1.00 mg/dL   GFR calc non Af Amer >60 >60 mL/min   GFR calc Af Amer >60 >60 mL/min    Comment: (NOTE) The eGFR has been calculated using the CKD EPI equation. This calculation has not been validated in all clinical situations. eGFR's persistently <60 mL/min signify possible Chronic Kidney Disease.   Troponin I     Status: None   Collection Time: 07/13/16 10:22 AM  Result Value Ref Range   Troponin I <0.03 <0.03 ng/mL  Troponin I     Status: None   Collection Time: 07/13/16  2:31 PM  Result Value Ref Range   Troponin I <0.03 <0.03 ng/mL  Troponin I     Status: None   Collection Time: 07/13/16  8:57 PM  Result Value Ref Range   Troponin I <0.03 <0.03 ng/mL  Comprehensive metabolic panel     Status: Abnormal   Collection Time: 07/14/16  5:24 AM  Result Value Ref Range   Sodium 138 135 - 145 mmol/L   Potassium 4.0 3.5 - 5.1 mmol/L   Chloride 104 101 - 111 mmol/L  CO2 24 22 - 32 mmol/L   Glucose, Bld 93 65 - 99 mg/dL   BUN 20 6 - 20 mg/dL   Creatinine, Ser 0.99 0.44 - 1.00 mg/dL   Calcium 9.7 8.9 - 10.3 mg/dL   Total Protein  6.1 (L) 6.5 - 8.1 g/dL   Albumin 3.5 3.5 - 5.0 g/dL   AST 19 15 - 41 U/L   ALT 11 (L) 14 - 54 U/L   Alkaline Phosphatase 31 (L) 38 - 126 U/L   Total Bilirubin 0.6 0.3 - 1.2 mg/dL   GFR calc non Af Amer 56 (L) >60 mL/min   GFR calc Af Amer >60 >60 mL/min    Comment: (NOTE) The eGFR has been calculated using the CKD EPI equation. This calculation has not been validated in all clinical situations. eGFR's persistently <60 mL/min signify possible Chronic Kidney Disease.    Anion gap 10 5 - 15  CBC with Differential/Platelet     Status: Abnormal   Collection Time: 07/15/16 10:16 AM  Result Value Ref Range   WBC 3.3 (L) 4.0 - 10.5 K/uL   RBC 3.50 (L) 3.87 - 5.11 MIL/uL   Hemoglobin 10.9 (L) 12.0 - 15.0 g/dL   HCT 33.3 (L) 36.0 - 46.0 %   MCV 95.1 78.0 - 100.0 fL   MCH 31.1 26.0 - 34.0 pg   MCHC 32.7 30.0 - 36.0 g/dL   RDW 14.5 11.5 - 15.5 %   Platelets 187 150 - 400 K/uL   Neutrophils Relative % 66 %   Neutro Abs 2.2 1.7 - 7.7 K/uL   Lymphocytes Relative 18 %   Lymphs Abs 0.6 (L) 0.7 - 4.0 K/uL   Monocytes Relative 12 %   Monocytes Absolute 0.4 0.1 - 1.0 K/uL   Eosinophils Relative 4 %   Eosinophils Absolute 0.1 0.0 - 0.7 K/uL   Basophils Relative 0 %   Basophils Absolute 0.0 0.0 - 0.1 K/uL  Urine rapid drug screen (hosp performed)     Status: Abnormal   Collection Time: 08/19/16  5:03 PM  Result Value Ref Range   Opiates NONE DETECTED NONE DETECTED   Cocaine NONE DETECTED NONE DETECTED   Benzodiazepines POSITIVE (A) NONE DETECTED   Amphetamines NONE DETECTED NONE DETECTED   Tetrahydrocannabinol NONE DETECTED NONE DETECTED   Barbiturates NONE DETECTED NONE DETECTED    Comment:        DRUG SCREEN FOR MEDICAL PURPOSES ONLY.  IF CONFIRMATION IS NEEDED FOR ANY PURPOSE, NOTIFY LAB WITHIN 5 DAYS.        LOWEST DETECTABLE LIMITS FOR URINE DRUG SCREEN Drug Class       Cutoff (ng/mL) Amphetamine      1000 Barbiturate      200 Benzodiazepine   962 Tricyclics        952 Opiates          300 Cocaine          300 THC              50   Urinalysis, Routine w reflex microscopic     Status: Abnormal   Collection Time: 08/19/16  5:03 PM  Result Value Ref Range   Color, Urine YELLOW YELLOW   APPearance CLEAR CLEAR   Specific Gravity, Urine 1.009 1.005 - 1.030   pH 6.0 5.0 - 8.0   Glucose, UA NEGATIVE NEGATIVE mg/dL   Hgb urine dipstick NEGATIVE NEGATIVE   Bilirubin Urine NEGATIVE NEGATIVE   Ketones, ur NEGATIVE NEGATIVE mg/dL  Protein, ur NEGATIVE NEGATIVE mg/dL   Nitrite NEGATIVE NEGATIVE   Leukocytes, UA MODERATE (A) NEGATIVE   RBC / HPF 0-5 0 - 5 RBC/hpf   WBC, UA 0-5 0 - 5 WBC/hpf   Bacteria, UA NONE SEEN NONE SEEN   Squamous Epithelial / LPF 0-5 (A) NONE SEEN   Mucus PRESENT   Comprehensive metabolic panel     Status: Abnormal   Collection Time: 08/19/16  5:16 PM  Result Value Ref Range   Sodium 135 135 - 145 mmol/L   Potassium 3.7 3.5 - 5.1 mmol/L   Chloride 100 (L) 101 - 111 mmol/L   CO2 26 22 - 32 mmol/L   Glucose, Bld 112 (H) 65 - 99 mg/dL   BUN 16 6 - 20 mg/dL   Creatinine, Ser 0.87 0.44 - 1.00 mg/dL   Calcium 10.3 8.9 - 10.3 mg/dL   Total Protein 7.5 6.5 - 8.1 g/dL   Albumin 4.3 3.5 - 5.0 g/dL   AST 21 15 - 41 U/L   ALT 14 14 - 54 U/L   Alkaline Phosphatase 28 (L) 38 - 126 U/L   Total Bilirubin 0.5 0.3 - 1.2 mg/dL   GFR calc non Af Amer >60 >60 mL/min   GFR calc Af Amer >60 >60 mL/min    Comment: (NOTE) The eGFR has been calculated using the CKD EPI equation. This calculation has not been validated in all clinical situations. eGFR's persistently <60 mL/min signify possible Chronic Kidney Disease.    Anion gap 9 5 - 15  Ethanol     Status: None   Collection Time: 08/19/16  5:16 PM  Result Value Ref Range   Alcohol, Ethyl (B) <5 <5 mg/dL    Comment:        LOWEST DETECTABLE LIMIT FOR SERUM ALCOHOL IS 5 mg/dL FOR MEDICAL PURPOSES ONLY   CBC with Diff     Status: Abnormal   Collection Time: 08/19/16  5:16 PM  Result  Value Ref Range   WBC 3.5 (L) 4.0 - 10.5 K/uL   RBC 3.95 3.87 - 5.11 MIL/uL   Hemoglobin 11.9 (L) 12.0 - 15.0 g/dL   HCT 36.0 36.0 - 46.0 %   MCV 91.1 78.0 - 100.0 fL   MCH 30.1 26.0 - 34.0 pg   MCHC 33.1 30.0 - 36.0 g/dL   RDW 12.3 11.5 - 15.5 %   Platelets 130 (L) 150 - 400 K/uL   Neutrophils Relative % 67 %   Neutro Abs 2.3 1.7 - 7.7 K/uL   Lymphocytes Relative 25 %   Lymphs Abs 0.9 0.7 - 4.0 K/uL   Monocytes Relative 6 %   Monocytes Absolute 0.2 0.1 - 1.0 K/uL   Eosinophils Relative 1 %   Eosinophils Absolute 0.1 0.0 - 0.7 K/uL   Basophils Relative 1 %   Basophils Absolute 0.0 0.0 - 0.1 K/uL   No results found for: HGBA1C, MPG No results found for: PROLACTIN No results found for: CHOL, TRIG, HDL, CHOLHDL, VLDL, LDLCALC   Current Medications: Current Outpatient Prescriptions  Medication Sig Dispense Refill  . ALPRAZolam (XANAX) 0.5 MG tablet Take 1 tablet (0.5 mg total) by mouth 2 (two) times daily. 60 tablet 0  . aspirin EC 81 MG tablet Take 81 mg by mouth at bedtime.     Marland Kitchen atenolol (TENORMIN) 50 MG tablet Take 50 mg by mouth every evening.   0  . Calcium Carbonate-Vit D-Min (CALCIUM 600+D PLUS MINERALS PO) Take 1 tablet by mouth 2 (  two) times daily.    . fenofibrate (TRICOR) 145 MG tablet Take 145 mg by mouth every evening.     Marland Kitchen lisinopril-hydrochlorothiazide (PRINZIDE,ZESTORETIC) 20-12.5 MG per tablet Take 1 tablet by mouth every evening.   0  . loratadine (CLARITIN) 10 MG tablet Take 10 mg by mouth 2 (two) times daily.     . Multiple Vitamin (MULTIVITAMIN WITH MINERALS) TABS tablet Take 1 tablet by mouth every morning. One-A-Day Women 50+    . sertraline (ZOLOFT) 50 MG tablet Take 1 tablet (50 mg total) by mouth daily. 30 tablet 0  . sucralfate (CARAFATE) 1 GM/10ML suspension Take 10 mLs (1 g total) by mouth 4 (four) times daily -  with meals and at bedtime. 420 mL 0   No current facility-administered medications for this visit.     Neurologic: Headache:  No Seizure: No Paresthesias:No  Musculoskeletal: Strength & Muscle Tone: within normal limits Gait & Station: normal Patient leans: N/A  Psychiatric Specialty Exam: ROS  Blood pressure 122/68, pulse 67, height '5\' 5"'$  (1.651 m), weight 140 lb (63.5 kg).Body mass index is 23.3 kg/m.  General Appearance: Casual and Tearful  Eye Contact:  Fair  Speech:  Slow  Volume:  Decreased  Mood:  Anxious and Depressed  Affect:  Constricted and Depressed  Thought Process:  Goal Directed  Orientation:  Full (Time, Place, and Person)  Thought Content:  Rumination  Suicidal Thoughts:  No  Homicidal Thoughts:  No  Memory:  Immediate;   Fair Recent;   Fair Remote;   Fair  Judgement:  Good  Insight:  Fair  Psychomotor Activity:  Decreased  Concentration:  Concentration: Fair and Attention Span: Fair  Recall:  [employer] of Knowledge:Good  Language: Good  Akathisia:  No  Handed:  Right  AIMS (if indicated):  0  Assets:  Communication Skills Desire for Improvement Housing Resilience Social Support  ADL's:  Intact  Cognition: WNL  Sleep:  Fair   Assessment: Major depressive disorder, recurrent.  Anxiety disorder NOS.  Plan: Reassurance given.  Patient is taking Zoloft 50 mg since discharge from the hospital.  In the past briefly she has taken Lexapro and Cymbalta but unclear why it was discontinued.  She do not recall any side effects.  She was taking Xanax 1 mg twice a day but switched to Klonopin that did not work and then cut down the Xanax 0.5 mg twice a day.  I do believe patient having withdrawal symptoms from benzodiazepine.  She still going through grief due to multiple losses.  Recommended to increase Zoloft 100 mg and try Ativan 1 mg half to one tablet twice a day to help anxiety due to longer half-time.  Recommended to discontinue Xanax.  I also believe she should see a therapist for coping skills.  We will schedule appointment with a therapist in this office for CBT.  Patient has  mild nausea with the Zoloft however recommended that GI side effects he was subsided in few days.  I also reviewed blood work results and collateral information including discharge summary from hospital.  Recommended to call us back if she has any question, concern or if she feels worsening of the symptom.  Follow-up in 3 weeks.  Time spent 60 minutes.    Daemion Mcniel T., MD 8/23/201811:18 AM

## 2016-09-13 ENCOUNTER — Telehealth (HOSPITAL_COMMUNITY): Payer: Self-pay

## 2016-09-13 ENCOUNTER — Other Ambulatory Visit (HOSPITAL_COMMUNITY): Payer: Self-pay | Admitting: Psychiatry

## 2016-09-13 NOTE — Telephone Encounter (Signed)
Patient called and she reports that the Lorazepam makes her feel jittery and nauseous, she would like to know if you can give her something different. Please review and advise, thank you

## 2016-09-13 NOTE — Telephone Encounter (Signed)
She can take half tablet if needed twice a day.

## 2016-09-14 NOTE — Telephone Encounter (Signed)
I called patient and advised her to take only 1/2 the tab. Patient agreed to this and will call me back if they symptoms persist.

## 2016-09-15 ENCOUNTER — Encounter (HOSPITAL_COMMUNITY): Payer: Self-pay | Admitting: Clinical

## 2016-09-15 ENCOUNTER — Ambulatory Visit (INDEPENDENT_AMBULATORY_CARE_PROVIDER_SITE_OTHER): Payer: Medicare Other | Admitting: Clinical

## 2016-09-15 DIAGNOSIS — F332 Major depressive disorder, recurrent severe without psychotic features: Secondary | ICD-10-CM

## 2016-09-15 DIAGNOSIS — F411 Generalized anxiety disorder: Secondary | ICD-10-CM

## 2016-09-15 NOTE — Progress Notes (Signed)
   THERAPIST PROGRESS NOTE  Session Time: 1:30pm-2:45pm  Participation Level: Active  Behavioral Response: Well GroomedAlertDepressed  Type of Therapy: Individual Therapy  Treatment Goals addressed: decrease depression and anxiety symptoms  Interventions: CBT, Motivational Interviewing, Coping skills, Mindfulness  Summary: Sandy Hodges is a 71 y.o. female who presents with Major Depressive Disorder, recurrent, severe, Generalized Anxiety Disorder, and grief. .   Suicidal/Homicidal: No  Therapist Response: Clinican completed CCA in order to assess current mood and behavior symptoms. Identified severe depressive symptoms, anxiety, and grief following several significant losses over the past year.   Plan: Return again in 1-2 weeks.  Diagnosis: Axis I: Major Depression, Recurrent severe. Generalized Anxiety Disorder      Alvera SinghJessica Schlosberg, LCSW  Powell,Frances A, LCSW 09/15/2016

## 2016-09-15 NOTE — Progress Notes (Signed)
Comprehensive Clinical Assessment (CCA) Note  09/15/2016 Sandy Hodges [id number]  Visit Diagnosis:      ICD-10-CM   1. Severe episode of recurrent major depressive disorder, without psychotic features (HCC) F33.2   2. Generalized anxiety disorder F41.1       CCA Part One  Part One has been completed on paper by the patient.  (See scanned document in Chart Review)  CCA Part Two A  Intake/Chief Complaint:  CCA Intake With Chief Complaint CCA Part Two Date: 09/15/16 CCA Part Two Time: 1340 Chief Complaint/Presenting Problem: depression, anxiety, panic attacks Patients Currently Reported Symptoms/Problems: Depression sxs have been present all life. recent significant losses- husband, mother, sister. Knee surgery has increased pain, stiffness. Lonely- children, grandchildren. Anxiety- worry about finances.   Collateral Involvement: 2 sons- especially [name], friends, church Individual's Strengths: reading, gardening, doing puzzles Individual's Preferences: reading, gardening, puzzles Individual's Abilities: spending time with friends Type of Services Patient Feels Are Needed: individual outpatient therapy, medication management Initial Clinical Notes/Concerns: medication has not started to work yet. [name] was hospitalized at [facility] in early August 2018 after feeling very depressed.   Mental Health Symptoms Depression:  Depression: Fatigue, Change in energy/activity, Increase/decrease in appetite, Sleep (too much or little), Tearfulness, Weight gain/loss, Hopelessness  Mania:  Mania: N/A  Anxiety:   Anxiety: Difficulty concentrating, Fatigue, Irritability, Sleep, Tension, Worrying, Restlessness (panic attacks: 1x per week, racing heartbeat, )  Psychosis:  Psychosis: N/A  Trauma:  Trauma: N/A  Obsessions:  Obsessions: N/A  Compulsions:  Compulsions: N/A  Inattention:  Inattention: N/A  Hyperactivity/Impulsivity:  Hyperactivity/Impulsivity: N/A  Oppositional/Defiant Behaviors:   Oppositional/Defiant Behaviors: N/A  Borderline Personality:  Emotional Irregularity: N/A  Other Mood/Personality Symptoms:      Mental Status Exam Appearance and self-care  Stature:  Stature: Average  Weight:  Weight: Average weight  Clothing:  Clothing: Neat/clean  Grooming:  Grooming: Normal  Cosmetic use:  Cosmetic Use: Age appropriate  Posture/gait:  Posture/Gait: Normal  Motor activity:  Motor Activity: Not Remarkable  Sensorium  Attention:  Attention: Normal  Concentration:  Concentration: Normal  Orientation:  Orientation: X5  Recall/memory:  Recall/Memory: Normal (not as alert as it has been, forgets where she puts things)  Affect and Mood  Affect:  Affect: Depressed, Tearful  Mood:  Mood: Depressed  Relating  Eye contact:  Eye Contact: Normal  Facial expression:  Facial Expression: Sad  Attitude toward examiner:  Attitude Toward Examiner: Cooperative  Thought and Language  Speech flow: Speech Flow: Normal  Thought content:  Thought Content: Appropriate to mood and circumstances  Preoccupation:   significant losses over the past year  Hallucinations:   NA  Organization:   NA  Company secretaryxecutive Functions  Fund of Knowledge:  Fund of Knowledge: Average  Intelligence:  Intelligence: Average  Abstraction:  Abstraction: Abstract  Judgement:  Judgement: Normal  Reality Testing:  Reality Testing: Adequate  Insight:  Insight: Good  Decision Making:  Decision Making: Normal  Social Functioning  Social Maturity:  Social Maturity: Responsible  Social Judgement:  Social Judgement: Normal  Stress  Stressors:  Stressors: Veterinary surgeonGrief/losses, Scientist, forensicMoney  Coping Ability:  Coping Ability: Normal  Skill Deficits:   NA  Supports:   family, friends, church   Family and Psychosocial History: Family history Marital status: Widowed Widowed, when?: December 2017 Are you sexually active?: No What is your sexual orientation?: heterosexual Has your sexual activity been affected by drugs, alcohol,  medication, or emotional stress?: no Does patient have children?: Yes How many children?: 2  How is patient's relationship with their children?: Ages 51 and 34. close with both sons, feels nervous around one son.   Childhood History:  Childhood History By whom was/is the patient raised?: Both parents Description of patient's relationship with caregiver when they were a child: good relationship Patient's description of current relationship with people who raised him/her: both parents are deceased How were you disciplined when you got in trouble as a child/adolescent?: switches Does patient have siblings?: Yes Number of Siblings: 3 Description of patient's current relationship with siblings: 1 brother and 1 sister living, 1 sister died in 2016/06/01. Close with siblings.  Did patient suffer any verbal/emotional/physical/sexual abuse as a child?: No Did patient suffer from severe childhood neglect?: No Has patient ever been sexually abused/assaulted/raped as an adolescent or adult?: No Was the patient ever a victim of a crime or a disaster?: No Witnessed domestic violence?: No Has patient been effected by domestic violence as an adult?: No  CCA Part Two B  Employment/Work Situation: Employment / Work Psychologist, occupational Employment situation: Retired Psychologist, clinical job has been impacted by current illness: No What is the longest time patient has a held a job?: only worked off and on, mostly stayed home with the children Has patient ever been in the [employer]?: No Are There Guns or Other Weapons in Your Home?: No  Education: Education Last Grade Completed: 12 Did Garment/textile technologist From [employer]?: Yes Did Theme park manager?: No Did Designer, television/film set?: No Did You Have Any Special Interests In School?: no Did You Have An Individualized Education Program (IIEP): No Did You Have Any Difficulty At [employer]?: No  Religion: Religion/Spirituality Are You A Religious Person?: Yes What is Your  Religious Affiliation?: Christian How Might This Affect Treatment?: no  Leisure/Recreation: Leisure / Recreation Leisure and Hobbies: going to see grandbabies in sports  Exercise/Diet: Exercise/Diet Do You Exercise?: Yes What Type of Exercise Do You Do?: Run/Walk How Many Times a Week Do You Exercise?: Daily Have You Gained or Lost A Significant Amount of Weight in the Past Six Months?: Yes-Lost Number of Pounds Lost?: 8 Do You Follow a Special Diet?: No Do You Have Any Trouble Sleeping?: Yes Explanation of Sleeping Difficulties: difficulty falling asleep and staying asleep  CCA Part Two C  Alcohol/Drug Use: Alcohol / Drug Use History of alcohol / drug use?: No history of alcohol / drug abuse                      CCA Part Three  ASAM's:  Six Dimensions of Multidimensional Assessment  Dimension 1:  Acute Intoxication and/or Withdrawal Potential:     Dimension 2:  Biomedical Conditions and Complications:     Dimension 3:  Emotional, Behavioral, or Cognitive Conditions and Complications:     Dimension 4:  Readiness to Change:     Dimension 5:  Relapse, Continued use, or Continued Problem Potential:     Dimension 6:  Recovery/Living Environment:      Substance use Disorder (SUD)    Social Function:  Social Functioning Social Maturity: Responsible Social Judgement: Normal  Stress:  Stress Stressors: Grief/losses, Money Coping Ability: Normal Patient Takes Medications The Way The Doctor Instructed?: Yes Priority Risk: Moderate Risk  Risk Assessment- Self-Harm Potential: Risk Assessment For Self-Harm Potential Thoughts of Self-Harm: No current thoughts Method: No plan Availability of Means: No access/NA Additional Comments for Self-Harm Potential: became hospitalized at the beginning of August due to depression and anxiety. No current SI  Risk Assessment -Dangerous to Others Potential: Risk Assessment For Dangerous to Others Potential Method: No  Plan Availability of Means: No access or NA Intent: Vague intent or NA Notification Required: No need or identified person  DSM5 Diagnoses: Patient Active Problem List   Diagnosis Date Noted  . MDD (major depressive disorder), recurrent severe, without psychosis (HCC) 07/14/2016  . 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 Nausea   . Anxiety and depression   . Abdominal pain 07/13/2016  . Primary localized osteoarthritis of left knee 06/24/2016  . Primary localized osteoarthritis of right knee 10/16/2015  . Depression 10/16/2014  . Arthritis 11/14/2013  . Essential hypertension 11/14/2013  . Preventive measure 11/14/2013  . Uterine mass 08/01/2013  . Deficiency anemia 07/09/2013  . Fatigue 07/09/2013  . Knee pain 07/09/2013  . Back pain 07/09/2013  . Abdominal pain, unspecified site 07/09/2013  . Leukopenia 07/09/2013  . Flank pain 07/09/2013  . Pancytopenia (HCC) 12/14/2011    Patient Centered Plan: Patient is on the following Treatment Plan(s):  Treatment plan to be formulated at next appointment.  Recommendations for Services/Supports/Treatments: Recommendations for Services/Supports/Treatments Recommendations For Services/Supports/Treatments: Individual Therapy, Medication Management  Treatment Plan Summary:    Referrals to Alternative Service(s): Referred to Alternative Service(s):   Place:   Date:   Time:    Referred to Alternative Service(s):   Place:   Date:   Time:    Referred to Alternative Service(s):   Place:   Date:   Time:    Referred to Alternative Service(s):   Place:   Date:   Time:     Veneda Melter, Alexander Mt 09/15/16

## 2016-09-20 ENCOUNTER — Emergency Department (HOSPITAL_COMMUNITY): Payer: Medicare Other

## 2016-09-20 ENCOUNTER — Telehealth (HOSPITAL_COMMUNITY): Payer: Self-pay

## 2016-09-20 ENCOUNTER — Emergency Department (HOSPITAL_COMMUNITY)
Admission: EM | Admit: 2016-09-20 | Discharge: 2016-09-20 | Disposition: A | Discharge status: Home / Self Care | Payer: Medicare Other | Attending: Emergency Medicine | Admitting: Emergency Medicine

## 2016-09-20 ENCOUNTER — Encounter (HOSPITAL_COMMUNITY): Payer: Self-pay | Admitting: Emergency Medicine

## 2016-09-20 ENCOUNTER — Other Ambulatory Visit (HOSPITAL_COMMUNITY): Payer: Self-pay | Admitting: Psychiatry

## 2016-09-20 DIAGNOSIS — M549 Dorsalgia, unspecified: Secondary | ICD-10-CM | POA: Diagnosis not present

## 2016-09-20 DIAGNOSIS — Z96653 Presence of artificial knee joint, bilateral: Secondary | ICD-10-CM | POA: Diagnosis not present

## 2016-09-20 DIAGNOSIS — F419 Anxiety disorder, unspecified: Secondary | ICD-10-CM | POA: Insufficient documentation

## 2016-09-20 DIAGNOSIS — I10 Essential (primary) hypertension: Secondary | ICD-10-CM | POA: Diagnosis not present

## 2016-09-20 DIAGNOSIS — F329 Major depressive disorder, single episode, unspecified: Secondary | ICD-10-CM | POA: Diagnosis not present

## 2016-09-20 DIAGNOSIS — Z79899 Other long term (current) drug therapy: Secondary | ICD-10-CM | POA: Insufficient documentation

## 2016-09-20 DIAGNOSIS — R072 Precordial pain: Secondary | ICD-10-CM | POA: Diagnosis not present

## 2016-09-20 DIAGNOSIS — R079 Chest pain, unspecified: Secondary | ICD-10-CM | POA: Diagnosis present

## 2016-09-20 DIAGNOSIS — Z7982 Long term (current) use of aspirin: Secondary | ICD-10-CM | POA: Diagnosis not present

## 2016-09-20 LAB — CBC
HEMATOCRIT: 34.6 % — AB (ref 36.0–46.0)
Hemoglobin: 11.4 g/dL — ABNORMAL LOW (ref 12.0–15.0)
MCH: 30 pg (ref 26.0–34.0)
MCHC: 32.9 g/dL (ref 30.0–36.0)
MCV: 91.1 fL (ref 78.0–100.0)
Platelets: 131 10*3/uL — ABNORMAL LOW (ref 150–400)
RBC: 3.8 MIL/uL — ABNORMAL LOW (ref 3.87–5.11)
RDW: 12.3 % (ref 11.5–15.5)
WBC: 3.8 10*3/uL — AB (ref 4.0–10.5)

## 2016-09-20 LAB — BASIC METABOLIC PANEL
Anion gap: 9 (ref 5–15)
BUN: 12 mg/dL (ref 6–20)
CHLORIDE: 95 mmol/L — AB (ref 101–111)
CO2: 25 mmol/L (ref 22–32)
CREATININE: 0.91 mg/dL (ref 0.44–1.00)
Calcium: 9.8 mg/dL (ref 8.9–10.3)
GFR calc Af Amer: >60 mL/min (ref >60)
GFR calc non Af Amer: >60 mL/min (ref >60)
GLUCOSE: 99 mg/dL (ref 65–99)
POTASSIUM: 3.5 mmol/L (ref 3.5–5.1)
SODIUM: 129 mmol/L — AB (ref 135–145)

## 2016-09-20 LAB — I-STAT TROPONIN, ED: Troponin i, poc: 0 ng/mL (ref 0.00–0.08)

## 2016-09-20 MED ORDER — LORAZEPAM 1 MG PO TABS
1.0000 mg | ORAL_TABLET | Freq: Once | ORAL | Status: AC
  Administered 2016-09-20: 1 mg via ORAL
  Filled 2016-09-20: qty 1

## 2016-09-20 MED ORDER — ALPRAZOLAM 0.5 MG PO TABS: 0.5000 mg | ORAL_TABLET | Freq: Every evening | ORAL | 0 refills | Status: DC | PRN

## 2016-09-20 MED ORDER — KETOROLAC TROMETHAMINE 15 MG/ML IJ SOLN
15.0000 mg | Freq: Once | INTRAMUSCULAR | Status: AC
  Administered 2016-09-20: 15 mg via INTRAVENOUS
  Filled 2016-09-20: qty 1

## 2016-09-20 NOTE — ED Provider Notes (Signed)
[facility]-EMERGENCY DEPT Provider Note   CSN: [id number] Arrival date & time: 09/20/16  0710     History   Chief Complaint Chief Complaint  Patient presents with  . Chest Pain    HPI Sandy Hodges is a 71 y.o. female.  HPI Patient is a 71 year old female history of anxiety and depression who presents to emergency department complaining of upper back pain and bilateral radiating chest discomfort.  She reports the chest discomfort is located in the area of her lower chest under both breasts and radiates towards the front.  She states this woke her up at approximately 2 in the morning.  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  EMS reports they gave nitroglycerin with improvement in her pain.  No prior history of cardiac disease.  She states she is still struggling significantly with depression and anxiety since the death of her husband in December 2017.  She was given aspirin prior to arrival.no fevers or chills.  No shortness of breath.  Denies orthopnea.  No unilateral leg swelling.  No history DVT or pulmonary embolism   Past Medical History:  Diagnosis Date  . Abdominal pain, unspecified site 07/09/2013  . Anxiety   . Arthritis 11/14/2013  . Back pain 07/09/2013  . Blood dyscrasia    Pancytopenial; [facility] Dr. Bertis RuddyGorsuch  . Depression 10/16/2014  . Essential hypertension 11/14/2013  . Fatigue 07/09/2013  . Fibromyalgia   . Hyperlipidemia   . Hypertension   . Knee pain 07/09/2013  . OA (osteoarthritis) of knee   . Pancytopenia (HCC) 12/14/2011  . Pancytopenia (HCC)   . PONV (postoperative nausea and vomiting)    nausea an vomiting  . Unspecified deficiency anemia 07/09/2013    Patient Active Problem List   Diagnosis Date Noted  . MDD (major depressive disorder), recurrent severe, without psychosis (HCC) 07/14/2016  . 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 Nausea   . Anxiety and depression   . Abdominal pain 07/13/2016  .  Primary localized osteoarthritis of left knee 06/24/2016  . Primary localized osteoarthritis of right knee 10/16/2015  . Depression 10/16/2014  . Arthritis 11/14/2013  . Essential hypertension 11/14/2013  . Preventive measure 11/14/2013  . Uterine mass 08/01/2013  . Deficiency anemia 07/09/2013  . Fatigue 07/09/2013  . Knee pain 07/09/2013  . Back pain 07/09/2013  . Abdominal pain, unspecified site 07/09/2013  . Leukopenia 07/09/2013  . Flank pain 07/09/2013  . Pancytopenia (HCC) 12/14/2011    Past Surgical History:  Procedure Laterality Date  . COLONOSCOPY    . JOINT REPLACEMENT    . 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 ROTATOR CUFF REPAIR Right 09/2014  . 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;  Service: Orthopedics;  Laterality: Right;  . TOTAL KNEE ARTHROPLASTY Left 06/24/2016   Procedure: LEFT TOTAL KNEE ARTHROPLASTY;  Surgeon: Frederico Hammanaffrey, Daniel, MD;  [facility];  Service: Orthopedics;  Laterality: Left;    OB History    No data available       Home Medications    Prior to Admission medications   Medication Sig Start Date End Date Taking? Authorizing Provider  aspirin EC 81 MG tablet Take 81 mg by mouth at bedtime.     [provider]  atenolol (TENORMIN) 50 MG tablet Take 50 mg by mouth every evening.  08/01/16  [provider]  Calcium Carbonate-Vit D-Min (CALCIUM 600+D PLUS MINERALS PO) Take 1 tablet by mouth 2 (two) times daily.    [provider]  cetirizine (ZYRTEC) 10 MG tablet Take 10 mg by mouth daily.    [provider]  fenofibrate (TRICOR) 145 MG tablet Take 145 mg by mouth every evening.  12/07/11   [provider]  lisinopril-hydrochlorothiazide (PRINZIDE,ZESTORETIC) 20-12.5 MG per tablet Take 1 tablet by mouth every evening.  04/28/14   [provider]  loratadine (CLARITIN) 10 MG tablet Take 10  mg by mouth 2 (two) times daily.     [provider]  LORazepam (ATIVAN) 1 MG tablet Take 1/2 to 1 tab twice a daily as needed for anxiety 09/08/16   Arfeen, Phillips Grout, MD  Multiple Vitamin (MULTIVITAMIN WITH MINERALS) TABS tablet Take 1 tablet by mouth every morning. One-A-Day Women 50+    [provider]  sertraline (ZOLOFT) 100 MG tablet Take 1 tablet (100 mg total) by mouth daily. 09/08/16   Arfeen, Phillips Grout, MD  sucralfate (CARAFATE) 1 GM/10ML suspension Take 10 mLs (1 g total) by mouth 4 (four) times daily -  with meals and at bedtime. 07/10/16   Shaune Pollack, MD    Family History Family History  Problem Relation Age of Onset  . Cancer Mother        breast ca  . Hypertension Father   . CVA Father     Social History Social History  Substance Use Topics  . Smoking status: Never Smoker  . Smokeless tobacco: Never Used  . Alcohol use No     Allergies   No known allergies   Review of Systems Review of Systems  All other systems reviewed and are negative.    Physical Exam Updated Vital Signs BP 120/75   Pulse 68   Temp 98 F (36.7 C) (Oral)   Resp 18   SpO2 96%   Physical Exam  Constitutional: She is oriented to person, place, and time. She appears well-developed and well-nourished. No distress.  HENT:  Head: Normocephalic and atraumatic.  Eyes: EOM are normal.  Neck: Normal range of motion.  Cardiovascular: Normal rate, regular rhythm and normal heart sounds.   Pulmonary/Chest: Effort normal and breath sounds normal.  Abdominal: Soft. She exhibits no distension. There is no tenderness.  Musculoskeletal: Normal range of motion.  Neurological: She is alert and oriented to person, place, and time.  Skin: Skin is warm and dry.  Psychiatric:  Anxious appearing  Nursing note and vitals reviewed.    ED Treatments / Results  Labs (all labs ordered are listed, but only abnormal results are displayed) Labs Reviewed  BASIC METABOLIC PANEL -  Abnormal; Notable for the following:       Result Value   Sodium 129 (*)    Chloride 95 (*)    All other components within normal limits  CBC - Abnormal; Notable for the following:    WBC 3.8 (*)    RBC 3.80 (*)    Hemoglobin 11.4 (*)    HCT 34.6 (*)    Platelets 131 (*)    All other components within normal limits  I-STAT TROPONIN, ED    EKG  EKG Interpretation  Date/Time:  Tuesday September 20 2016 07:17:16 EDT Ventricular Rate:  67 PR Interval:    QRS Duration: 142 QT Interval:  435 QTC Calculation: 460 R Axis:   -19 Text Interpretation:  Sinus rhythm Prolonged PR interval Right bundle branch block  Probable left ventricular hypertrophy No significant change was found Confirmed by Azalia Bilis ([id number]) on 09/20/2016 8:06:09 AM       Radiology Dg Chest 2 View  Result Date: 09/20/2016 CLINICAL DATA:  Woke up this morning with pain all across bra line area (front and back), mid back to ribs mostly - hx of htn, nonsmoker, unknown heart or lung issues EXAM: CHEST  2 VIEW COMPARISON:  None. FINDINGS: The heart size and mediastinal contours are within normal limits. Both lungs are clear. The visualized skeletal structures are unremarkable. IMPRESSION: No active cardiopulmonary disease. Electronically Signed   By: Genevive Bi M.D.   On: 09/20/2016 08:08    Procedures Procedures (including critical care time)  Medications Ordered in ED Medications  LORazepam (ATIVAN) tablet 1 mg (1 mg Oral Given 09/20/16 0957)  ketorolac (TORADOL) 15 MG/ML injection 15 mg (15 mg Intravenous Given 09/20/16 0957)     Initial Impression / Assessment and Plan / ED Course  I have reviewed the triage vital signs and the nursing notes.  Pertinent labs & imaging results that were available during my care of the patient were reviewed by me and considered in my medical decision making (see chart for details).     Patient with resolution of symptoms in the emergency department.  EKG without ischemic  changes.  Troponin negative.  Pain is been constant since approximately 2 AM.  I do not believe this a presentation of pulmonary embolism or acute coronary syndrome.  Outpatient primary care follow-up.  Patient be discharged home in good condition.  She understands to return to the ER for new or worsening symptoms  Final Clinical Impressions(s) / ED Diagnoses   Final diagnoses:  Upper back pain  Precordial pain  Anxiety    New Prescriptions New Prescriptions   No medications on file     Azalia Bilis, MD 09/20/16 1147

## 2016-09-20 NOTE — ED Triage Notes (Signed)
[facility] complaining of bilateral chest pressure radiating to her back that woke her at 0200 this morning unlike any pain she has felt before.  Patient rated pain 8/10, received 3x SL nitro from EMS PTA, pain reduced to 3/10 on arrival to ED.  Denies other symptoms.  States she has recently started being treated for depression and anxiety after her husband died in December.  Patient alert and oriented and in no apparent distress at this time.  20g saline lock in left forearm.  Also received 324mg  aspirin PTA.

## 2016-09-20 NOTE — Telephone Encounter (Signed)
Patient is calling, very tearful. She went to the ED today, she said she is still struggling really bad with anxiety. She is having a hard time catching her breath and chest pains. Patient states she is anxious all the time and the Ativan is not helping. Please review and advise, thank you

## 2016-09-20 NOTE — Telephone Encounter (Signed)
I returned patient's phone call.  She has a lot of anxiety and nervousness.  She went emergency room for anxiety today.  She like to go back on Xanax which was helping her and prescribed by primary care physician.  She had tried Klonopin and recently lorazepam with limited response.  I would discontinue lorazepam and called and Xanax 0.5 mg 1 tablet as needed for severe anxiety.  I also recommended to try Zoloft 150 mg daily to help her residual anxiety and nervousness.  We will discuss more on her next appointment.  I also recommended to call us back if she has any further question.

## 2016-09-22 ENCOUNTER — Encounter (HOSPITAL_COMMUNITY): Payer: Self-pay | Admitting: Clinical

## 2016-09-22 ENCOUNTER — Telehealth (HOSPITAL_COMMUNITY): Payer: Self-pay

## 2016-09-22 ENCOUNTER — Ambulatory Visit (INDEPENDENT_AMBULATORY_CARE_PROVIDER_SITE_OTHER): Payer: Medicare Other | Admitting: Clinical

## 2016-09-22 ENCOUNTER — Other Ambulatory Visit (HOSPITAL_COMMUNITY): Payer: Self-pay

## 2016-09-22 ENCOUNTER — Other Ambulatory Visit (HOSPITAL_COMMUNITY): Payer: Self-pay | Admitting: Psychiatry

## 2016-09-22 DIAGNOSIS — F331 Major depressive disorder, recurrent, moderate: Secondary | ICD-10-CM | POA: Diagnosis not present

## 2016-09-22 MED ORDER — MIRTAZAPINE 15 MG PO TABS: 15.0000 mg | ORAL_TABLET | Freq: Every day | ORAL | 2 refills | Status: DC

## 2016-09-22 MED ORDER — SERTRALINE HCL 100 MG PO TABS: 150.0000 mg | ORAL_TABLET | Freq: Every day | ORAL | 0 refills | Status: DC

## 2016-09-22 NOTE — Progress Notes (Signed)
   THERAPIST PROGRESS NOTE  Session Time: 9:00am-10:00am  Participation Level: Active  Behavioral Response: NeatAlertAnxious and Depressed  Type of Therapy: Individual Therapy  Treatment Goals addressed: improve psychiatric symptoms, moderate mood improve unhelpful thought patterns, learn about diagnosis, healthy coping skills  Interventions: CBT, Motivational Interviewing, psychoeducation, Mindfulness and Grounding  Summary: Sandy Hodges is a 71  y.o. female who presents with Major Depressive Disorder, recurrent episode, severe Suicidal/Homicidal: No -without intent/plan  Therapist Response: Sandy Hodges met with clinician for an individual session. She discussed her psychiatric symptoms, her current life events and her homework. Sandy Hodges entered session complaining of stomach pains and some light headedness. Nurse entered session to review new medications and directions from Dr. Adele Schilder in order to ensure appropriate dosing. Sandy Hodges reports family increased their expectations of her to be able to do things on her own. Sandy Hodges reported a lot of anxiety and loneliness. Sandy Hodges contacted Hospice and will meet with a SW next week to start grief counseling and possible group. Clinician discussed and created treatment plan to address anxiety and depression. Saje also agreed to contact the [employer] about social activities.   Plan: Return again in 1-2 weeks.  Diagnosis: Major Depressive Disorder, recurrent episode, moderate  Carlus Pavlov, LCSW 09/22/16  Powell,Frances A, LCSW 09/22/2016

## 2016-09-22 NOTE — Telephone Encounter (Signed)
Patient is here today to see therapist Brayton CavesJessie, she is still very tearful and is complaining of nausea. Patient states that she is taking her medication as prescribed and is eating a little bit, but she said she has no appetite and every time she eats it turns her stomach. Please review and advise, thank you

## 2016-09-22 NOTE — Telephone Encounter (Signed)
I called patient and let her know that I was calling in the Remeron. Patient will call me Monday if her symptoms persist

## 2016-09-22 NOTE — Telephone Encounter (Signed)
She can try Remeron 15 mg at bedtime which can help her sleep and increase appetite.  Reminded that it may cause sedation

## 2016-09-30 ENCOUNTER — Emergency Department (HOSPITAL_COMMUNITY): Payer: Medicare Other

## 2016-09-30 ENCOUNTER — Emergency Department (HOSPITAL_COMMUNITY)
Admission: EM | Admit: 2016-09-30 | Discharge: 2016-09-30 | Disposition: A | Discharge status: Home / Self Care | Payer: Medicare Other | Attending: Emergency Medicine | Admitting: Emergency Medicine

## 2016-09-30 ENCOUNTER — Encounter (HOSPITAL_COMMUNITY): Payer: Self-pay | Admitting: Emergency Medicine

## 2016-09-30 DIAGNOSIS — S20219A Contusion of unspecified front wall of thorax, initial encounter: Secondary | ICD-10-CM | POA: Insufficient documentation

## 2016-09-30 DIAGNOSIS — Z96653 Presence of artificial knee joint, bilateral: Secondary | ICD-10-CM | POA: Diagnosis not present

## 2016-09-30 DIAGNOSIS — S299XXA Unspecified injury of thorax, initial encounter: Secondary | ICD-10-CM | POA: Diagnosis present

## 2016-09-30 DIAGNOSIS — Z79899 Other long term (current) drug therapy: Secondary | ICD-10-CM | POA: Insufficient documentation

## 2016-09-30 DIAGNOSIS — Y999 Unspecified external cause status: Secondary | ICD-10-CM | POA: Diagnosis not present

## 2016-09-30 DIAGNOSIS — I10 Essential (primary) hypertension: Secondary | ICD-10-CM | POA: Diagnosis not present

## 2016-09-30 DIAGNOSIS — Y9389 Activity, other specified: Secondary | ICD-10-CM | POA: Insufficient documentation

## 2016-09-30 DIAGNOSIS — Z7982 Long term (current) use of aspirin: Secondary | ICD-10-CM | POA: Insufficient documentation

## 2016-09-30 DIAGNOSIS — Y92410 Unspecified street and highway as the place of occurrence of the external cause: Secondary | ICD-10-CM | POA: Diagnosis not present

## 2016-09-30 MED ORDER — NAPROXEN 500 MG PO TABS: 500.0000 mg | ORAL_TABLET | Freq: Two times a day (BID) | ORAL | 0 refills | Status: AC

## 2016-09-30 MED ORDER — OXYCODONE-ACETAMINOPHEN 5-325 MG PO TABS: 1.0000 | ORAL_TABLET | Freq: Four times a day (QID) | ORAL | 0 refills | Status: DC | PRN

## 2016-09-30 MED ORDER — NAPROXEN 500 MG PO TABS
500.0000 mg | ORAL_TABLET | Freq: Once | ORAL | Status: AC
  Administered 2016-09-30: 500 mg via ORAL
  Filled 2016-09-30: qty 1

## 2016-09-30 MED ORDER — ORPHENADRINE CITRATE ER 100 MG PO TB12: 100.0000 mg | ORAL_TABLET | Freq: Two times a day (BID) | ORAL | 0 refills | Status: AC
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
## 2016-10-03 ENCOUNTER — Ambulatory Visit (HOSPITAL_COMMUNITY): Payer: Medicare Other | Admitting: Psychiatry

## 2016-10-06 ENCOUNTER — Ambulatory Visit (HOSPITAL_COMMUNITY): Payer: Self-pay | Admitting: Licensed Clinical Social Worker

## 2016-10-17 ENCOUNTER — Other Ambulatory Visit (HOSPITAL_COMMUNITY): Payer: Self-pay | Admitting: Psychiatry

## 2016-10-17 ENCOUNTER — Telehealth (HOSPITAL_COMMUNITY): Payer: Self-pay

## 2016-10-17 MED ORDER — SERTRALINE HCL 100 MG PO TABS
150.0000 mg | ORAL_TABLET | Freq: Every day | ORAL | 0 refills | Status: DC
Start: 2016-10-17 — End: 2016-10-20

## 2016-10-17 MED ORDER — ALPRAZOLAM 0.5 MG PO TABS: 0.5000 mg | ORAL_TABLET | Freq: Every day | ORAL | 0 refills | Status: DC | PRN

## 2016-10-17 NOTE — Telephone Encounter (Signed)
I called the pharmacy - they had the Xanax precription but patient had never picked it up, I put it in the medication module and asked them to please fill. I called patient and let her know she could pick that up today
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
## 2016-10-17 NOTE — Telephone Encounter (Signed)
Recommended to increase Zoloft 150 mg daily.  She supposed to take Xanax since she did not see improvement with Klonopin and lorazepam.  Is she taking Xanax?

## 2016-10-20 ENCOUNTER — Ambulatory Visit (INDEPENDENT_AMBULATORY_CARE_PROVIDER_SITE_OTHER): Payer: Medicare Other | Admitting: Psychiatry

## 2016-10-20 ENCOUNTER — Encounter (HOSPITAL_COMMUNITY): Payer: Self-pay | Admitting: Psychiatry

## 2016-10-20 ENCOUNTER — Other Ambulatory Visit (HOSPITAL_COMMUNITY): Payer: Self-pay

## 2016-10-20 VITALS — BP 146/80 | HR 74 | Ht 65.0 in | Wt 141.0 lb

## 2016-10-20 DIAGNOSIS — R450 Nervousness: Secondary | ICD-10-CM

## 2016-10-20 DIAGNOSIS — F411 Generalized anxiety disorder: Secondary | ICD-10-CM

## 2016-10-20 DIAGNOSIS — Z9889 Other specified postprocedural states: Secondary | ICD-10-CM | POA: Diagnosis not present

## 2016-10-20 DIAGNOSIS — F410 Panic disorder [episodic paroxysmal anxiety] without agoraphobia: Secondary | ICD-10-CM | POA: Diagnosis not present

## 2016-10-20 DIAGNOSIS — F331 Major depressive disorder, recurrent, moderate: Secondary | ICD-10-CM | POA: Diagnosis not present

## 2016-10-20 DIAGNOSIS — Z634 Disappearance and death of family member: Secondary | ICD-10-CM

## 2016-10-20 MED ORDER — SERTRALINE HCL 100 MG PO TABS: 150.0000 mg | ORAL_TABLET | Freq: Every day | ORAL | 1 refills | Status: DC

## 2016-10-20 MED ORDER — LAMOTRIGINE 25 MG PO TABS: ORAL_TABLET | ORAL | 1 refills | Status: DC
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
## 2016-10-26 ENCOUNTER — Telehealth (HOSPITAL_COMMUNITY): Payer: Self-pay

## 2016-10-26 NOTE — Telephone Encounter (Signed)
Patient is calling because she is still feeling really shaky in her hands and her legs. She is still taking the Ativan 3 times a day, but says it is not helping with the shaking. She is going to follow up with her orthopedic to see if it may have something to do with her knee replacements and if not he can refer her to neurology.

## 2016-11-21 ENCOUNTER — Telehealth (HOSPITAL_COMMUNITY): Payer: Self-pay

## 2016-11-21 NOTE — Telephone Encounter (Signed)
Medication management - refill order for patient's prescribed Lamictal received as was only provided for about a 20 day supply 10/20/16.  Pharmacy requests corrected order.

## 2016-11-22 ENCOUNTER — Other Ambulatory Visit (HOSPITAL_COMMUNITY): Payer: Self-pay | Admitting: Psychiatry

## 2016-11-22 DIAGNOSIS — F331 Major depressive disorder, recurrent, moderate: Secondary | ICD-10-CM

## 2016-11-22 MED ORDER — LAMOTRIGINE 25 MG PO TABS: ORAL_TABLET | ORAL | 1 refills | Status: DC

## 2016-11-25 ENCOUNTER — Other Ambulatory Visit: Payer: Self-pay | Admitting: Hematology and Oncology

## 2016-11-25 DIAGNOSIS — D61818 Other pancytopenia: Secondary | ICD-10-CM

## 2016-11-28 ENCOUNTER — Ambulatory Visit: Payer: Medicare Other | Admitting: Hematology and Oncology

## 2016-11-28 ENCOUNTER — Telehealth: Payer: Self-pay | Admitting: *Deleted

## 2016-11-28 ENCOUNTER — Other Ambulatory Visit: Payer: Medicare Other

## 2016-11-28 NOTE — Telephone Encounter (Signed)
LM to call Dr Bertis RuddyGorsuch' nurse regarding appts. Would she like to reschedule for 3 months from now. Recent labs look OK.

## 2016-11-29 ENCOUNTER — Ambulatory Visit (HOSPITAL_COMMUNITY): Payer: Self-pay | Admitting: Psychiatry

## 2016-12-05 ENCOUNTER — Telehealth: Payer: Self-pay | Admitting: *Deleted

## 2016-12-05 NOTE — Telephone Encounter (Signed)
Pt is OK to reschedule for 3 months from now. Message sent to scheduler

## 2016-12-12 ENCOUNTER — Telehealth: Payer: Self-pay | Admitting: Hematology and Oncology

## 2016-12-12 NOTE — Telephone Encounter (Signed)
Left voicemail for patient regarding appts scheduled per 11/19 sch msg. Sending patient confirmation letter in the mail.

## 2016-12-21 ENCOUNTER — Ambulatory Visit (HOSPITAL_COMMUNITY): Payer: Self-pay | Admitting: Psychiatry

## 2017-01-04 ENCOUNTER — Encounter (HOSPITAL_COMMUNITY): Payer: Self-pay | Admitting: Psychiatry

## 2017-01-04 ENCOUNTER — Ambulatory Visit (HOSPITAL_COMMUNITY): Payer: Medicare Other | Admitting: Psychiatry

## 2017-01-04 DIAGNOSIS — F411 Generalized anxiety disorder: Secondary | ICD-10-CM | POA: Diagnosis not present

## 2017-01-04 DIAGNOSIS — Z79899 Other long term (current) drug therapy: Secondary | ICD-10-CM | POA: Diagnosis not present

## 2017-01-04 DIAGNOSIS — G8929 Other chronic pain: Secondary | ICD-10-CM | POA: Diagnosis not present

## 2017-01-04 DIAGNOSIS — F331 Major depressive disorder, recurrent, moderate: Secondary | ICD-10-CM | POA: Diagnosis not present

## 2017-01-04 MED ORDER — LAMOTRIGINE 25 MG PO TABS: ORAL_TABLET | ORAL | 2 refills | Status: DC

## 2017-01-04 MED ORDER — MIRTAZAPINE 15 MG PO TABS: 15.0000 mg | ORAL_TABLET | Freq: Every day | ORAL | 2 refills | Status: DC

## 2017-01-04 MED ORDER — SERTRALINE HCL 100 MG PO TABS: 150.0000 mg | ORAL_TABLET | Freq: Every day | ORAL | 2 refills | Status: AC

## 2017-01-04 NOTE — Progress Notes (Signed)
BH MD/PA/NP OP Progress Note  01/04/2017 12:52 PM Sandy Hodges  MRN:  [id number]  Chief Complaint: I am doing better with the new medication.  It is helping with anxiety and depression.  HPI: Sandy RhodesBetty came for her follow-up appointment.  She is not taking Lamictal 50 mg daily.  She is feeling much better with the medication.  She denies any irritability, anger, mania, psychosis or any crying spells.  Her anxiety is much better.  She really like this new medication.  She still have chronic pain but it is getting slowly better.  She spent Thanksgiving with her son.  Patient also going to start counseling with hospice in first week of January.  She is taking Zoloft and Remeron.  Her primary care physician is prescribing lorazepam which he takes 1 mg up to 3 times a day.  She has noticed since taking the Lamictal her Ativan has been cut down.  Patient lives by herself however she has 2 sons and 6 grandchildren who live close by.  Her energy level is good.  Her sleep is improved.  She denies any paranoia or any hallucination.  Her vital signs are stable.  Visit Diagnosis:    ICD-10-CM   1. 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 Generalized anxiety disorder F41.1 sertraline (ZOLOFT) 100 MG tablet    Past Psychiatric History: Reviewed. Patient denies any history of suicidal attempt.  She was getting Xanax prescribed by primary care physician when her husband diagnosed with cancer who died December 2017.  [facility] to see whether depression and having suicidal thoughts.  In the hospital she was given Lexapro, Cymbalta, Klonopin were discontinued due to lack of efficacy.  We tried Ativan but she call us back and requested to go back on Xanax.  However when she tried Xanax she did not see any improvement in her primary care physician resume Ativan.  Patient  denies any history of hallucination, paranoia, psychosis or any mania.  Past Medical History:  Past Medical History:  Diagnosis Date  . Abdominal pain, unspecified site 07/09/2013  . Anxiety   . Arthritis 11/14/2013  . Back pain 07/09/2013  . Blood dyscrasia    Pancytopenial; [facility] Dr. Bertis RuddyGorsuch  . Depression 10/16/2014  . Essential hypertension 11/14/2013  . Fatigue 07/09/2013  . Fibromyalgia   . Hyperlipidemia   . Hypertension   . Knee pain 07/09/2013  . OA (osteoarthritis) of knee   . Pancytopenia (HCC) 12/14/2011  . Pancytopenia (HCC)   . PONV (postoperative nausea and vomiting)    nausea an vomiting  . Unspecified deficiency anemia 07/09/2013    Past Surgical History:  Procedure Laterality Date  . COLONOSCOPY    . JOINT REPLACEMENT    . 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 ROTATOR CUFF REPAIR Right 09/2014  . 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 TOTAL KNEE ARTHROPLASTY Left 06/24/2016   Procedure: LEFT TOTAL KNEE ARTHROPLASTY;  Surgeon: Frederico Hammanaffrey, Daniel, MD;  [facility];  Service: Orthopedics;  Laterality: Left;    Family Psychiatric History: Viewed.  Family History:  Family History  Problem Relation Age of Onset  . Cancer Mother  breast ca  . Hypertension Father   . CVA Father     Social History:  Social History   Socioeconomic History  . Marital status: Widowed    Spouse name: Not on file  . Number of children: Not on file  . Years of education: Not on file  . Highest education level: Not on file  Social Needs  . Financial resource strain: Not on file  . Food insecurity - worry: Not on file  . Food insecurity - inability: Not on file  . Transportation needs - medical: Not on file  . Transportation needs - non-medical: Not on file  Occupational History  . Not on file  Tobacco Use   . Smoking status: Never Smoker  . Smokeless tobacco: Never Used  Substance and Sexual Activity  . Alcohol use: No  . Drug use: No  . Sexual activity: Not on file  Other Topics Concern  . Not on file  Social History Narrative  . Not on file    Allergies:  Allergies  Allergen Reactions  . No Known Allergies     Metabolic Disorder Labs: No results found for: HGBA1C, MPG No results found for: PROLACTIN No results found for: CHOL, TRIG, HDL, CHOLHDL, VLDL, LDLCALC Lab Results  Component Value Date   TSH 0.574 07/09/2013    Therapeutic Level Labs: No results found for: LITHIUM No results found for: VALPROATE No components found for:  CBMZ  Current Medications: Current Outpatient Medications  Medication Sig Dispense Refill  . aspirin EC 81 MG tablet Take 81 mg by mouth at bedtime.     Marland Kitchen. atenolol (TENORMIN) 50 MG tablet Take 25 mg by mouth every evening.   0  . Calcium Carbonate-Vit D-Min (CALCIUM 600+D PLUS MINERALS PO) Take 1 tablet by mouth daily with breakfast.     . fenofibrate (TRICOR) 145 MG tablet Take 145 mg by mouth every evening.     . lamoTRIgine (LAMICTAL) 25 MG tablet Take 2 tab daily 60 tablet 1  . lisinopril-hydrochlorothiazide (PRINZIDE,ZESTORETIC) 20-12.5 MG per tablet Take 1 tablet by mouth every evening.   0  . LORazepam (ATIVAN) 1 MG tablet Take 0.5-1 mg by mouth 3 (three) times daily as needed for anxiety.  0  . mirtazapine (REMERON) 15 MG tablet Take 1 tablet (15 mg total) by mouth at bedtime. 30 tablet 2  . Multiple Vitamin (MULTIVITAMIN WITH MINERALS) TABS tablet Take 1 tablet by mouth every morning. One-A-Day Women 50+    . naproxen (NAPROSYN) 500 MG tablet Take 1 tablet (500 mg total) by mouth 2 (two) times daily. 30 tablet 0  . orphenadrine (NORFLEX) 100 MG tablet Take 1 tablet (100 mg total) by mouth 2 (two) times daily. 30 tablet 0  . sertraline (ZOLOFT) 100 MG tablet Take 1.5 tablets (150 mg total) by mouth daily. 45 tablet 1   No current  facility-administered medications for this visit.      Musculoskeletal: Strength & Muscle Tone: within normal limits Gait & Station: normal Patient leans: N/A  Psychiatric Specialty Exam: ROS  Blood pressure 122/74, pulse 76, height 5\' 5"  (1.651 m), weight 148 lb 6.4 oz (67.3 kg).There is no height or weight on file to calculate BMI.  General Appearance: Casual  Eye Contact:  Good  Speech:  Clear and Coherent  Volume:  Normal  Mood:  Euthymic  Affect:  Appropriate  Thought Process:  Goal Directed  Orientation:  Full (Time, Place, and Person)  Thought Content: Logical   Suicidal Thoughts:  No  Homicidal Thoughts:  No  Memory:  Immediate;   Good Recent;   Good Remote;   Good  Judgement:  Good  Insight:  Good  Psychomotor Activity:  Normal  Concentration:  Concentration: Good and Attention Span: Good  Recall:  Good  Fund of Knowledge: Good  Language: Good  Akathisia:  No  Handed:  Right  AIMS (if indicated): not done  Assets:  Communication Skills Desire for Improvement Housing Resilience Social Support  ADL's:  Intact  Cognition: WNL  Sleep:  Good   Screenings: AIMS     [facility] (Dis[facility]  AIMS Total Score  0    AUDIT     Admission (Discharged) from [facility]  Alcohol Use Disorder Identification Test Final Score (AUDIT)  0    GAD-7     Counselor from 09/22/2016 in [facility] [place]  Total GAD-7 Score  19    PHQ2-9     Counselor from 09/22/2016 in [facility] [place]  PHQ-2 Total Score  6  PHQ-9 Total Score  21       Assessment and Plan: Major depressive disorder, recurrent.  Anxiety disorder NOS.  Patient doing much better on her current psychiatric medication.  Continue Zoloft 150 mg daily, Remeron 15 mg at bedtime and Lamictal 50 mg daily.  Patient has no  rash, itching, tremors or shakes.  She is getting lorazepam 1 mg up to 3 times a day from her primary care physician.  Patient will also resume counseling at hospice in first week of January.  Discussed medication side effects and benefits.  Recommended to call us back if she has any question, concern if she feels worsening of the symptoms.  Follow-up in 3 months.   Cleotis Nipper, MD 01/04/2017, 12:52 PM

## 2017-03-07 ENCOUNTER — Encounter: Payer: Self-pay | Admitting: Hematology and Oncology

## 2017-03-07 ENCOUNTER — Inpatient Hospital Stay: Payer: Medicare Other | Attending: Hematology and Oncology

## 2017-03-07 ENCOUNTER — Inpatient Hospital Stay (HOSPITAL_BASED_OUTPATIENT_CLINIC_OR_DEPARTMENT_OTHER): Payer: Medicare Other | Admitting: Hematology and Oncology

## 2017-03-07 DIAGNOSIS — Z79899 Other long term (current) drug therapy: Secondary | ICD-10-CM | POA: Diagnosis not present

## 2017-03-07 DIAGNOSIS — R5383 Other fatigue: Secondary | ICD-10-CM | POA: Diagnosis not present

## 2017-03-07 DIAGNOSIS — M1990 Unspecified osteoarthritis, unspecified site: Secondary | ICD-10-CM | POA: Insufficient documentation

## 2017-03-07 DIAGNOSIS — Z7982 Long term (current) use of aspirin: Secondary | ICD-10-CM | POA: Diagnosis not present

## 2017-03-07 DIAGNOSIS — F329 Major depressive disorder, single episode, unspecified: Secondary | ICD-10-CM | POA: Diagnosis not present

## 2017-03-07 DIAGNOSIS — D61818 Other pancytopenia: Secondary | ICD-10-CM

## 2017-03-07 LAB — CBC WITH DIFFERENTIAL/PLATELET
Basophils Absolute: 0 10*3/uL (ref 0.0–0.1)
Basophils Relative: 1 %
EOS ABS: 0.1 10*3/uL (ref 0.0–0.5)
EOS PCT: 4 %
HCT: 38.9 % (ref 34.8–46.6)
Hemoglobin: 12.8 g/dL (ref 11.6–15.9)
LYMPHS ABS: 1 10*3/uL (ref 0.9–3.3)
LYMPHS PCT: 27 %
MCH: 30.7 pg (ref 25.1–34.0)
MCHC: 32.9 g/dL (ref 31.5–36.0)
MCV: 93.3 fL (ref 79.5–101.0)
MONO ABS: 0.3 10*3/uL (ref 0.1–0.9)
MONOS PCT: 7 %
Neutro Abs: 2.2 10*3/uL (ref 1.5–6.5)
Neutrophils Relative %: 61 %
PLATELETS: 142 10*3/uL — AB (ref 145–400)
RBC: 4.17 MIL/uL (ref 3.70–5.45)
RDW: 13.7 % (ref 11.2–14.5)
WBC: 3.6 10*3/uL — AB (ref 3.9–10.3)

## 2017-03-07 NOTE — Progress Notes (Signed)
[facility] OFFICE PROGRESS NOTE  Sandy Hodges, Sharon, MD SUMMARY OF HEMATOLOGIC HISTORY:  This patient had extensive evaluation 2 years ago for fluctuation of her blood work up with chronic leukopenia and anemia. She also had abnormal CT scan and MRI of the back. She complained of severe arthritis/bone pain in her mid back which correlated with the abnormal changes seen on MRI several years ago. She also have severe pain in both knees and her right tibia. She also has fatigue and lack of energy and chronic cramps. In June 2015, she had MRI of the pack, CT scan of the abdomen, and extensive blood work which excluded diagnosis of cancer. She was found to have mild iron deficiency anemia and was recommended oral ion supplements. INTERVAL HISTORY: Sandy Hodges 72 y.o. female returns for further follow-up. Her appointment has to be rescheduled recently due to missed appointment. She denies recent infection.  Her appetite is stable and she has gained some weight. She stated that she is coping better now since the death of her husband (who was my patient with MDS) The patient denies any recent signs or symptoms of bleeding such as spontaneous epistaxis, hematuria or hematochezia.   I have reviewed the past medical history, past surgical history, social history and family history with the patient and they are unchanged from previous note.  ALLERGIES:  is allergic to no known allergies.  MEDICATIONS:  Current Outpatient Medications  Medication Sig Dispense Refill  . aspirin EC 81 MG tablet Take 81 mg by mouth at bedtime.     Marland Kitchen. atenolol (TENORMIN) 50 MG tablet Take 25 mg by mouth every evening.   0  . Calcium Carbonate-Vit D-Min (CALCIUM 600+D PLUS MINERALS PO) Take 1 tablet by mouth daily with breakfast.     . fenofibrate (TRICOR) 145 MG tablet Take 145 mg by mouth every evening.     . lamoTRIgine (LAMICTAL) 25 MG tablet Take 2 tab daily 60 tablet 2  .  lisinopril-hydrochlorothiazide (PRINZIDE,ZESTORETIC) 20-12.5 MG per tablet Take 1 tablet by mouth every evening.   0  . LORazepam (ATIVAN) 1 MG tablet Take 0.5-1 mg by mouth 3 (three) times daily as needed for anxiety.  0  . mirtazapine (REMERON) 15 MG tablet Take 1 tablet (15 mg total) by mouth at bedtime. 30 tablet 2  . Multiple Vitamin (MULTIVITAMIN WITH MINERALS) TABS tablet Take 1 tablet by mouth every morning. One-A-Day Women 50+    . naproxen (NAPROSYN) 500 MG tablet Take 1 tablet (500 mg total) by mouth 2 (two) times daily. 30 tablet 0  . orphenadrine (NORFLEX) 100 MG tablet Take 1 tablet (100 mg total) by mouth 2 (two) times daily. 30 tablet 0  . sertraline (ZOLOFT) 100 MG tablet Take 1.5 tablets (150 mg total) by mouth daily. 45 tablet 2   No current facility-administered medications for this visit.      REVIEW OF SYSTEMS:   Constitutional: Denies fevers, chills or night sweats Eyes: Denies blurriness of vision Ears, nose, mouth, throat, and face: Denies mucositis or sore throat Respiratory: Denies cough, dyspnea or wheezes Cardiovascular: Denies palpitation, chest discomfort or lower extremity swelling Gastrointestinal:  Denies nausea, heartburn or change in bowel habits Skin: Denies abnormal skin rashes Lymphatics: Denies new lymphadenopathy or easy bruising Neurological:Denies numbness, tingling or new weaknesses All other systems were reviewed with the patient and are negative.  PHYSICAL EXAMINATION: ECOG PERFORMANCE STATUS: 1 - Symptomatic but completely ambulatory  Vitals:   03/07/17 1442  BP: 140/80  Pulse: 64  Resp: 18  Temp: 98.2 F (36.8 C)  SpO2: 100%   Filed Weights   03/07/17 1442  Weight: 156 lb 4.8 oz (70.9 kg)    GENERAL:alert, no distress and comfortable.  She appears tearful today SKIN: skin color, texture, turgor are normal, no rashes or significant lesions EYES: normal, Conjunctiva are pink and non-injected, sclera clear OROPHARYNX:no exudate,  no erythema and lips, buccal mucosa, and tongue normal  NECK: supple, thyroid normal size, non-tender, without nodularity LYMPH:  no palpable lymphadenopathy in the cervical, axillary or inguinal LUNGS: clear to auscultation and percussion with normal breathing effort HEART: regular rate & rhythm and no murmurs and no lower extremity edema ABDOMEN:abdomen soft, non-tender and normal bowel sounds Musculoskeletal:no cyanosis of digits and no clubbing  NEURO: alert & oriented x 3 with fluent speech, no focal motor/sensory deficits  LABORATORY DATA:  I have reviewed the data as listed     Component Value Date/Time   NA 129 (L) 09/20/2016 0719   NA 144 11/14/2013 0822   K 3.5 09/20/2016 0719   K 3.9 11/14/2013 0822   CL 95 (L) 09/20/2016 0719   CL 110 (H) 06/28/2012 0807   CO2 25 09/20/2016 0719   CO2 25 11/14/2013 0822   GLUCOSE 99 09/20/2016 0719   GLUCOSE 98 11/14/2013 0822   GLUCOSE 88 06/28/2012 0807   BUN 12 09/20/2016 0719   BUN 19.6 11/14/2013 0822   CREATININE 0.91 09/20/2016 0719   CREATININE 0.8 11/14/2013 0822   CALCIUM 9.8 09/20/2016 0719   CALCIUM 9.7 11/14/2013 0822   PROT 7.5 08/19/2016 1716   PROT 6.2 (L) 11/14/2013 0822   ALBUMIN 4.3 08/19/2016 1716   ALBUMIN 3.7 11/14/2013 0822   AST 21 08/19/2016 1716   AST 20 11/14/2013 0822   ALT 14 08/19/2016 1716   ALT 18 11/14/2013 0822   ALKPHOS 28 (L) 08/19/2016 1716   ALKPHOS 27 (L) 11/14/2013 0822   BILITOT 0.5 08/19/2016 1716   BILITOT 0.35 11/14/2013 0822   GFRNONAA >60 09/20/2016 0719   GFRAA >60 09/20/2016 0719    No results found for: SPEP, UPEP  Lab Results  Component Value Date   WBC 3.6 (L) 03/07/2017   NEUTROABS 2.2 03/07/2017   HGB 12.8 03/07/2017   HCT 38.9 03/07/2017   MCV 93.3 03/07/2017   PLT 142 (L) 03/07/2017      Chemistry      Component Value Date/Time   NA 129 (L) 09/20/2016 0719   NA 144 11/14/2013 0822   K 3.5 09/20/2016 0719   K 3.9 11/14/2013 0822   CL 95 (L) 09/20/2016  0719   CL 110 (H) 06/28/2012 0807   CO2 25 09/20/2016 0719   CO2 25 11/14/2013 0822   BUN 12 09/20/2016 0719   BUN 19.6 11/14/2013 0822   CREATININE 0.91 09/20/2016 0719   CREATININE 0.8 11/14/2013 0822      Component Value Date/Time   CALCIUM 9.8 09/20/2016 0719   CALCIUM 9.7 11/14/2013 0822   ALKPHOS 28 (L) 08/19/2016 1716   ALKPHOS 27 (L) 11/14/2013 0822   AST 21 08/19/2016 1716   AST 20 11/14/2013 0822   ALT 14 08/19/2016 1716   ALT 18 11/14/2013 0822   BILITOT 0.5 08/19/2016 1716   BILITOT 0.35 11/14/2013 0822       ASSESSMENT & PLAN:  Pancytopenia (HCC) She has chronic pancytopenia for long time.  Her white blood cell counts and platelet counts fluctuate up and down She had extensive  evaluation in the past with no definitive cause found. She is not symptomatic and I recommend close observation. CBC is much improved compared to her previous results I will see her back in a year for further follow-up  Depression She has severe depression due to significant grieving from her husband, improving The patient stated that she is doing better now However, I can see that coming back to the cancer center for her own evaluation appears to have triggered sad memories of her husband She appears to be holding back tears when I talk about it today I offer her to be seen by another colleague who is in [facility] office The patient will think about it I recommend she talks to her primary care doctor for further discussion I will call her in October for follow-up   No orders of the defined types were placed in this encounter.   All questions were answered. The patient knows to call the clinic with any problems, questions or concerns. No barriers to learning was detected.  I spent 10 minutes counseling the patient face to face. The total time spent in the appointment was 15  minutes and more than 50% was on counseling.     Artis Delay, MD 2/19/20194:07 PM

## 2017-03-07 NOTE — Assessment & Plan Note (Signed)
She has severe depression due to significant grieving from her husband, improving The patient stated that she is doing better now However, I can see that coming back to the cancer center for her own evaluation appears to have triggered sad memories of her husband She appears to be holding back tears when I talk about it today I offer her to be seen by another colleague who is in [facility] office The patient will think about it I recommend she talks to her primary care doctor for further discussion I will call her in October for follow-up

## 2017-03-07 NOTE — Assessment & Plan Note (Signed)
She has chronic pancytopenia for long time.  Her white blood cell counts and platelet counts fluctuate up and down She had extensive evaluation in the past with no definitive cause found. She is not symptomatic and I recommend close observation. CBC is much improved compared to her previous results I will see her back in a year for further follow-up

## 2017-04-04 ENCOUNTER — Encounter (HOSPITAL_COMMUNITY): Payer: Self-pay | Admitting: Psychiatry

## 2017-04-04 ENCOUNTER — Ambulatory Visit (HOSPITAL_COMMUNITY): Payer: Medicare Other | Admitting: Psychiatry

## 2017-04-04 VITALS — BP 130/75 | HR 90 | Ht 65.0 in | Wt 159.6 lb

## 2017-04-04 DIAGNOSIS — F331 Major depressive disorder, recurrent, moderate: Secondary | ICD-10-CM | POA: Diagnosis not present

## 2017-04-04 DIAGNOSIS — F419 Anxiety disorder, unspecified: Secondary | ICD-10-CM

## 2017-04-04 MED ORDER — LAMOTRIGINE 100 MG PO TABS: 100.0000 mg | ORAL_TABLET | Freq: Every day | ORAL | 1 refills | Status: AC

## 2017-04-04 MED ORDER — MIRTAZAPINE 15 MG PO TABS: 15.0000 mg | ORAL_TABLET | Freq: Every day | ORAL | 2 refills | Status: AC

## 2017-04-04 NOTE — Progress Notes (Signed)
BH MD/PA/NP OP Progress Note  04/04/2017 3:47 PM Sandy Hodges  MRN:  [id number]  Chief Complaint: I am very nervous and anxious.  I have 3 funerals last week.  HPI: Sandy Hodges came for her follow-up appointment.  She endorsed increased anxiety and nervousness in past few weeks.  Last week she has to attend 3 funeral.  Her 73 year old neighbor died and she was very sad about it.  She recently finished therapy with hospice however she does not feel that she need to go back for counseling.  She had a loss of her husband much better but recent deaths and going to the funeral make her anxiety worse.  She is taking Lamictal 50 mg daily.  She has no rash, itching or tremors.  She admitted that Lamictal helping her anxiety and irritability.  She is sleeping good.  She is also taking Zoloft and Remeron.  Her appetite is okay.  Her vital signs are stable.  Patient denies any feeling of hopelessness or worthlessness.  She denies any paranoia or any hallucination.  Her appetite is okay.  Recently she seen hematologist and she had blood work.  She is pleased that her blood work was normal.  Patient denies drinking alcohol or using any illegal substances.  She is taking Ativan prescribed by primary care physician.  She cut down her Ativan from the past but recently her anxiety got worse and she started taking again up to 3 times a day.  Visit Diagnosis:    ICD-10-CM   1. 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 Patient denies any history of suicidal attempt. She was getting Xanax prescribed by primary care physician when her husband diagnosed with cancer who died December 2017. [facility] due to depression and having suicidal thoughts. In the hospital she was given zoloft and Lexapro, Cymbalta, Klonopin were discontinued due to lack  of efficacy. We tried Ativan but she call us back and requested to go back on Xanax. However when she tried Xanax she did not see any improvement in her primary care physician resume Ativan. Patient denies any history of hallucination, paranoia, psychosis or any mania.  Past Medical History:  Past Medical History:  Diagnosis Date  . Abdominal pain, unspecified site 07/09/2013  . Anxiety   . Arthritis 11/14/2013  . Back pain 07/09/2013  . Blood dyscrasia    Pancytopenial; [facility] Dr. Bertis RuddyGorsuch  . Depression 10/16/2014  . Essential hypertension 11/14/2013  . Fatigue 07/09/2013  . Fibromyalgia   . Hyperlipidemia   . Hypertension   . Knee pain 07/09/2013  . OA (osteoarthritis) of knee   . Pancytopenia (HCC) 12/14/2011  . Pancytopenia (HCC)   . PONV (postoperative nausea and vomiting)    nausea an vomiting  . Unspecified deficiency anemia 07/09/2013    Past Surgical History:  Procedure Laterality Date  . COLONOSCOPY    . JOINT REPLACEMENT    . 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 ROTATOR CUFF REPAIR Right 09/2014  . 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 TOTAL KNEE ARTHROPLASTY Left 06/24/2016   Procedure: LEFT TOTAL KNEE ARTHROPLASTY;  Surgeon: Frederico Hammanaffrey, Daniel, MD;  [facility];  Service:  Orthopedics;  Laterality: Left;    Family Psychiatric History: Reviewed.  Family History:  Family History  Problem Relation Age of Onset  . Cancer Mother        breast ca  . Hypertension Father   . CVA Father     Social History:  Social History   Socioeconomic History  . Marital status: Widowed    Spouse name: None  . Number of children: None  . Years of education: None  . Highest education level: None  Social Needs  . Financial resource strain: None  . Food insecurity - worry: None  . Food insecurity - inability: None   . Transportation needs - medical: None  . Transportation needs - non-medical: None  Occupational History  . None  Tobacco Use  . Smoking status: Never Smoker  . Smokeless tobacco: Never Used  Substance and Sexual Activity  . Alcohol use: No  . Drug use: No  . Sexual activity: None  Other Topics Concern  . None  Social History Narrative  . None    Allergies:  Allergies  Allergen Reactions  . No Known Allergies     Metabolic Disorder Labs: No results found for: HGBA1C, MPG No results found for: PROLACTIN No results found for: CHOL, TRIG, HDL, CHOLHDL, VLDL, LDLCALC Lab Results  Component Value Date   TSH 0.574 07/09/2013    Therapeutic Level Labs: No results found for: LITHIUM No results found for: VALPROATE No components found for:  CBMZ  Current Medications: Current Outpatient Medications  Medication Sig Dispense Refill  . aspirin EC 81 MG tablet Take 81 mg by mouth at bedtime.     Marland Kitchen atenolol (TENORMIN) 50 MG tablet Take 25 mg by mouth every evening.   0  . Calcium Carbonate-Vit D-Min (CALCIUM 600+D PLUS MINERALS PO) Take 1 tablet by mouth daily with breakfast.     . fenofibrate (TRICOR) 145 MG tablet Take 145 mg by mouth every evening.     . lamoTRIgine (LAMICTAL) 25 MG tablet Take 2 tab daily 60 tablet 2  . lisinopril-hydrochlorothiazide (PRINZIDE,ZESTORETIC) 20-12.5 MG per tablet Take 1 tablet by mouth every evening.   0  . LORazepam (ATIVAN) 1 MG tablet Take 0.5-1 mg by mouth 3 (three) times daily as needed for anxiety.  0  . mirtazapine (REMERON) 15 MG tablet Take 1 tablet (15 mg total) by mouth at bedtime. 30 tablet 2  . Multiple Vitamin (MULTIVITAMIN WITH MINERALS) TABS tablet Take 1 tablet by mouth every morning. One-A-Day Women 50+    . naproxen (NAPROSYN) 500 MG tablet Take 1 tablet (500 mg total) by mouth 2 (two) times daily. 30 tablet 0  . sertraline (ZOLOFT) 100 MG tablet Take 1.5 tablets (150 mg total) by mouth daily. 45 tablet 2  . orphenadrine  (NORFLEX) 100 MG tablet Take 1 tablet (100 mg total) by mouth 2 (two) times daily. (Patient not taking: Reported on 04/04/2017) 30 tablet 0   No current facility-administered medications for this visit.      Musculoskeletal: Strength & Muscle Tone: within normal limits Gait & Station: normal Patient leans: N/A  Psychiatric Specialty Exam: ROS  Blood pressure 130/75, pulse 90, height 5\' 5"  (1.651 m), weight 159 lb 9.6 oz (72.4 kg), SpO2 96 %.Body mass index is 26.56 kg/m.  General Appearance: Casual  Eye Contact:  Good  Speech:  Slow  Volume:  Normal  Mood:  Anxious and Depressed  Affect:  Congruent  Thought Process:  Goal Directed  Orientation:  Full (  Time, Place, and Person)  Thought Content: Rumination   Suicidal Thoughts:  No  Homicidal Thoughts:  No  Memory:  Immediate;   Fair Recent;   Fair Remote;   Fair  Judgement:  Good  Insight:  Good  Psychomotor Activity:  Normal  Concentration:  Concentration: Fair and Attention Span: Fair  Recall:  Good  Fund of Knowledge: Good  Language: Good  Akathisia:  No  Handed:  Right  AIMS (if indicated): not done  Assets:  Communication Skills Desire for Improvement Housing Resilience Social Support  ADL's:  Intact  Cognition: WNL  Sleep:  Fair   Screenings: AIMS     [facility] (Dis[facility]) [facility]  AIMS Total Score  0    AUDIT     Admission (Discharged) [facility]  Alcohol Use Disorder Identification Test Final Score (AUDIT)  0    GAD-7     Counselor from 09[facility]22[facility]2016 [facility] [place]  Total GAD-7 Score  19    PHQ2-9     Counselor from 09/22/2016 in [facility] [place]  PHQ-2 Total Score  6  PHQ-9 Total Score  21       Assessment and Plan: Major depressive disorder, recurrent.  Anxiety disorder NOS.  Reassurance given.   One more time offered to continue counseling with hospice but patient does not feel she needed.  Recommended to increase Lamictal 75 mg for 3 weeks and then 100 mg daily.  Patient has enough Lamictal 25 mg and she will take 3 tablets daily and then she will start 100 mg daily.  Patient has no rash, itching, tremors or shakes.  Continue Zoloft 150 mg daily and Remeron 15 mg at bedtime.  Recommended to reduce lorazepam once Lamictal start working for her anxiety.  Recommended to call us back if she is feel worsening of the symptoms.  Discussed safety concern that any time having active suicidal thoughts or homicidal thought that she need to call 911 or go to local emergency room.  Follow-up in 3 months.   Cleotis Nipper, MD 04/04/2017, 3:47 PM

## 2017-04-17 DEATH — deceased

## 2017-07-04 ENCOUNTER — Ambulatory Visit (HOSPITAL_COMMUNITY): Payer: Self-pay | Admitting: Psychiatry

## 2017-10-25 ENCOUNTER — Telehealth: Payer: Self-pay

## 2017-10-25 NOTE — Telephone Encounter (Signed)
Called and regarding below message asking her to call the office back.

## 2017-10-25 NOTE — Telephone Encounter (Signed)
-----   Message from Artis Delay, MD sent at 10/25/2017  8:34 AM EDT ----- Regarding: follow-up I treated her husband as well, died from MDS The last time I saw her she was very tearful. The [facility] brought back a lot of memories I offered her to transfer care to [facility] office Can you call her and ask if that is OK?

## 2017-10-25 NOTE — Telephone Encounter (Signed)
-----   Message from Ni Gorsuch, MD sent at 10/25/2017  8:34 AM EDT ----- Regarding: follow-up I treated her husband as well, died from MDS The last time I saw her she was very tearful. The [facility] brought back a lot of memories I offered her to transfer care to [facility] office Can you call her and ask if that is OK? 

## 2017-10-25 NOTE — Telephone Encounter (Signed)
Called and left a message asking her to call the nurse back regarding below message.

## 2018-07-01 IMAGING — CR DG CHEST 2V
2 series · 2 of 2 positions shown · non-contrast
Comparison: 11/03/2008 .

CLINICAL DATA: Total knee replacement.

EXAM:
CHEST  2 VIEW

[w chest pa]
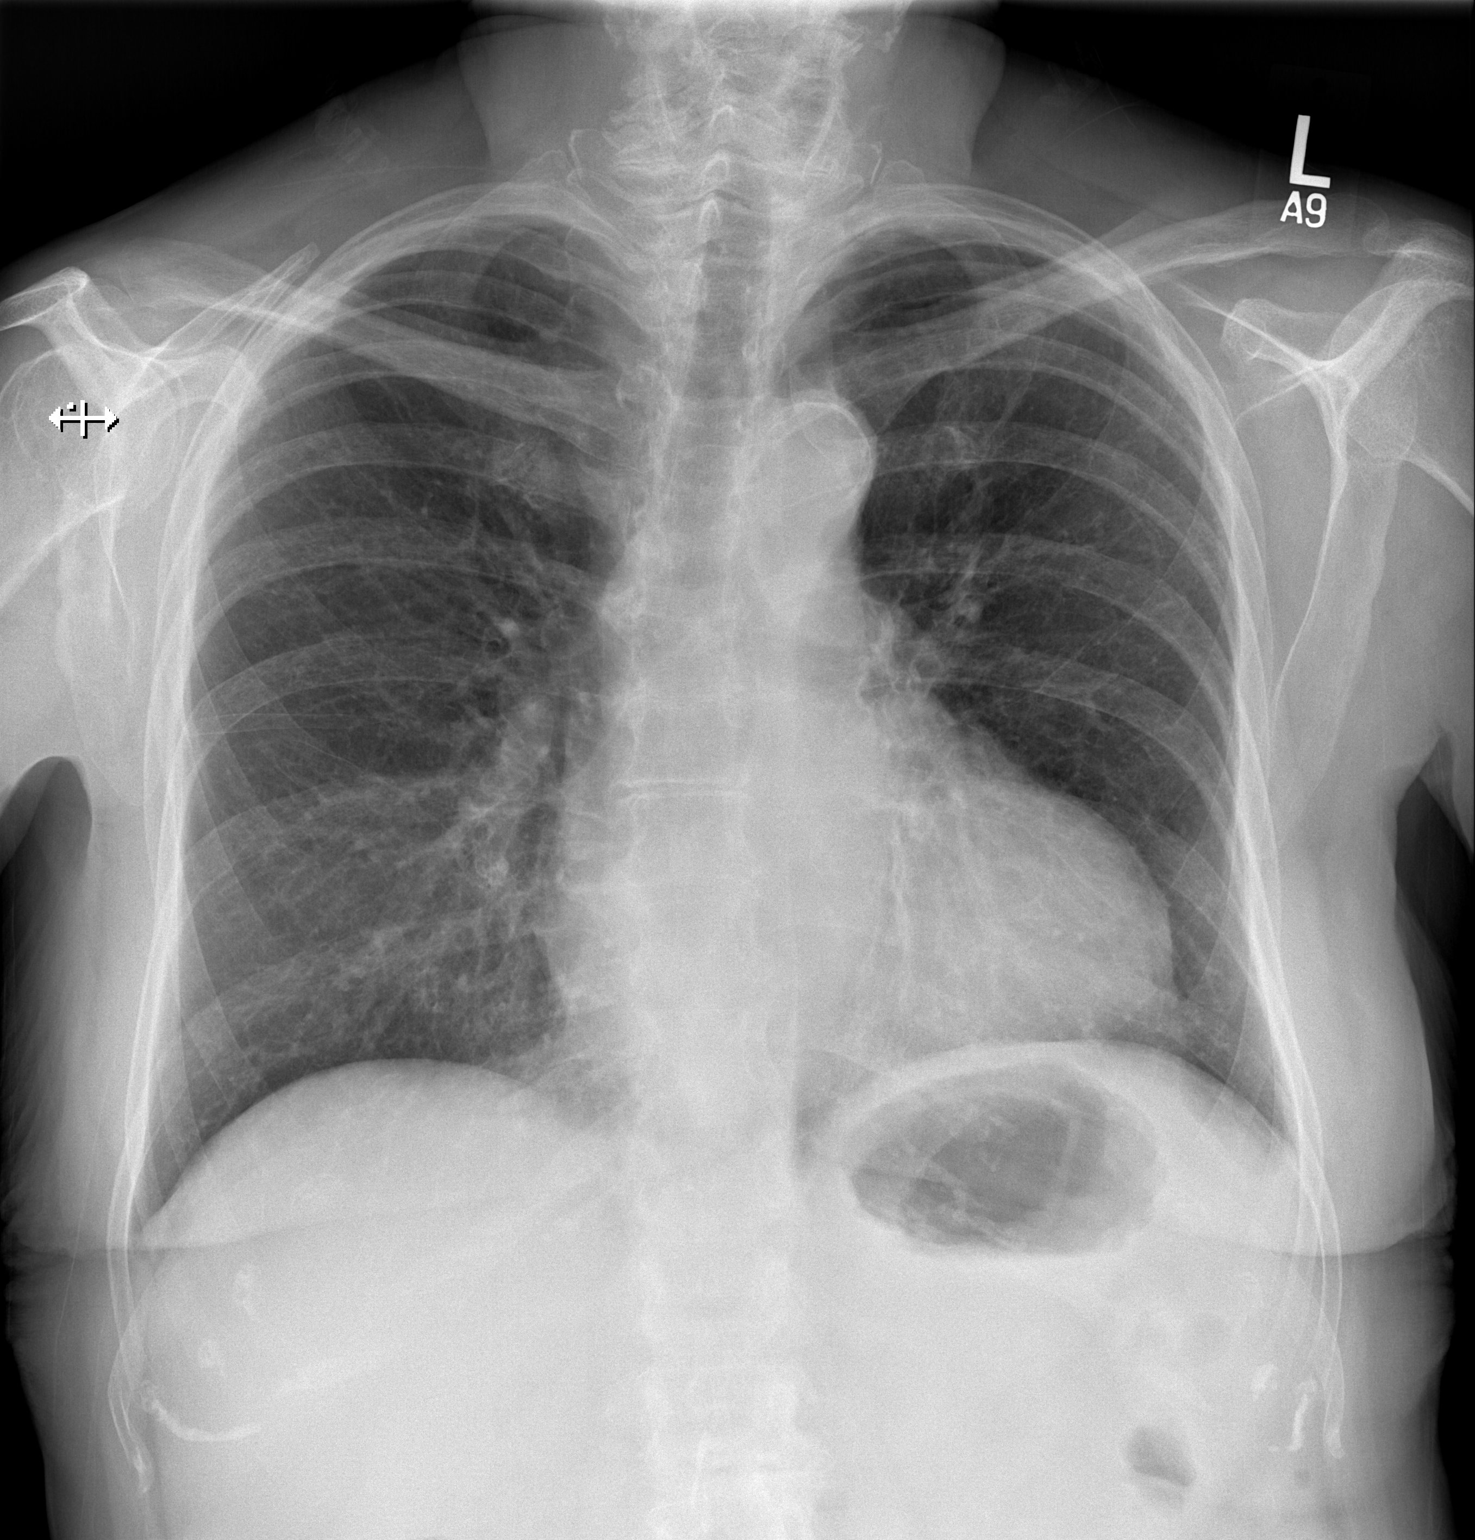

[w chest lat]
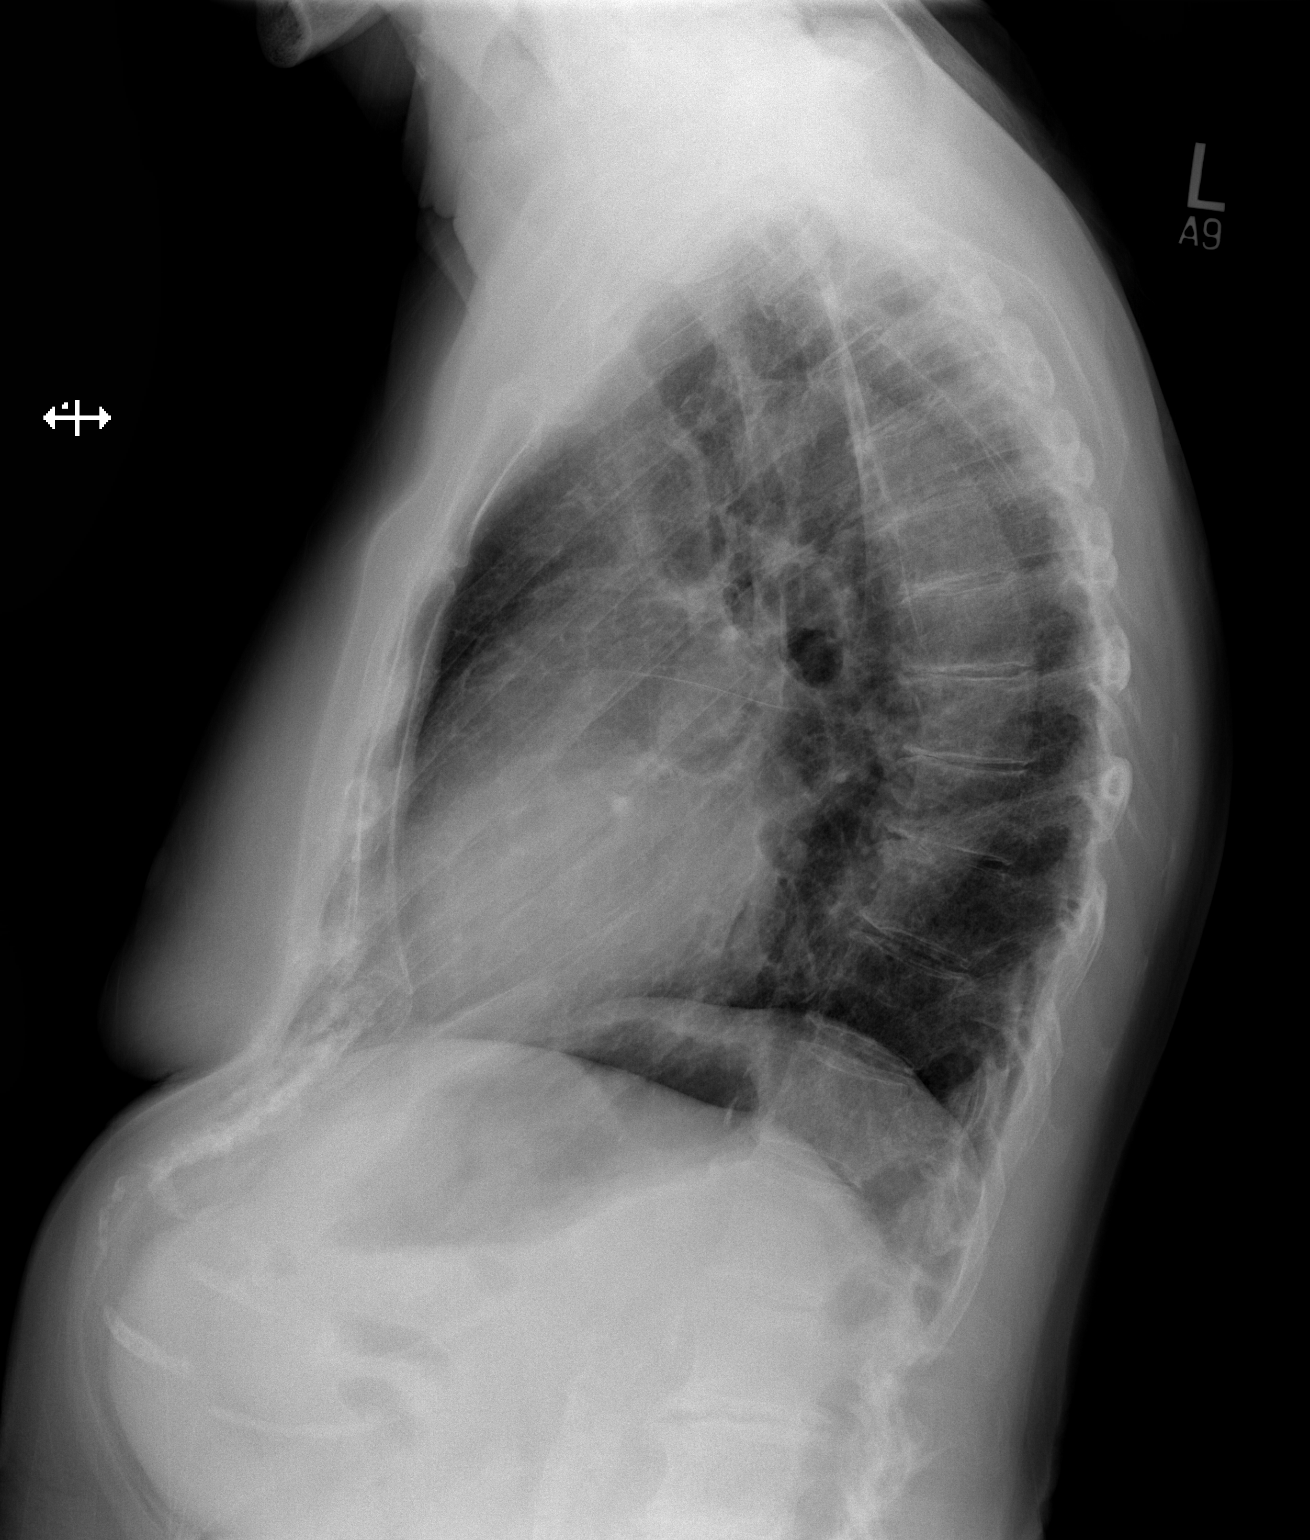

[2 of 2 positions shown; findings below may reference images not displayed]

FINDINGS: Mediastinum and hilar structures normal. Lungs are clear.
Cardiomegaly with normal pulmonary vascularity. Small left pleural
effusion. No pneumothorax. Degenerative changes thoracic spine.
Pectus deformity noted .
IMPRESSION: 1.  Small left pleural effusion.

2. Cardiomegaly, no pulmonary venous congestion.

## 2019-04-09 IMAGING — CR DG ABDOMEN ACUTE W/ 1V CHEST
3 series · 3 of 3 positions shown · non-contrast
Comparison: CT 07/10/2016

CLINICAL DATA: Abdominal pain for several weeks, worsened over the
past few days.

EXAM:
DG ABDOMEN ACUTE W/ 1V CHEST

[x chest ap]
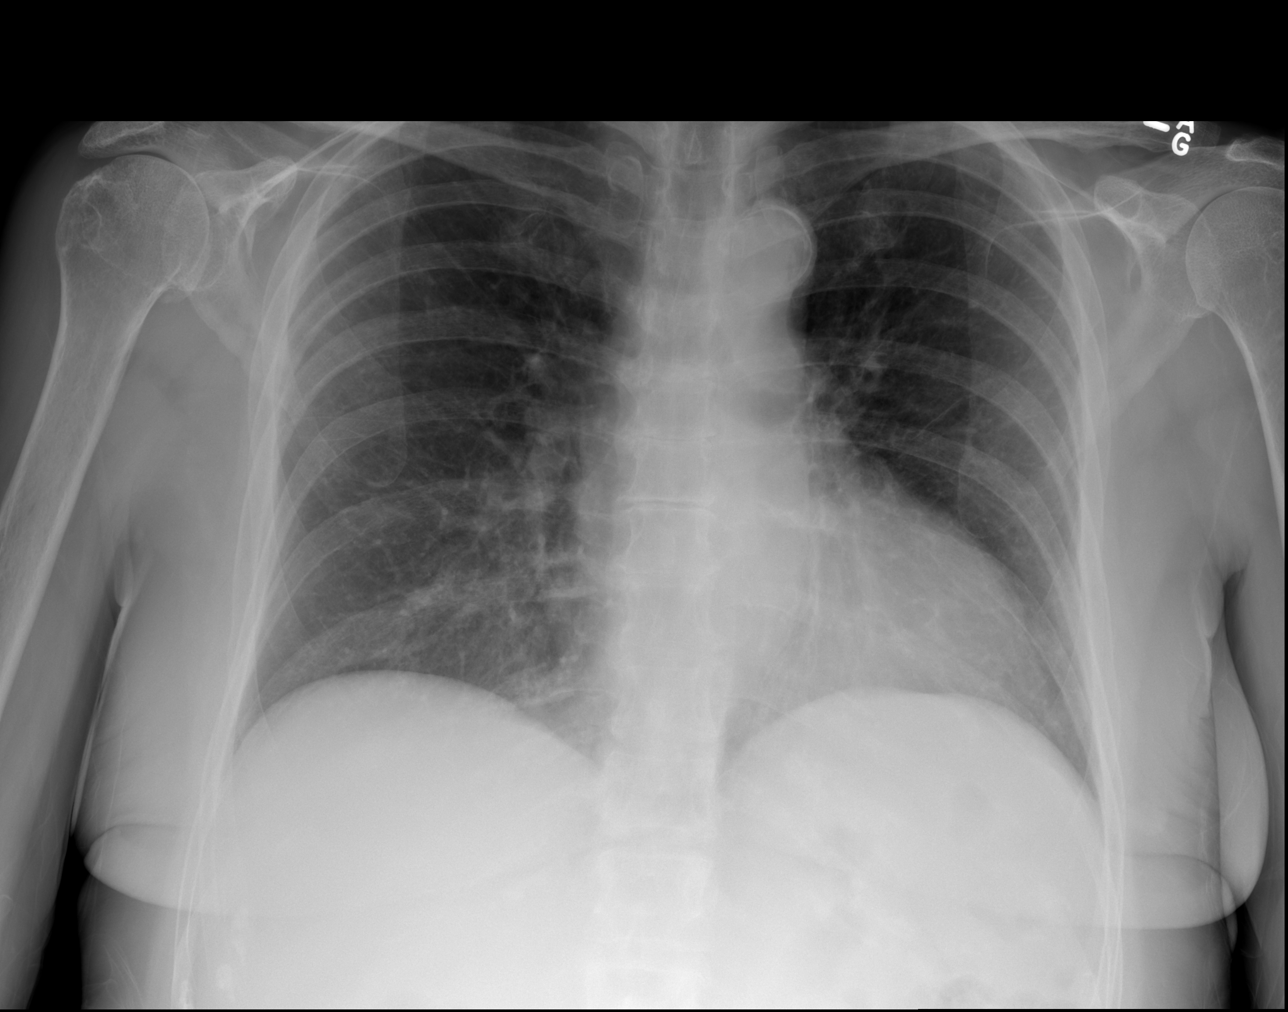

[x abdomen supine]
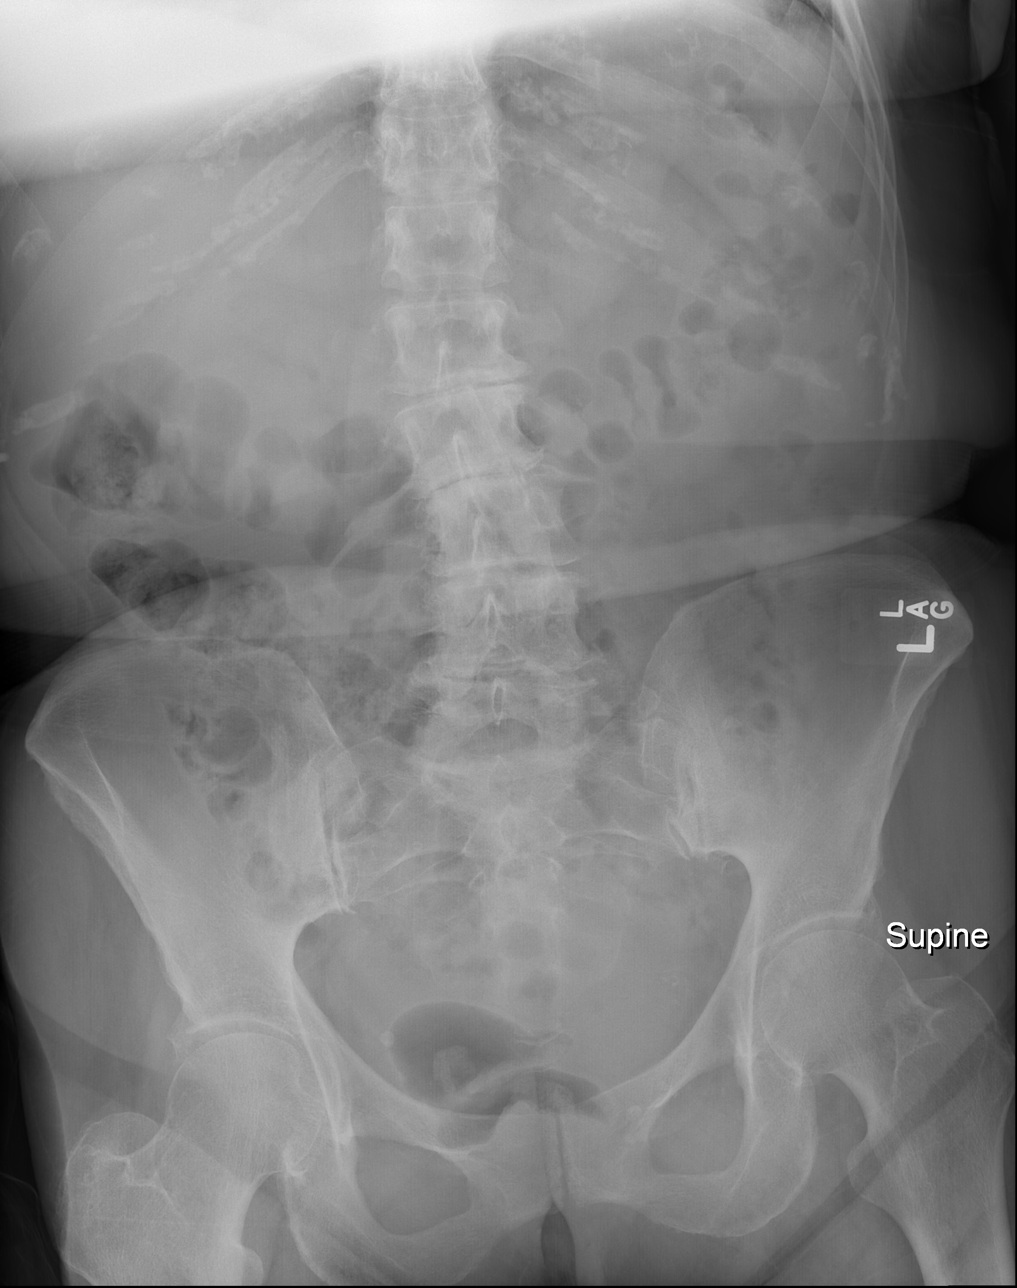

[w abdomen decub]
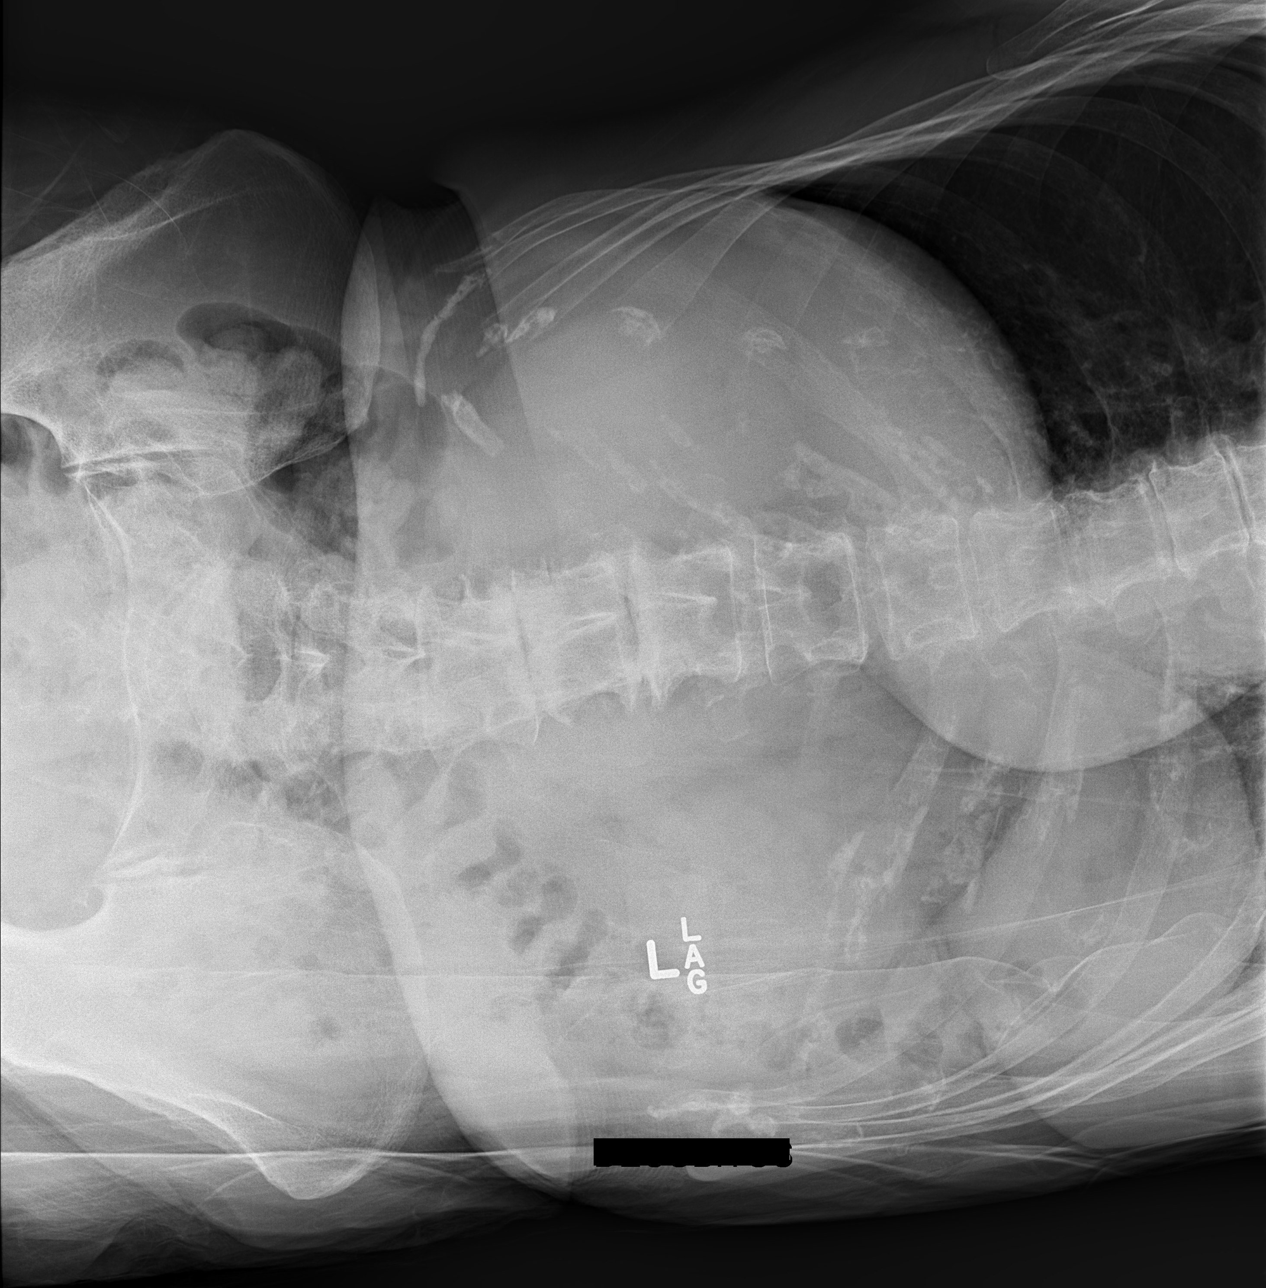

[3 of 3 positions shown; findings below may reference images not displayed]

FINDINGS: There is no evidence of dilated bowel loops or free intraperitoneal
air. No radiopaque calculi or other significant radiographic
abnormality is seen. Heart size and mediastinal contours are within
normal limits. Both lungs are clear.
IMPRESSION: Negative abdominal radiographs.  No acute cardiopulmonary disease.

## 2019-04-11 IMAGING — DX DG UGI W/ SMALL BOWEL
1 series · 1 of 1 positions shown · non-contrast
Comparison: None.

CLINICAL DATA: Epigastric pain and some constipation for 6 months.

EXAM:
UPPER GI SERIES WITH SMALL BOWEL FOLLOW-THROUGH
FLUOROSCOPY TIME:  Fluoroscopy Time:  2 minutes and 49 seconds
62.05 mGy
TECHNIQUE: Combined double contrast and single contrast upper GI series using
effervescent crystals, thick barium, and thin barium. Subsequently,
serial images of the small bowel were obtained including spot views
of the terminal ileum.

[abdomen kub]
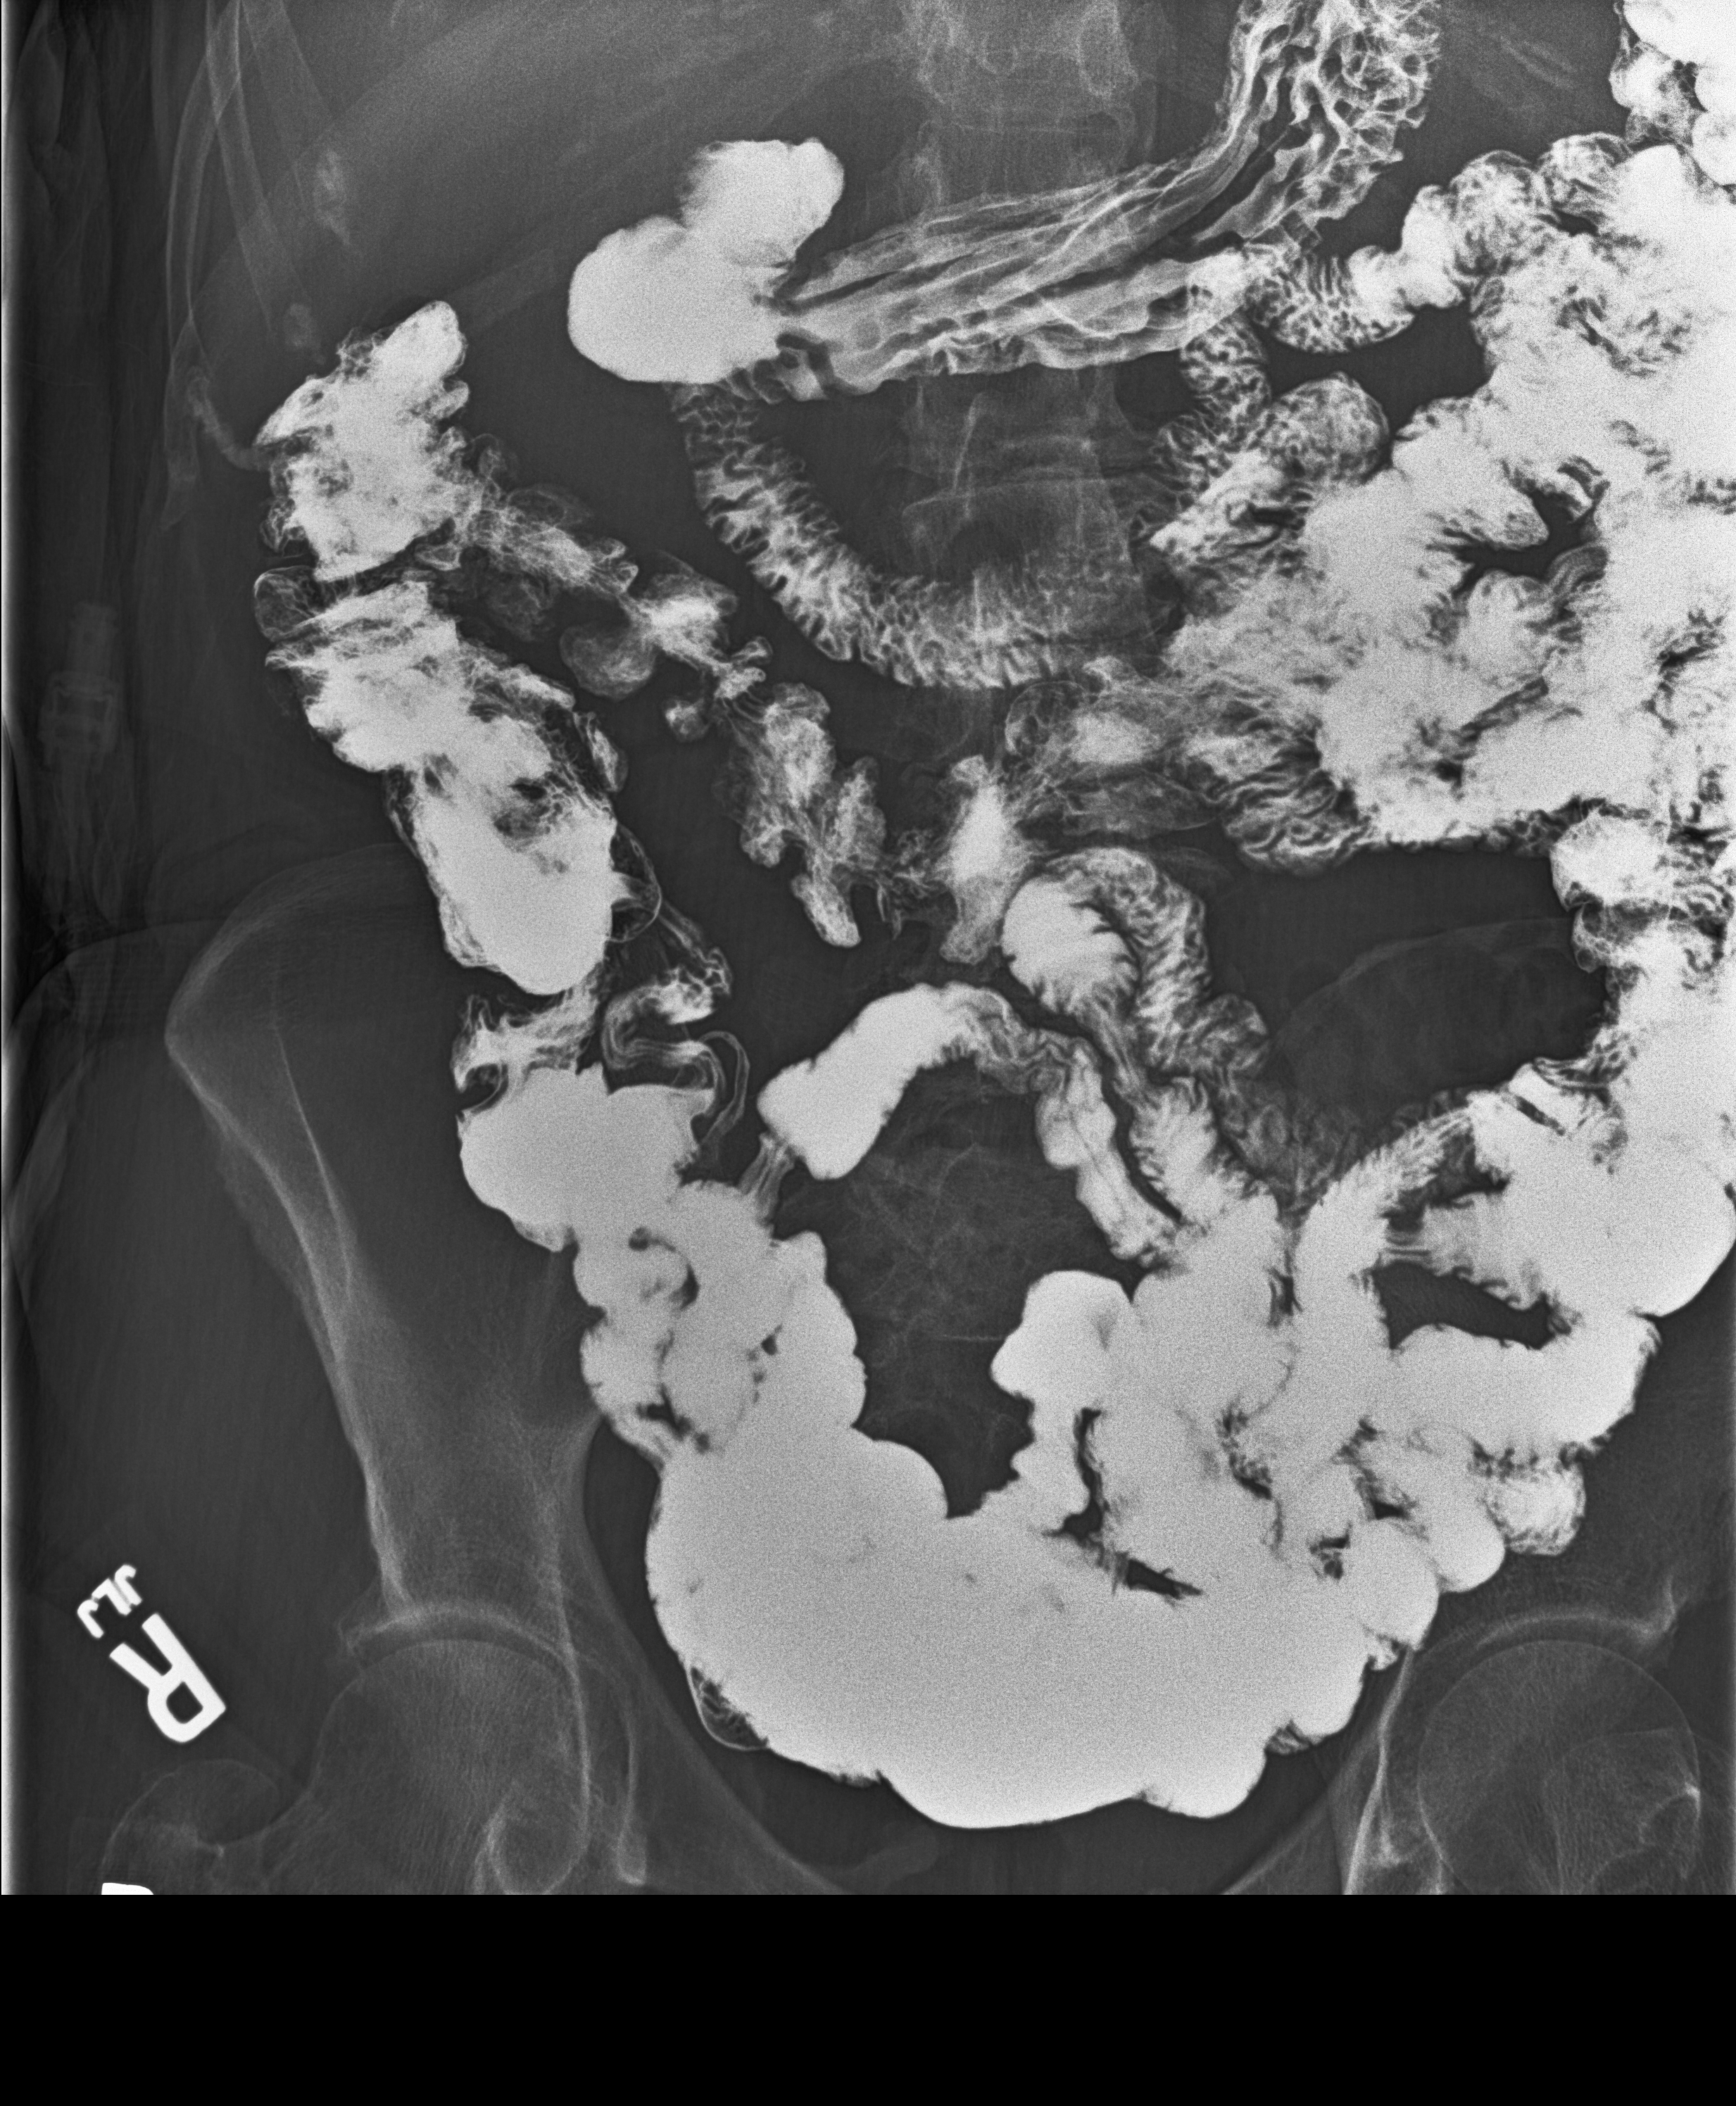

[1 of 1 positions shown; findings below may reference images not displayed]

FINDINGS: Scout KUB was obtained showing a nonobstructive bowel gas pattern.
No evidence of soft tissue mass or abnormal fluid collection. No
evidence of free intraperitoneal air. Degenerative changes are seen
throughout the scoliotic thoracolumbar spine, moderate in degree.
Atherosclerotic changes noted at the aortic arch.

Serial oral boluses of thick and thin consistency barium liquid were
administered during fluoroscopic evaluation. Thoracic esophagus
appeared normal in caliber and configuration without mass,
ulceration or other focal wall irregularity. Contrast moved promptly
through the esophagus and into the stomach without obstruction or
evidence of dysmotility.

Stomach appeared normal in caliber and configuration without mass,
ulceration or other focal wall irregularity. No hiatal hernia
elicited despite Valsalva maneuvers. Contrast moved promptly from
the stomach into the small bowel without obstruction or evidence of
dysmotility.

Small bowel appeared normal in caliber and configuration throughout.
Fold pattern appeared normal throughout without mass, ulceration or
other focal wall irregularity identified. Contrast moved promptly to
the colon within 40 minutes with no obstruction or evidence of
dysmotility.
IMPRESSION: Normal upper GI and small-bowel follow-through examination.

Aortic atherosclerosis.

Degenerative changes of the scoliotic thoracolumbar spine, moderate
in degree.
